# Patient Record
Sex: Female | Born: 1955
Health system: Southern US, Community
[De-identification: ages and names within clinical notes are randomized; demographics above are authoritative.]

## PROBLEM LIST (undated history)

## (undated) DIAGNOSIS — E11319 Type 2 diabetes mellitus with unspecified diabetic retinopathy without macular edema: Secondary | ICD-10-CM

## (undated) DIAGNOSIS — E7800 Pure hypercholesterolemia, unspecified: Secondary | ICD-10-CM

## (undated) DIAGNOSIS — I10 Essential (primary) hypertension: Secondary | ICD-10-CM

## (undated) HISTORY — PX: BREAST CYST EXCISION: SHX579

## (undated) HISTORY — DX: Type 2 diabetes mellitus with unspecified diabetic retinopathy without macular edema: E11.319

## (undated) HISTORY — DX: Pure hypercholesterolemia, unspecified: E78.00

## (undated) HISTORY — PX: TUBAL LIGATION: SHX77

## (undated) HISTORY — PX: BREAST SURGERY: SHX581

## (undated) HISTORY — PX: REDUCTION MAMMAPLASTY: SUR839

## (undated) HISTORY — PX: TONSILLECTOMY: SUR1361

---

## 1998-03-25 ENCOUNTER — Encounter: Payer: Self-pay | Admitting: Emergency Medicine

## 1998-03-25 ENCOUNTER — Emergency Department (HOSPITAL_COMMUNITY): Admission: EM | Admit: 1998-03-25 | Discharge: 1998-03-25 | Payer: Self-pay | Admitting: Emergency Medicine

## 1998-10-20 ENCOUNTER — Other Ambulatory Visit: Admission: RE | Admit: 1998-10-20 | Discharge: 1998-10-20 | Payer: Self-pay

## 1998-11-14 ENCOUNTER — Ambulatory Visit (HOSPITAL_COMMUNITY): Admission: RE | Admit: 1998-11-14 | Discharge: 1998-11-14 | Payer: Self-pay | Admitting: Obstetrics and Gynecology

## 1998-11-14 ENCOUNTER — Encounter: Payer: Self-pay | Admitting: Obstetrics and Gynecology

## 1999-02-19 ENCOUNTER — Ambulatory Visit (HOSPITAL_BASED_OUTPATIENT_CLINIC_OR_DEPARTMENT_OTHER): Admission: RE | Admit: 1999-02-19 | Discharge: 1999-02-19 | Payer: Self-pay | Admitting: Specialist

## 1999-09-24 ENCOUNTER — Encounter: Admission: RE | Admit: 1999-09-24 | Discharge: 1999-12-23 | Payer: Self-pay | Admitting: Internal Medicine

## 2000-04-14 ENCOUNTER — Encounter: Admission: RE | Admit: 2000-04-14 | Discharge: 2000-04-14 | Payer: Self-pay | Admitting: Internal Medicine

## 2000-04-14 ENCOUNTER — Encounter: Payer: Self-pay | Admitting: Internal Medicine

## 2000-08-12 ENCOUNTER — Encounter: Payer: Self-pay | Admitting: Internal Medicine

## 2000-08-12 ENCOUNTER — Ambulatory Visit (HOSPITAL_COMMUNITY): Admission: RE | Admit: 2000-08-12 | Discharge: 2000-08-12 | Payer: Self-pay | Admitting: Internal Medicine

## 2000-12-11 ENCOUNTER — Encounter: Admission: RE | Admit: 2000-12-11 | Discharge: 2001-02-04 | Payer: Self-pay | Admitting: Internal Medicine

## 2001-01-05 ENCOUNTER — Other Ambulatory Visit: Admission: RE | Admit: 2001-01-05 | Discharge: 2001-01-05 | Payer: Self-pay | Admitting: Obstetrics and Gynecology

## 2001-09-14 ENCOUNTER — Encounter: Payer: Self-pay | Admitting: Internal Medicine

## 2001-09-14 ENCOUNTER — Encounter: Admission: RE | Admit: 2001-09-14 | Discharge: 2001-09-14 | Payer: Self-pay | Admitting: Internal Medicine

## 2001-11-16 ENCOUNTER — Encounter: Admission: RE | Admit: 2001-11-16 | Discharge: 2002-02-14 | Payer: Self-pay | Admitting: Internal Medicine

## 2002-01-06 ENCOUNTER — Other Ambulatory Visit: Admission: RE | Admit: 2002-01-06 | Discharge: 2002-01-06 | Payer: Self-pay | Admitting: Obstetrics and Gynecology

## 2005-08-09 ENCOUNTER — Other Ambulatory Visit: Admission: RE | Admit: 2005-08-09 | Discharge: 2005-08-09 | Payer: Self-pay | Admitting: Obstetrics and Gynecology

## 2005-08-20 ENCOUNTER — Ambulatory Visit (HOSPITAL_COMMUNITY): Admission: RE | Admit: 2005-08-20 | Discharge: 2005-08-20 | Payer: Self-pay | Admitting: Internal Medicine

## 2006-04-18 ENCOUNTER — Observation Stay (HOSPITAL_COMMUNITY): Admission: EM | Admit: 2006-04-18 | Discharge: 2006-04-19 | Payer: Self-pay | Admitting: Family Medicine

## 2006-09-28 ENCOUNTER — Emergency Department (HOSPITAL_COMMUNITY): Admission: EM | Admit: 2006-09-28 | Discharge: 2006-09-28 | Payer: Self-pay | Admitting: Emergency Medicine

## 2007-02-28 ENCOUNTER — Emergency Department (HOSPITAL_COMMUNITY): Admission: EM | Admit: 2007-02-28 | Discharge: 2007-02-28 | Payer: Self-pay | Admitting: Family Medicine

## 2007-08-20 ENCOUNTER — Emergency Department (HOSPITAL_COMMUNITY): Admission: EM | Admit: 2007-08-20 | Discharge: 2007-08-20 | Payer: Self-pay | Admitting: Family Medicine

## 2007-08-25 ENCOUNTER — Emergency Department (HOSPITAL_COMMUNITY): Admission: EM | Admit: 2007-08-25 | Discharge: 2007-08-25 | Payer: Self-pay | Admitting: Family Medicine

## 2007-09-20 IMAGING — CT CT HEAD W/O CM
1 of 2 series · 13 of 30 positions shown, 17 images · IV contrast (agent unspecified)
Comparison: 09/14/01 CT from [REDACTED].

CLINICAL DATA: Hypertension.  Headache. 
HEAD CT WITHOUT CONTRAST:
TECHNIQUE: Contiguous axial images were obtained from the base of the skull through the vertex according to standard protocol without contrast.

[Series 2: brain · axial · 0.47mm/px · z∈[+143,+275]mm · 13 of 32 slices shown, 17 images]
[im 3/32  brain]
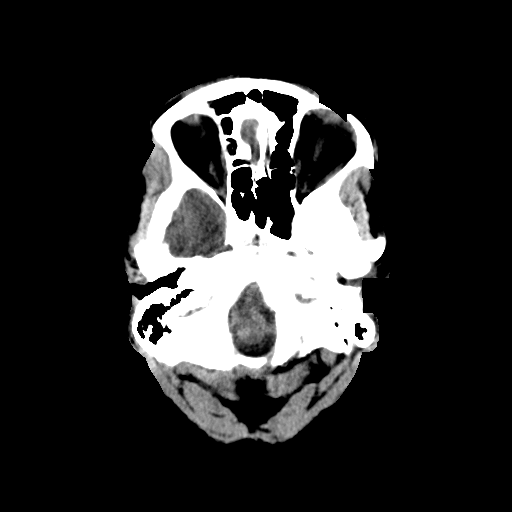
[im 3/32  bone]
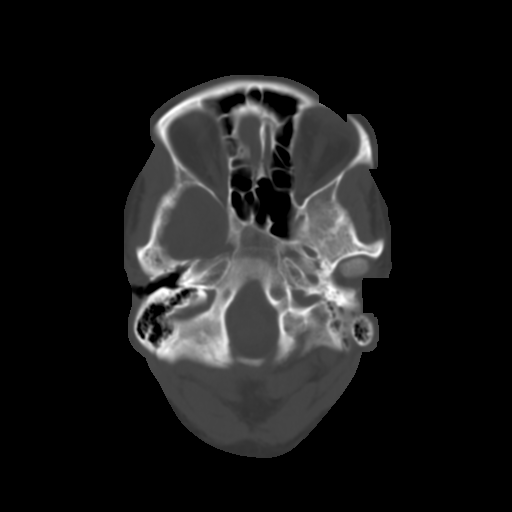
[im 5/32  brain]
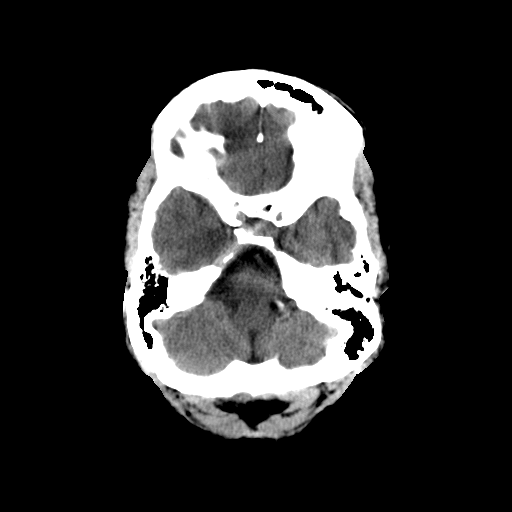
[im 7/32  brain]
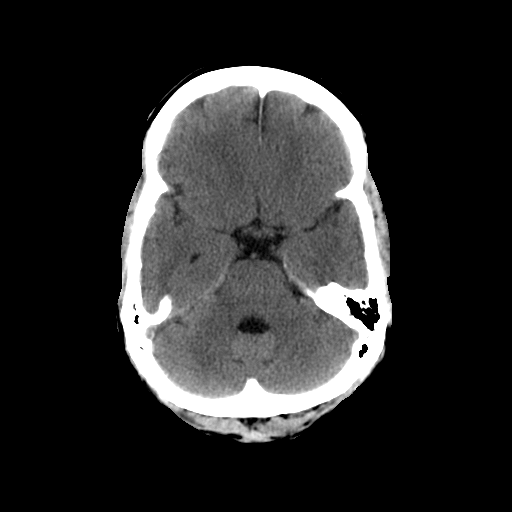
[im 9/32  brain]
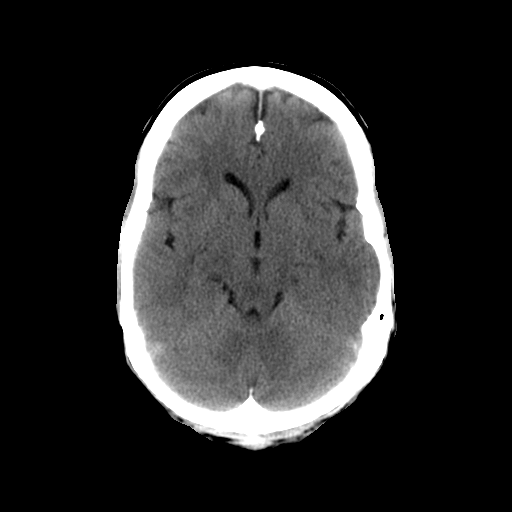
[im 12/32  brain]
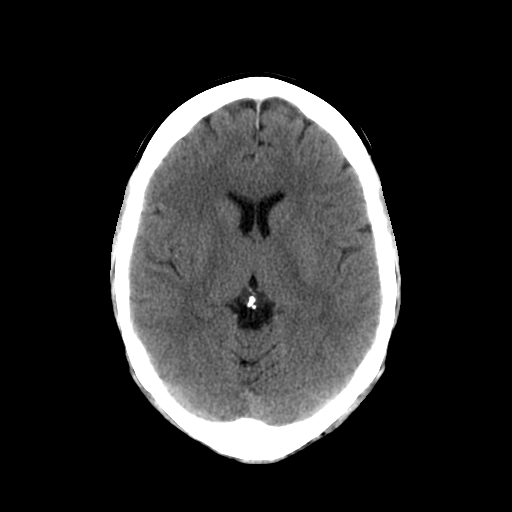
[im 12/32  bone]
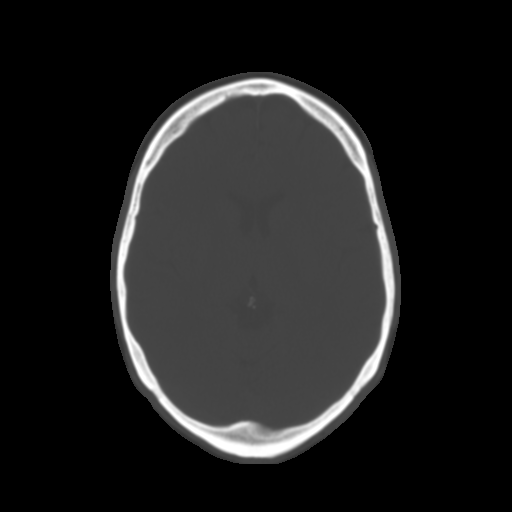
[im 14/32  brain]
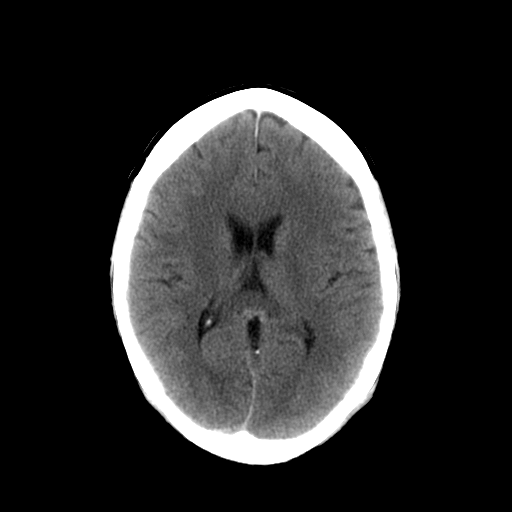
[im 16/32  brain]
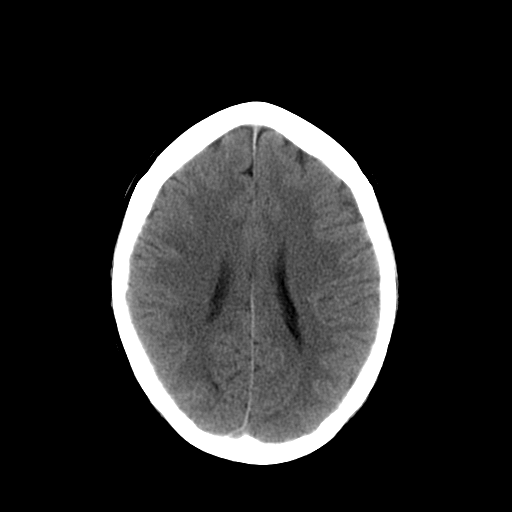
[im 18/32  brain]
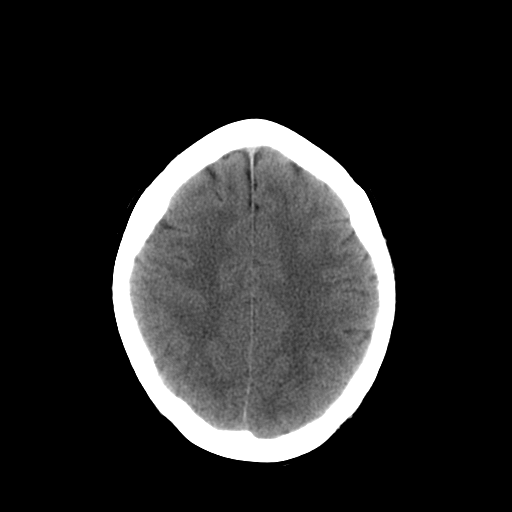
[im 20/32  brain]
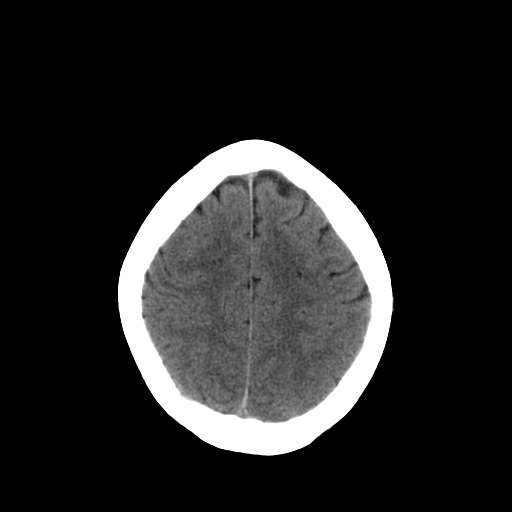
[im 20/32  bone]
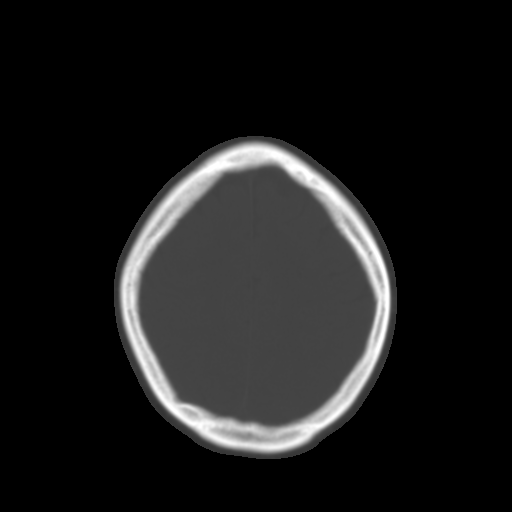
[im 23/32  brain]
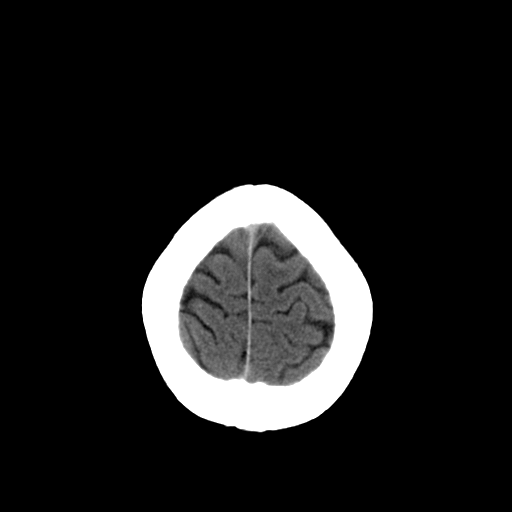
[im 25/32  brain]
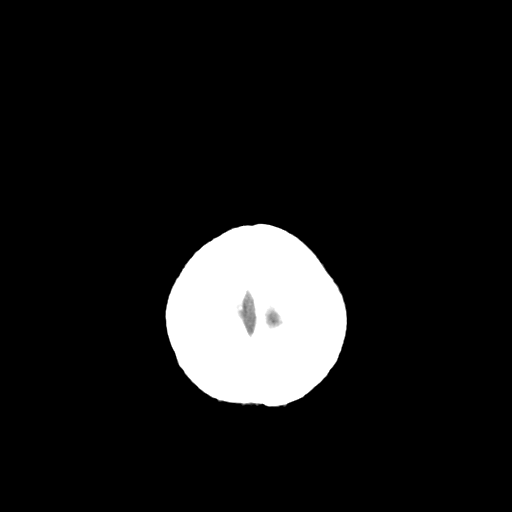
[im 27/32  brain]
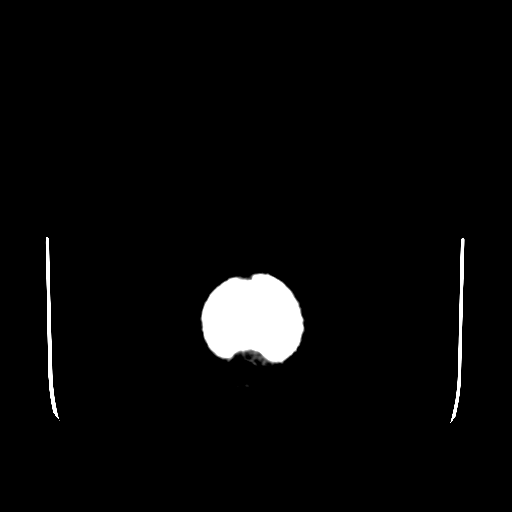
[im 29/32  brain]
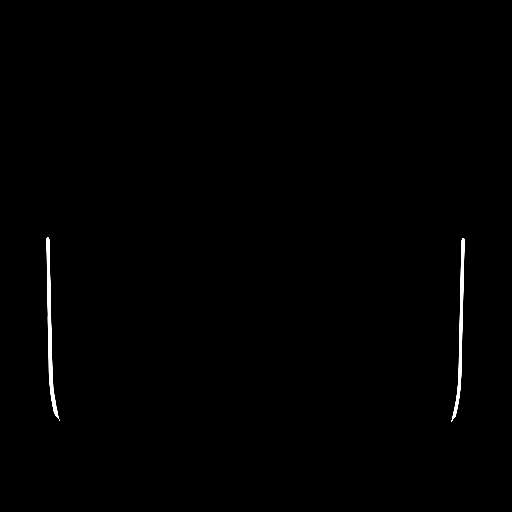
[im 29/32  bone]
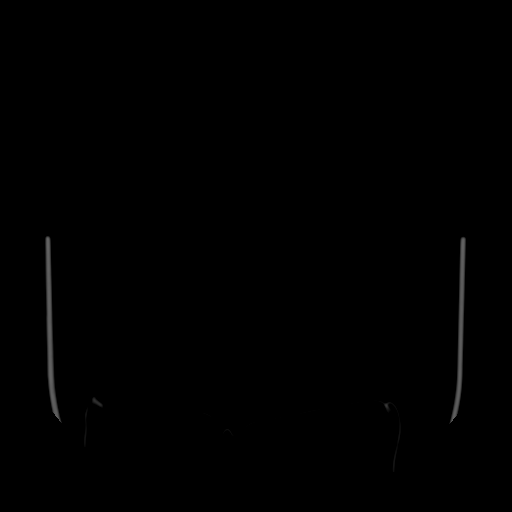

[13 of 30 positions shown; findings below may reference images not displayed]

FINDINGS: There is no evidence of intracranial hemorrhage, brain edema, acute infarct, mass lesion, or mass effect.  No other intra-axial abnormalities are seen, and the ventricles are within normal limits.  No abnormal extra-axial fluid collections or masses are identified.  No skull abnormalities are noted.
IMPRESSION: Negative non-contrast head CT.

## 2008-02-08 ENCOUNTER — Emergency Department (HOSPITAL_COMMUNITY): Admission: EM | Admit: 2008-02-08 | Discharge: 2008-02-08 | Payer: Self-pay | Admitting: Emergency Medicine

## 2008-02-08 ENCOUNTER — Ambulatory Visit (HOSPITAL_COMMUNITY): Admission: RE | Admit: 2008-02-08 | Discharge: 2008-02-08 | Payer: Self-pay | Admitting: Emergency Medicine

## 2008-11-17 ENCOUNTER — Ambulatory Visit (HOSPITAL_COMMUNITY): Admission: RE | Admit: 2008-11-17 | Discharge: 2008-11-17 | Payer: Self-pay | Admitting: Internal Medicine

## 2009-03-20 ENCOUNTER — Emergency Department (HOSPITAL_COMMUNITY): Admission: EM | Admit: 2009-03-20 | Discharge: 2009-03-20 | Payer: Self-pay | Admitting: Family Medicine

## 2009-03-21 ENCOUNTER — Emergency Department (HOSPITAL_COMMUNITY): Admission: EM | Admit: 2009-03-21 | Discharge: 2009-03-21 | Payer: Self-pay | Admitting: Emergency Medicine

## 2009-03-23 ENCOUNTER — Emergency Department (HOSPITAL_COMMUNITY): Admission: EM | Admit: 2009-03-23 | Discharge: 2009-03-23 | Payer: Self-pay | Admitting: Emergency Medicine

## 2010-07-29 ENCOUNTER — Encounter: Payer: Self-pay | Admitting: Obstetrics and Gynecology

## 2010-10-12 LAB — CULTURE, ROUTINE-ABSCESS: Gram Stain: NONE SEEN

## 2010-11-23 NOTE — H&P (Signed)
Sharon Mcdonald, Sharon Mcdonald NO.:  [id number]   MEDICAL RECORD NO.:  [id number]          PATIENT TYPE:  EMS   LOCATION:  [facility]                         FACILITY:  [facility]   PHYSICIAN:  Michaelyn Barter, M.D. DATE OF BIRTH:  05-05-1956   DATE OF ADMISSION:  04/18/2006  DATE OF DISCHARGE:                                HISTORY & PHYSICAL   PRIMARY CARE PHYSICIAN:  Robyn N. Allyne Gee, M.D.   CHIEF COMPLAINT:  Chest pain and elevated blood pressure.   HISTORY OF PRESENT ILLNESS:  Sharon Mcdonald is a 55 year old female with a past  medical history of diabetes mellitus who states that at approximately 9:00  a.m. while at work today and while sitting at her desk, she began to feel  ill.  She went on to state that she actually went to see her primary care  physician this past Monday for a checkup.  At that time, her blood pressure  was found to be elevated.  She was started on lisinopril.  She also  developed a headache at that particular time.  Since Monday, she has  experienced a headache each day.  She began to take Aleve on Wednesday as a  result.  This morning, while at work at approximately 9:00 in the morning,  she began to feel nauseated and had a headache.  In addition, she developed  left-sided pain underneath her left breast which also involved her left  shoulder and the left back region.  Her blood pressure was checked and was  found to be elevated at 177/108.  She called her primary care physician and  was referred to [facility].  After arriving at [facility] and being evaluated, she was sent to the ER for further workup.  The  patient describes her left-sided chest pain/pain underneath her breast as  being a dull sensation, approximately 3/10.  She has never had similar pain  before.  Movement aggravates the pain.  Sublingual nitroglycerin was given  to her in an the ER, and it helped to cause her pain to subside.  She also  complains of some  diaphoresis during the episode.  She currently states that  the pain has resolved since being given medication in the ER.  She denies  having any shortness of breath.  No symptoms suggestive of orthopnea or PND.  She has never had any type of cardiac evaluation, never had any stress test  completed.   PAST MEDICAL HISTORY:  1. Diabetes mellitus type 2, diagnosed in 2002.  2. The patient denies ever being told that she had hypertension.  However,      she was started on lisinopril this past Monday.  Therefore, it appears      that she may be a newly diagnosed hypertensive patient.   PAST SURGICAL HISTORY:  1. Hysterectomy secondary to fibroids approximately 15 years ago.  2. Left breast tumor excised from the left side, found to be benign.  3. Right small toe corn removed.   ALLERGIES:  PENICILLIN causes a rash.   HOME MEDICATIONS:  1. Actos plus metformin  15/850.  2. Lisinopril.  She started taking this Monday.   SOCIAL HISTORY:  Cigarettes:  The patient stopped smoking at the age of 55.  She started smoking at the age of 48.  She had smoked 3-4 sticks per day.  Alcohol:  She stopped using at the age of 82.  She drank only occasionally  on the weekends prior to that.  Powder cocaine:  The patient states that she  started using cocaine while in her 26s.  She has not used any cocaine for at  least 15 years.  Employment:  The patient works as an Runner, broadcasting/film/video  at [employer].   FAMILY HISTORY:  Mother deceased.  She had a history of diabetes mellitus  and hypertension.  Father:  Medical history is unknown.  There is no family  history of heart disease.   REVIEW OF SYSTEMS:  Positive headache.  Negative cough.  No abdominal pain,  no dysuria, no hematuria.  Negative bowel movement changes.   PHYSICAL EXAMINATION:  GENERAL:  The patient is awake, cooperative.  She is  in no obvious distress.  VITAL SIGNS:  Blood pressure at the time of admission was  187/123,  temperature 98.5, heart rate 96, respirations 12, O2 saturation 100%.  HEENT:  Normocephalic, atraumatic.  Anicteric.  Extraocular movements are  intact.  Pupils are equally reactive to light.  NECK:  Supple.  No JVD.  Strong carotids bilaterally.  No bruits.  No  lymphadenopathy.  Thyroid not palpable.  CARDIAC:  S1, S2 present.  Regular rate and rhythm.  No murmurs, no gallops,  no rubs.  RESPIRATORY:  No crackles or wheezes.  ABDOMEN:  Soft, nontender, nondistended.  Positive bowel sounds x4  quadrants.  No masses.  EXTREMITIES:  No leg edema.  NEUROLOGIC:  The patient is alert and oriented x3.  Cranial nerves II-XII  are intact.  MUSCULOSKELETAL:  5/5 upper and lower extremity strength.   LABORATORIES:  pH 7.354, PCO2 40.8, bicarbonate 22.7.  Hemoglobin 13.3,  hematocrit 39.  D-dimer is less than 0.22.  CK-MB, POC less than 1 x3.  Troponin I, POC less than 0.05 x3.  Myoglobin, POC 28, 32.2, 28.4.  EKG  reveals normal sinus rhythm, no Q waves, no ST segment changes.   ASSESSMENT AND PLAN:  1. Chest pain.  The etiology is cardiac versus noncardiac.  The patient's      initial workup thus far is negative with regard to the patient's EKG      and her point of care markers.  The patient's blood pressure was      significantly elevated.  This may have contributed to her symptoms of      chest pain.  She does, however, have cardiac risk factors which are      multiple, including obesity, a remote history of cigarette smoking,      diabetes mellitus, and what appears to be newly diagnosed hypertension      which was uncontrolled.  Will cycle the patient's cardiac enzymes.      Will start aspirin therapy, check a 2D echocardiogram.  Will also      consider consultation with cardiology.  The patient may benefit from a      stress test, and this may be done either as an inpatient or outpatient.     In addition, we will check a fasting lipid profile.  Will check a chest       x-ray.  2. Uncontrolled hypertension.  The patient denies having a prior formal      diagnosis of hypertension.  However, I suspect that this may have been      present for a while.  The patient was started on lisinopril only 4 days      ago.  Will add a beta blocker to this.  3. Left side shoulder pain.  This could have been associated with the      chest pain that the patient had.  Will check an x-ray of the left      shoulder.  4. Gastrointestinal prophylaxis.  Will provide Protonix.     Michaelyn Barter, M.D.  Electronically Signed    OR/MEDQ  D:  04/18/2006  T:  04/18/2006  Job:  [id number]

## 2011-05-29 ENCOUNTER — Encounter: Payer: Self-pay | Admitting: *Deleted

## 2011-05-29 ENCOUNTER — Emergency Department (HOSPITAL_COMMUNITY)
Admission: EM | Admit: 2011-05-29 | Discharge: 2011-05-29 | Disposition: A | Discharge status: Home / Self Care | Payer: 59 | Source: Home / Self Care | Attending: Family Medicine | Admitting: Family Medicine

## 2011-05-29 ENCOUNTER — Emergency Department (INDEPENDENT_AMBULATORY_CARE_PROVIDER_SITE_OTHER): Payer: 59

## 2011-05-29 DIAGNOSIS — M79674 Pain in right toe(s): Secondary | ICD-10-CM

## 2011-05-29 DIAGNOSIS — M12879 Other specific arthropathies, not elsewhere classified, unspecified ankle and foot: Secondary | ICD-10-CM

## 2011-05-29 DIAGNOSIS — M79609 Pain in unspecified limb: Secondary | ICD-10-CM

## 2011-05-29 HISTORY — DX: Essential (primary) hypertension: I10

## 2011-05-29 MED ORDER — NAPROXEN 500 MG PO TABS: 500.0000 mg | ORAL_TABLET | Freq: Two times a day (BID) | ORAL | Status: AC

## 2011-05-29 MED ORDER — HYDROCODONE-ACETAMINOPHEN 5-325 MG PO TABS: ORAL_TABLET | ORAL | Status: AC

## 2011-05-29 NOTE — ED Provider Notes (Signed)
History     CSN: [id number] Arrival date & time: 05/29/2011 11:58 AM   First MD Initiated Contact with Patient 05/29/11 1203      Chief Complaint  Patient presents with  . Foot Pain    (Consider location/radiation/quality/duration/timing/severity/associated sxs/prior treatment) HPI Comments: Sharon Mcdonald presents for evaluation of acute onset of pain in her RIGHT GREAT toe, over the last week. She denies any injury or hx of gout. She denies any new shoes or activity. It is tender around the 1st MTP joint; it is not red, swollen, or hot.   Patient is a 55 y.o. female presenting with lower extremity pain. The history is provided by the patient.  Foot Pain This is a new problem. The current episode started more than 1 week ago. The problem occurs constantly. The problem has been gradually worsening. The symptoms are aggravated by walking, exertion and standing. The symptoms are relieved by nothing. She has tried nothing for the symptoms.    Past Medical History  Diagnosis Date  . Diabetes mellitus   . Hypertension     Past Surgical History  Procedure Date  . Tubal ligation   . Breast surgery   . Tonsillectomy     Family History  Problem Relation Age of Onset  . Diabetes Mother   . Hypertension Mother     History  Substance Use Topics  . Smoking status: Never Smoker   . Smokeless tobacco: Not on file  . Alcohol Use: No    OB History    Grav Para Term Preterm Abortions TAB SAB Ect Mult Living                  Review of Systems  Constitutional: Negative.   HENT: Negative.   Eyes: Negative.   Respiratory: Negative.   Cardiovascular: Negative.   Gastrointestinal: Negative.   Musculoskeletal: Positive for arthralgias and gait problem.  Skin: Negative.     Allergies  Penicillins  Home Medications   Current Outpatient Rx  Name Route Sig Dispense Refill  . INSULIN DETEMIR 100 UNIT/ML [place] SOLN Subcutaneous Inject 43 Units into the skin at bedtime.      Marland Kitchen  LISINOPRIL 5 MG PO TABS Oral Take 5 mg by mouth daily.        BP 160/100  Pulse 105  Temp(Src) 97.8 F (36.6 C) (Oral)  Resp 18  SpO2 97%  Physical Exam  Constitutional: She is oriented to person, place, and time. She appears well-developed and well-nourished.  HENT:  Head: Normocephalic and atraumatic.  Eyes: EOM are normal.  Neck: Normal range of motion.  Musculoskeletal:       Feet:  Neurological: She is alert and oriented to person, place, and time.  Skin: Skin is warm and dry.    ED Course  Procedures (including critical care time)   Labs Reviewed  URIC ACID   Dg Foot Complete Right  05/29/2011  *RADIOLOGY REPORT*  Clinical Data: Pain first MTP joint.  RIGHT FOOT COMPLETE - 3+ VIEW  Comparison: None.  Findings: Three views of the right foot were obtained.  There is mild joint space narrowing and subchondral sclerosis involving the first MTP joint.  No evidence for acute fracture or dislocation.  IMPRESSION: No acute bony abnormality to the right foot.  Mild degenerative changes at the first MTP joint.  Original Report Authenticated By: Richarda Overlie, M.D.     1. Arthropathy, other, ankle/foot   2. Pain in toe of right foot  MDM  RIGHT foot xray: no acute findings        Richardo Priest, MD 05/29/11 1335

## 2011-05-29 NOTE — ED Notes (Signed)
Pt  Reports  Pain  Swelling  r  Big  Toe      Started  Last  Pm       No  Other  Symptoms      denys  Any  Injury

## 2012-07-16 ENCOUNTER — Other Ambulatory Visit (HOSPITAL_COMMUNITY): Payer: Self-pay | Admitting: Internal Medicine

## 2012-07-16 DIAGNOSIS — Z1231 Encounter for screening mammogram for malignant neoplasm of breast: Secondary | ICD-10-CM

## 2012-07-29 ENCOUNTER — Ambulatory Visit (HOSPITAL_COMMUNITY): Payer: 59

## 2012-08-05 ENCOUNTER — Ambulatory Visit (HOSPITAL_COMMUNITY): Payer: 59

## 2012-08-13 ENCOUNTER — Ambulatory Visit (HOSPITAL_COMMUNITY)
Admission: RE | Admit: 2012-08-13 | Discharge: 2012-08-13 | Disposition: A | Discharge status: Home / Self Care | Payer: 59 | Source: Ambulatory Visit | Attending: Internal Medicine | Admitting: Internal Medicine

## 2012-08-13 DIAGNOSIS — Z1231 Encounter for screening mammogram for malignant neoplasm of breast: Secondary | ICD-10-CM | POA: Insufficient documentation

## 2014-05-11 ENCOUNTER — Encounter (INDEPENDENT_AMBULATORY_CARE_PROVIDER_SITE_OTHER): Payer: 59 | Admitting: Ophthalmology

## 2014-05-11 DIAGNOSIS — I10 Essential (primary) hypertension: Secondary | ICD-10-CM

## 2014-05-11 DIAGNOSIS — H2513 Age-related nuclear cataract, bilateral: Secondary | ICD-10-CM

## 2014-05-11 DIAGNOSIS — H43813 Vitreous degeneration, bilateral: Secondary | ICD-10-CM

## 2014-05-11 DIAGNOSIS — E11341 Type 2 diabetes mellitus with severe nonproliferative diabetic retinopathy with macular edema: Secondary | ICD-10-CM

## 2014-05-11 DIAGNOSIS — H35033 Hypertensive retinopathy, bilateral: Secondary | ICD-10-CM

## 2014-05-11 DIAGNOSIS — E11311 Type 2 diabetes mellitus with unspecified diabetic retinopathy with macular edema: Secondary | ICD-10-CM

## 2014-05-30 ENCOUNTER — Encounter (INDEPENDENT_AMBULATORY_CARE_PROVIDER_SITE_OTHER): Payer: 59 | Admitting: Ophthalmology

## 2014-06-01 ENCOUNTER — Encounter (INDEPENDENT_AMBULATORY_CARE_PROVIDER_SITE_OTHER): Payer: 59 | Admitting: Ophthalmology

## 2014-10-03 ENCOUNTER — Encounter: Payer: Self-pay | Admitting: Podiatry

## 2014-10-03 ENCOUNTER — Ambulatory Visit (INDEPENDENT_AMBULATORY_CARE_PROVIDER_SITE_OTHER): Payer: 59 | Admitting: Podiatry

## 2014-10-03 ENCOUNTER — Ambulatory Visit (INDEPENDENT_AMBULATORY_CARE_PROVIDER_SITE_OTHER): Payer: 59

## 2014-10-03 VITALS — BP 168/92 | HR 96 | Resp 12

## 2014-10-03 DIAGNOSIS — S90121A Contusion of right lesser toe(s) without damage to nail, initial encounter: Secondary | ICD-10-CM | POA: Diagnosis not present

## 2014-10-03 DIAGNOSIS — R52 Pain, unspecified: Secondary | ICD-10-CM

## 2014-10-03 DIAGNOSIS — E1021 Type 1 diabetes mellitus with diabetic nephropathy: Secondary | ICD-10-CM | POA: Diagnosis not present

## 2014-10-03 DIAGNOSIS — Q828 Other specified congenital malformations of skin: Secondary | ICD-10-CM | POA: Diagnosis not present

## 2014-10-03 DIAGNOSIS — L84 Corns and callosities: Secondary | ICD-10-CM

## 2014-10-03 NOTE — Patient Instructions (Signed)
Diabetes and Foot Care Diabetes may cause you to have problems because of poor blood supply (circulation) to your feet and legs. This may cause the skin on your feet to become thinner, break easier, and heal more slowly. Your skin may become dry, and the skin may peel and crack. You may also have nerve damage in your legs and feet causing decreased feeling in them. You may not notice minor injuries to your feet that could lead to infections or more serious problems. Taking care of your feet is one of the most important things you can do for yourself.  HOME CARE INSTRUCTIONS  Wear shoes at all times, even in the house. Do not go barefoot. Bare feet are easily injured.  Check your feet daily for blisters, cuts, and redness. If you cannot see the bottom of your feet, use a mirror or ask someone for help.  Wash your feet with warm water (do not use hot water) and mild soap. Then pat your feet and the areas between your toes until they are completely dry. Do not soak your feet as this can dry your skin.  Apply a moisturizing lotion or petroleum jelly (that does not contain alcohol and is unscented) to the skin on your feet and to dry, brittle toenails. Do not apply lotion between your toes.  Trim your toenails straight across. Do not dig under them or around the cuticle. File the edges of your nails with an emery board or nail file.  Do not cut corns or calluses or try to remove them with medicine.  Wear clean socks or stockings every day. Make sure they are not too tight. Do not wear knee-high stockings since they may decrease blood flow to your legs.  Wear shoes that fit properly and have enough cushioning. To break in new shoes, wear them for just a few hours a day. This prevents you from injuring your feet. Always look in your shoes before you put them on to be sure there are no objects inside.  Do not cross your legs. This may decrease the blood flow to your feet.  If you find a minor scrape,  cut, or break in the skin on your feet, keep it and the skin around it clean and dry. These areas may be cleansed with mild soap and water. Do not cleanse the area with peroxide, alcohol, or iodine.  When you remove an adhesive bandage, be sure not to damage the skin around it.  If you have a wound, look at it several times a day to make sure it is healing.  Do not use heating pads or hot water bottles. They may burn your skin. If you have lost feeling in your feet or legs, you may not know it is happening until it is too late.  Make sure your health care provider performs a complete foot exam at least annually or more often if you have foot problems. Report any cuts, sores, or bruises to your health care provider immediately. SEEK MEDICAL CARE IF:   You have an injury that is not healing.  You have cuts or breaks in the skin.  You have an ingrown nail.  You notice redness on your legs or feet.  You feel burning or tingling in your legs or feet.  You have pain or cramps in your legs and feet.  Your legs or feet are numb.  Your feet always feel cold. SEEK IMMEDIATE MEDICAL CARE IF:   There is increasing redness,   swelling, or pain in or around a wound.  There is a red line that goes up your leg.  Pus is coming from a wound.  You develop a fever or as directed by your health care provider.  You notice a bad smell coming from an ulcer or wound. Document Released: 06/21/2000 Document Revised: 02/24/2013 Document Reviewed: 12/01/2012 Exit[employer] Patient Information 2015 [employer]. This information is not intended to replace advice given to you by your health care provider. Make sure you discuss any questions you have with your health care provider.  

## 2014-10-03 NOTE — Progress Notes (Signed)
   Subjective:    Patient ID: Sharon Mcdonald, female    DOB: 31-May-1956, 59 y.o.   MRN: [id number]  HPI  N-PAINFUL L-RT FOOT 3RD TOE D-3 WEEKS O-SUDDENLY C-SAME A-SHOES T-N/A  She describes striking right foot against stool approximately 3 weeks ago with persistence of pain in the third right toe.  She denies any history of ulceration or claudication  Patient is insulin controlled diabetic   Review of Systems  Eyes: Positive for visual disturbance.  All other systems reviewed and are negative.      Objective:   Physical Exam  Orientated 3  Vascular: DP pulses 2/4 bilaterally PT pulses 2/4 bilaterally Capillary reflex immediate bilaterally  Neurological: Ankle reflexes equal and reactive bilaterally Vibratory sensation intact laterally Sensation to 10 g monofilament wire intact 5/5 bilaterally  Dermatological: Faint surgical scar fifth right toe Keratoses lateral third right toe is tender to direct palpation   Musculoskeletal: Pes planus bilaterally Palpation third right toe elicits discomfort in the lateral keratoses on the toe No restriction motion in the interphalangeal joints are MPJ of the third right toe    X-ray examination right foot  Intact bony structure without a fracture and/or dislocation Surgical resection head a proximal phalanx fifth toe  Radiographic impression: No acute bony abnormality noted in the right foot         Assessment & Plan:   Assessment: Satisfactory neurovascular status Contusion third right toe Keratoses third right toe  Plan: Advised patient there was no fracture or dislocation noted in the third right toe I debrided the keratoses on the third right toe and advised patient that most likely the lesion would reoccur and to return as needed or yearly for foot examinations

## 2015-07-18 ENCOUNTER — Telehealth: Payer: Self-pay | Admitting: *Deleted

## 2015-07-18 ENCOUNTER — Ambulatory Visit (INDEPENDENT_AMBULATORY_CARE_PROVIDER_SITE_OTHER): Payer: Commercial Managed Care - HMO

## 2015-07-18 ENCOUNTER — Encounter: Payer: Self-pay | Admitting: Podiatry

## 2015-07-18 ENCOUNTER — Ambulatory Visit (INDEPENDENT_AMBULATORY_CARE_PROVIDER_SITE_OTHER): Payer: Commercial Managed Care - HMO | Admitting: Podiatry

## 2015-07-18 VITALS — BP 121/65 | HR 103 | Resp 18

## 2015-07-18 DIAGNOSIS — R52 Pain, unspecified: Secondary | ICD-10-CM | POA: Diagnosis not present

## 2015-07-18 DIAGNOSIS — S92911A Unspecified fracture of right toe(s), initial encounter for closed fracture: Secondary | ICD-10-CM

## 2015-07-18 MED ORDER — TRAMADOL HCL 50 MG PO TABS: 50.0000 mg | ORAL_TABLET | Freq: Three times a day (TID) | ORAL | Status: DC | PRN

## 2015-07-18 NOTE — Telephone Encounter (Signed)
Ms Sharon Mcdonald states she has had an injury to her right foot, and she wanted to know if she needed an appt.  I left a message encouraging pt to make an appt, because if she was concerned enough to call for first aid instructions then she needed to be seen in our office, urgent care or PCP office.  I told her until she was seen she could follow basic first aid protocol - rest, ice, and elevation.

## 2015-07-18 NOTE — Patient Instructions (Signed)
Toe Fracture °A toe fracture is a break in one of the toe bones (phalanges). °CAUSES °This condition may be caused by: °· Dropping a heavy object on your toe. °· Stubbing your toe. °· Overusing your toe or doing repetitive exercise. °· Twisting or stretching your toe out of place. °RISK FACTORS °This condition is more likely to develop in people who: °· Play contact sports. °· Have a bone disease. °· Have a low calcium level. °SYMPTOMS °The main symptoms of this condition are swelling and pain in the toe. The pain may get worse with standing or walking. Other symptoms include: °· Bruising. °· Stiffness. °· Numbness. °· A change in the way the toe looks. °· Broken bones that poke through the skin. °· Blood beneath the toenail. °DIAGNOSIS °This condition is diagnosed with a physical exam. You may also have X-rays. °TREATMENT  °Treatment for this condition depends on the type of fracture and its severity. Treatment may involve: °· Taping the broken toe to a toe that is next to it (buddy taping). This is the most common treatment for fractures in which the bone has not moved out of place (nondisplaced fracture). °· Wearing a shoe that has a wide, rigid sole to protect the toe and to limit its movement. °· Wearing a walking cast. °· Having a procedure to move the toe back into place. °· Surgery. This may be needed: °¨ If there are many pieces of broken bone that are out of place (displaced). °¨ If the toe joint breaks. °¨ If the bone breaks through the skin. °· Physical therapy. This is done to help regain movement and strength in the toe. °You may need follow-up X-rays to make sure that the bone is healing well and staying in position. °HOME CARE INSTRUCTIONS °If You Have a Cast: °· Do not stick anything inside the cast to scratch your skin. Doing that increases your risk of infection. °· Check the skin around the cast every day. Report any concerns to your health care provider. You may put lotion on dry skin around the  edges of the cast. Do not apply lotion to the skin underneath the cast. °· Do not put pressure on any part of the cast until it is fully hardened. This may take several hours. °· Keep the cast clean and dry. °Bathing °· Do not take baths, swim, or use a hot tub until your health care provider approves. Ask your health care provider if you can take showers. You may only be allowed to take sponge baths for bathing. °· If your health care provider approves bathing and showering, cover the cast or bandage (dressing) with a watertight plastic bag to protect it from water. Do not let the cast or dressing get wet. °Managing Pain, Stiffness, and Swelling °· If you do not have a cast, apply ice to the injured area, if directed. °¨ Put ice in a plastic bag. °¨ Place a towel between your skin and the bag. °¨ Leave the ice on for 20 minutes, 2-3 times per day. °· Move your toes often to avoid stiffness and to lessen swelling. °· Raise (elevate) the injured area above the level of your heart while you are sitting or lying down. °Driving °· Do not drive or operate heavy machinery while taking pain medicine. °· Do not drive while wearing a cast on a foot that you use for driving. °Activity °· Return to your normal activities as directed by your health care provider. Ask your health care   provider what activities are safe for you. °· Perform exercises daily as directed by your health care provider or physical therapist. °Safety °· Do not use the injured limb to support your body weight until your health care provider says that you can. Use crutches or other assistive devices as directed by your health care provider. °General Instructions °· If your toe was treated with buddy taping, follow your health care provider's instructions for changing the gauze and tape. Change it more often: °¨ The gauze and tape get wet. If this happens, dry the space between the toes. °¨ The gauze and tape are too tight and cause your toe to become pale  or numb. °· Wear a protective shoe as directed by your health care provider. If you were not given a protective shoe, wear sturdy, supportive shoes. Your shoes should not pinch your toes and should not fit tightly against your toes. °· Do not use any tobacco products, including cigarettes, chewing tobacco, or e-cigarettes. Tobacco can delay bone healing. If you need help quitting, ask your health care provider. °· Take medicines only as directed by your health care provider. °· Keep all follow-up visits as directed by your health care provider. This is important. °SEEK MEDICAL CARE IF: °· You have a fever. °· Your pain medicine is not helping. °· Your toe is cold. °· Your toe is numb. °· You still have pain after one week of rest and treatment. °· You still have pain after your health care provider has said that you can start walking again. °· You have pain, tingling, or numbness in your foot that is not going away. °SEEK IMMEDIATE MEDICAL CARE IF: °· You have severe pain. °· You have redness or inflammation in your toe that is getting worse. °· You have pain or numbness in your toe that is getting worse. °· Your toe turns blue. °  °This information is not intended to replace advice given to you by your health care provider. Make sure you discuss any questions you have with your health care provider. °  °Document Released: 06/21/2000 Document Revised: 03/15/2015 Document Reviewed: 04/20/2014 °[employer]vier Interactive Patient Education ©2016 [employer] ° °
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
## 2015-07-25 ENCOUNTER — Ambulatory Visit: Payer: Commercial Managed Care - HMO | Admitting: *Deleted

## 2015-07-25 ENCOUNTER — Telehealth: Payer: Self-pay | Admitting: *Deleted

## 2015-07-25 NOTE — Telephone Encounter (Signed)
Pt states she would like someone to wrap her broken toe, she wraps it too tight.  I called pt and transferred to schedulers.

## 2015-08-08 ENCOUNTER — Ambulatory Visit (INDEPENDENT_AMBULATORY_CARE_PROVIDER_SITE_OTHER): Payer: Commercial Managed Care - HMO

## 2015-08-08 ENCOUNTER — Encounter: Payer: Self-pay | Admitting: Podiatry

## 2015-08-08 ENCOUNTER — Ambulatory Visit (INDEPENDENT_AMBULATORY_CARE_PROVIDER_SITE_OTHER): Payer: Commercial Managed Care - HMO | Admitting: Podiatry

## 2015-08-08 VITALS — BP 123/78 | HR 112 | Resp 16

## 2015-08-08 DIAGNOSIS — S92911D Unspecified fracture of right toe(s), subsequent encounter for fracture with routine healing: Secondary | ICD-10-CM

## 2015-08-08 DIAGNOSIS — M79674 Pain in right toe(s): Secondary | ICD-10-CM

## 2015-08-08 NOTE — Progress Notes (Signed)
Patient ID: Sharon Mcdonald, female   DOB: 17-Dec-1955, 60 y.o.   MRN: [id number]  Subjective: She presents today for follow-up evaluation of fracture to the right second toe. She's had that she continues gets some intermittent discomfort although it feels substantially better than what it was previously. She does continue to take the toes. No other complaints and no acute changes and possible him. No recent injury or trauma since last appointment.  Objective: AAO x3, NAD DP/PT pulses palpable bilaterally, CRT less than 3 seconds Protective sensation intact with Simms Weinstein monofilament There is decreased tenderness to palpation of the base of the right second toe although it appears to be improved. There is no pain in the metatarsals. The edema has decreased. There is no MPJ range of motion. No other areas of pinpoint bony tenderness or pain to vibratory sensation bilateral lower extremities. MMT 5/5, ROM WNL. No edema, erythema, increase in warmth to bilateral lower extremities.  No open lesions or pre-ulcerative lesions.  No pain with calf compression, swelling, warmth, erythema  Assessment: 60 year old female right second toe fracture; healing  Plan: -All treatment options discussed with the patient including all alternatives, risks, complications.  -X-rays were obtained and reviewed with the patient. Evidence of healing on the proximal phalanx fracture the second toe on the right foot. -Continued hip the toe. Also dispensed toe splint to help immobilize the toe as well. Continue with supportive shoe gear. -If symptoms don't resolve in the next 4-6 weeks to call the office for follow-up appointment or sooner if any problems or to arise. Meantime call the office within the arch, concerns, change in symptoms.   Ovid Curd, DPM

## 2015-09-25 ENCOUNTER — Other Ambulatory Visit: Payer: Self-pay

## 2015-09-25 DIAGNOSIS — Z1231 Encounter for screening mammogram for malignant neoplasm of breast: Secondary | ICD-10-CM

## 2015-10-02 ENCOUNTER — Ambulatory Visit: Payer: 59 | Admitting: Podiatry

## 2015-10-09 ENCOUNTER — Ambulatory Visit
Admission: RE | Admit: 2015-10-09 | Discharge: 2015-10-09 | Disposition: A | Discharge status: Home / Self Care | Payer: Commercial Managed Care - HMO | Source: Ambulatory Visit

## 2015-10-09 DIAGNOSIS — Z1231 Encounter for screening mammogram for malignant neoplasm of breast: Secondary | ICD-10-CM

## 2015-10-23 ENCOUNTER — Ambulatory Visit: Payer: Commercial Managed Care - HMO | Admitting: Podiatry

## 2015-11-20 ENCOUNTER — Ambulatory Visit (INDEPENDENT_AMBULATORY_CARE_PROVIDER_SITE_OTHER): Payer: Commercial Managed Care - HMO | Admitting: Podiatry

## 2015-11-20 ENCOUNTER — Encounter: Payer: Self-pay | Admitting: Podiatry

## 2015-11-20 VITALS — BP 156/91 | HR 96 | Resp 18

## 2015-11-20 DIAGNOSIS — E119 Type 2 diabetes mellitus without complications: Secondary | ICD-10-CM | POA: Insufficient documentation

## 2015-11-20 DIAGNOSIS — M2040 Other hammer toe(s) (acquired), unspecified foot: Secondary | ICD-10-CM | POA: Diagnosis not present

## 2015-11-20 DIAGNOSIS — L84 Corns and callosities: Secondary | ICD-10-CM

## 2015-11-20 NOTE — Patient Instructions (Signed)
Diabetes and Foot Care Diabetes may cause you to have problems because of poor blood supply (circulation) to your feet and legs. This may cause the skin on your feet to become thinner, break easier, and heal more slowly. Your skin may become dry, and the skin may peel and crack. You may also have nerve damage in your legs and feet causing decreased feeling in them. You may not notice minor injuries to your feet that could lead to infections or more serious problems. Taking care of your feet is one of the most important things you can do for yourself.  HOME CARE INSTRUCTIONS  Wear shoes at all times, even in the house. Do not go barefoot. Bare feet are easily injured.  Check your feet daily for blisters, cuts, and redness. If you cannot see the bottom of your feet, use a mirror or ask someone for help.  Wash your feet with warm water (do not use hot water) and mild soap. Then pat your feet and the areas between your toes until they are completely dry. Do not soak your feet as this can dry your skin.  Apply a moisturizing lotion or petroleum jelly (that does not contain alcohol and is unscented) to the skin on your feet and to dry, brittle toenails. Do not apply lotion between your toes.  Trim your toenails straight across. Do not dig under them or around the cuticle. File the edges of your nails with an emery board or nail file.  Do not cut corns or calluses or try to remove them with medicine.  Wear clean socks or stockings every day. Make sure they are not too tight. Do not wear knee-high stockings since they may decrease blood flow to your legs.  Wear shoes that fit properly and have enough cushioning. To break in new shoes, wear them for just a few hours a day. This prevents you from injuring your feet. Always look in your shoes before you put them on to be sure there are no objects inside.  Do not cross your legs. This may decrease the blood flow to your feet.  If you find a minor scrape,  cut, or break in the skin on your feet, keep it and the skin around it clean and dry. These areas may be cleansed with mild soap and water. Do not cleanse the area with peroxide, alcohol, or iodine.  When you remove an adhesive bandage, be sure not to damage the skin around it.  If you have a wound, look at it several times a day to make sure it is healing.  Do not use heating pads or hot water bottles. They may burn your skin. If you have lost feeling in your feet or legs, you may not know it is happening until it is too late.  Make sure your health care provider performs a complete foot exam at least annually or more often if you have foot problems. Report any cuts, sores, or bruises to your health care provider immediately. SEEK MEDICAL CARE IF:   You have an injury that is not healing.  You have cuts or breaks in the skin.  You have an ingrown nail.  You notice redness on your legs or feet.  You feel burning or tingling in your legs or feet.  You have pain or cramps in your legs and feet.  Your legs or feet are numb.  Your feet always feel cold. SEEK IMMEDIATE MEDICAL CARE IF:   There is increasing redness,   swelling, or pain in or around a wound.  There is a red line that goes up your leg.  Pus is coming from a wound.  You develop a fever or as directed by your health care provider.  You notice a bad smell coming from an ulcer or wound.   This information is not intended to replace advice given to you by your health care provider. Make sure you discuss any questions you have with your health care provider.   Document Released: 06/21/2000 Document Revised: 02/24/2013 Document Reviewed: 12/01/2012 Elsevier Interactive Patient Education 2016 [employer]  

## 2015-11-20 NOTE — Progress Notes (Signed)
Patient ID: Sharon PiesYolanda B Mcdonald, female   DOB: 02-24-1956, 60 y.o.   MRN: [id number]  Subjective: 60 year old female presents the office today for yearly diabetic foot exam. She states she's having no concerns or changes to her feet. No numbness or tingling. No swelling or redness. No recent injury.Denies any systemic complaints such as fevers, chills, nausea, vomiting. No acute changes since last appointment, and no other complaints at this time.   Objective: AAO x3, NAD DP/PT pulses palpable bilaterally, CRT less than 3 seconds Protective sensation intact with Simms Weinstein monofilament, vibratory sensation intact, Achilles tendon reflex intact Mild hammertoes are present and adductovarus the fifth toes. Small hyperkeratotic lesions dorsal lateral fifth toes bilaterally into the medial second toe of the left foot. No underlying ulceration, drainage or other signs of infection. No areas of pinpoint bony tenderness or pain with vibratory sensation. MMT 5/5, ROM WNL. No edema, erythema, increase in warmth to bilateral lower extremities.  No open lesions or pre-ulcerative lesions.  No pain with calf compression, swelling, warmth, erythema  Assessment: Presents for diabetic yearly exam  Plan: -All treatment options discussed with the patient including all alternatives, risks, complications.  -Hyperkeratotic lesions debrided 3 without complications or bleeding. -Discussed daily foot inspection. Also discussed supportive shoes. -Follow-up in one years sooner if any issues are to arise. -Patient encouraged to call the office with any questions, concerns, change in symptoms.   Ovid CurdMatthew Wagoner, DPM

## 2016-07-11 DIAGNOSIS — E11311 Type 2 diabetes mellitus with unspecified diabetic retinopathy with macular edema: Secondary | ICD-10-CM | POA: Diagnosis not present

## 2016-07-11 DIAGNOSIS — I10 Essential (primary) hypertension: Secondary | ICD-10-CM | POA: Diagnosis not present

## 2016-07-11 DIAGNOSIS — J069 Acute upper respiratory infection, unspecified: Secondary | ICD-10-CM | POA: Diagnosis not present

## 2016-07-19 DIAGNOSIS — H3582 Retinal ischemia: Secondary | ICD-10-CM | POA: Diagnosis not present

## 2016-07-19 DIAGNOSIS — E113513 Type 2 diabetes mellitus with proliferative diabetic retinopathy with macular edema, bilateral: Secondary | ICD-10-CM | POA: Diagnosis not present

## 2016-07-19 DIAGNOSIS — H43811 Vitreous degeneration, right eye: Secondary | ICD-10-CM | POA: Diagnosis not present

## 2016-08-09 DIAGNOSIS — H2513 Age-related nuclear cataract, bilateral: Secondary | ICD-10-CM | POA: Diagnosis not present

## 2016-08-09 DIAGNOSIS — E113513 Type 2 diabetes mellitus with proliferative diabetic retinopathy with macular edema, bilateral: Secondary | ICD-10-CM | POA: Diagnosis not present

## 2016-08-09 DIAGNOSIS — H401131 Primary open-angle glaucoma, bilateral, mild stage: Secondary | ICD-10-CM | POA: Diagnosis not present

## 2016-08-16 DIAGNOSIS — E113513 Type 2 diabetes mellitus with proliferative diabetic retinopathy with macular edema, bilateral: Secondary | ICD-10-CM | POA: Diagnosis not present

## 2016-08-16 DIAGNOSIS — H43811 Vitreous degeneration, right eye: Secondary | ICD-10-CM | POA: Diagnosis not present

## 2016-08-16 DIAGNOSIS — H3582 Retinal ischemia: Secondary | ICD-10-CM | POA: Diagnosis not present

## 2016-09-13 DIAGNOSIS — H3582 Retinal ischemia: Secondary | ICD-10-CM | POA: Diagnosis not present

## 2016-09-13 DIAGNOSIS — H43811 Vitreous degeneration, right eye: Secondary | ICD-10-CM | POA: Diagnosis not present

## 2016-09-13 DIAGNOSIS — E113513 Type 2 diabetes mellitus with proliferative diabetic retinopathy with macular edema, bilateral: Secondary | ICD-10-CM | POA: Diagnosis not present

## 2016-09-17 DIAGNOSIS — H04122 Dry eye syndrome of left lacrimal gland: Secondary | ICD-10-CM | POA: Diagnosis not present

## 2016-09-24 DIAGNOSIS — H2513 Age-related nuclear cataract, bilateral: Secondary | ICD-10-CM | POA: Diagnosis not present

## 2016-09-24 DIAGNOSIS — H401131 Primary open-angle glaucoma, bilateral, mild stage: Secondary | ICD-10-CM | POA: Diagnosis not present

## 2016-09-24 DIAGNOSIS — E113513 Type 2 diabetes mellitus with proliferative diabetic retinopathy with macular edema, bilateral: Secondary | ICD-10-CM | POA: Diagnosis not present

## 2016-09-24 DIAGNOSIS — H2512 Age-related nuclear cataract, left eye: Secondary | ICD-10-CM | POA: Diagnosis not present

## 2016-10-10 DIAGNOSIS — H2512 Age-related nuclear cataract, left eye: Secondary | ICD-10-CM | POA: Diagnosis not present

## 2016-10-25 DIAGNOSIS — H43813 Vitreous degeneration, bilateral: Secondary | ICD-10-CM | POA: Diagnosis not present

## 2016-10-25 DIAGNOSIS — E113513 Type 2 diabetes mellitus with proliferative diabetic retinopathy with macular edema, bilateral: Secondary | ICD-10-CM | POA: Diagnosis not present

## 2016-10-25 DIAGNOSIS — H3582 Retinal ischemia: Secondary | ICD-10-CM | POA: Diagnosis not present

## 2016-11-04 DIAGNOSIS — I129 Hypertensive chronic kidney disease with stage 1 through stage 4 chronic kidney disease, or unspecified chronic kidney disease: Secondary | ICD-10-CM | POA: Diagnosis not present

## 2016-11-04 DIAGNOSIS — N181 Chronic kidney disease, stage 1: Secondary | ICD-10-CM | POA: Diagnosis not present

## 2016-11-04 DIAGNOSIS — E11311 Type 2 diabetes mellitus with unspecified diabetic retinopathy with macular edema: Secondary | ICD-10-CM | POA: Diagnosis not present

## 2016-11-18 ENCOUNTER — Ambulatory Visit (INDEPENDENT_AMBULATORY_CARE_PROVIDER_SITE_OTHER): Payer: Commercial Managed Care - HMO | Admitting: Podiatry

## 2016-11-18 DIAGNOSIS — L84 Corns and callosities: Secondary | ICD-10-CM

## 2016-11-18 DIAGNOSIS — Q828 Other specified congenital malformations of skin: Secondary | ICD-10-CM | POA: Diagnosis not present

## 2016-11-18 DIAGNOSIS — E119 Type 2 diabetes mellitus without complications: Secondary | ICD-10-CM | POA: Diagnosis not present

## 2016-11-18 NOTE — Progress Notes (Signed)
Patient ID: Jearld PiesYolanda B Cassis, female   DOB: 08-20-1955, 61 y.o.   MRN: [id number]  Subjective: 61 year old female presents the office today for yearly diabetic foot exam. She states she's having no concerns or changes to her feet other than painful calluses.  No numbness or tingling. No swelling or redness. No recent injury. Denies any systemic complaints such as fevers, chills, nausea, vomiting. No acute changes since last appointment, and no other complaints at this time.   Objective: AAO x3, NAD DP/PT pulses palpable bilaterally, CRT less than 3 seconds Protective sensation intact with Simms Weinstein monofilament Mild hammertoes are present and adductovarus the fifth toes. Small hyperkeratotic lesions dorsal lateral fifth toes bilaterally into the medial second toe of the left foot. No underlying ulceration, drainage or other signs of infection. No areas of pinpoint bony tenderness or pain with vibratory sensation. MMT 5/5, ROM WNL. No edema, erythema, increase in warmth to bilateral lower extremities.  No open lesions or pre-ulcerative lesions.  No pain with calf compression, swelling, warmth, erythema  Assessment: Presents for diabetic yearly exam  Plan: -All treatment options discussed with the patient including all alternatives, risks, complications.  -Hyperkeratotic lesions debrided 3 without complications or bleeding. -Discussed daily foot inspection. Also discussed supportive shoes. -Follow-up in one years sooner if any issues are to arise. -Patient encouraged to call the office with any questions, concerns, change in symptoms.   Ovid CurdMatthew Wagoner, DPM

## 2016-11-21 DIAGNOSIS — R0683 Snoring: Secondary | ICD-10-CM | POA: Diagnosis not present

## 2016-11-22 DIAGNOSIS — H43813 Vitreous degeneration, bilateral: Secondary | ICD-10-CM | POA: Diagnosis not present

## 2016-11-22 DIAGNOSIS — R0683 Snoring: Secondary | ICD-10-CM | POA: Diagnosis not present

## 2016-11-22 DIAGNOSIS — H3582 Retinal ischemia: Secondary | ICD-10-CM | POA: Diagnosis not present

## 2016-11-22 DIAGNOSIS — E113513 Type 2 diabetes mellitus with proliferative diabetic retinopathy with macular edema, bilateral: Secondary | ICD-10-CM | POA: Diagnosis not present

## 2016-12-20 DIAGNOSIS — H43813 Vitreous degeneration, bilateral: Secondary | ICD-10-CM | POA: Diagnosis not present

## 2016-12-20 DIAGNOSIS — E113513 Type 2 diabetes mellitus with proliferative diabetic retinopathy with macular edema, bilateral: Secondary | ICD-10-CM | POA: Diagnosis not present

## 2016-12-20 DIAGNOSIS — H3582 Retinal ischemia: Secondary | ICD-10-CM | POA: Diagnosis not present

## 2016-12-20 DIAGNOSIS — H2511 Age-related nuclear cataract, right eye: Secondary | ICD-10-CM | POA: Diagnosis not present

## 2016-12-26 DIAGNOSIS — J029 Acute pharyngitis, unspecified: Secondary | ICD-10-CM | POA: Diagnosis not present

## 2016-12-26 DIAGNOSIS — E11311 Type 2 diabetes mellitus with unspecified diabetic retinopathy with macular edema: Secondary | ICD-10-CM | POA: Diagnosis not present

## 2016-12-26 DIAGNOSIS — I129 Hypertensive chronic kidney disease with stage 1 through stage 4 chronic kidney disease, or unspecified chronic kidney disease: Secondary | ICD-10-CM | POA: Diagnosis not present

## 2017-01-02 DIAGNOSIS — M7502 Adhesive capsulitis of left shoulder: Secondary | ICD-10-CM | POA: Diagnosis not present

## 2017-01-02 DIAGNOSIS — M25512 Pain in left shoulder: Secondary | ICD-10-CM | POA: Diagnosis not present

## 2017-01-03 DIAGNOSIS — M25512 Pain in left shoulder: Secondary | ICD-10-CM | POA: Diagnosis not present

## 2017-01-03 DIAGNOSIS — M7502 Adhesive capsulitis of left shoulder: Secondary | ICD-10-CM | POA: Diagnosis not present

## 2017-01-06 DIAGNOSIS — M7502 Adhesive capsulitis of left shoulder: Secondary | ICD-10-CM | POA: Diagnosis not present

## 2017-01-06 DIAGNOSIS — M25512 Pain in left shoulder: Secondary | ICD-10-CM | POA: Diagnosis not present

## 2017-01-14 DIAGNOSIS — M25512 Pain in left shoulder: Secondary | ICD-10-CM | POA: Diagnosis not present

## 2017-01-14 DIAGNOSIS — M7502 Adhesive capsulitis of left shoulder: Secondary | ICD-10-CM | POA: Diagnosis not present

## 2017-01-31 DIAGNOSIS — H2511 Age-related nuclear cataract, right eye: Secondary | ICD-10-CM | POA: Diagnosis not present

## 2017-01-31 DIAGNOSIS — E113513 Type 2 diabetes mellitus with proliferative diabetic retinopathy with macular edema, bilateral: Secondary | ICD-10-CM | POA: Diagnosis not present

## 2017-01-31 DIAGNOSIS — H43813 Vitreous degeneration, bilateral: Secondary | ICD-10-CM | POA: Diagnosis not present

## 2017-01-31 DIAGNOSIS — H3582 Retinal ischemia: Secondary | ICD-10-CM | POA: Diagnosis not present

## 2017-02-04 DIAGNOSIS — M25512 Pain in left shoulder: Secondary | ICD-10-CM | POA: Diagnosis not present

## 2017-02-04 DIAGNOSIS — M7502 Adhesive capsulitis of left shoulder: Secondary | ICD-10-CM | POA: Diagnosis not present

## 2017-02-10 DIAGNOSIS — M7502 Adhesive capsulitis of left shoulder: Secondary | ICD-10-CM | POA: Diagnosis not present

## 2017-02-10 DIAGNOSIS — M25512 Pain in left shoulder: Secondary | ICD-10-CM | POA: Diagnosis not present

## 2017-03-14 DIAGNOSIS — H43813 Vitreous degeneration, bilateral: Secondary | ICD-10-CM | POA: Diagnosis not present

## 2017-03-14 DIAGNOSIS — E113513 Type 2 diabetes mellitus with proliferative diabetic retinopathy with macular edema, bilateral: Secondary | ICD-10-CM | POA: Diagnosis not present

## 2017-03-14 DIAGNOSIS — H3582 Retinal ischemia: Secondary | ICD-10-CM | POA: Diagnosis not present

## 2017-03-17 DIAGNOSIS — M25512 Pain in left shoulder: Secondary | ICD-10-CM | POA: Diagnosis not present

## 2017-03-17 DIAGNOSIS — M25562 Pain in left knee: Secondary | ICD-10-CM | POA: Diagnosis not present

## 2017-03-17 DIAGNOSIS — M1712 Unilateral primary osteoarthritis, left knee: Secondary | ICD-10-CM | POA: Diagnosis not present

## 2017-03-31 DIAGNOSIS — Z961 Presence of intraocular lens: Secondary | ICD-10-CM | POA: Diagnosis not present

## 2017-03-31 DIAGNOSIS — H2511 Age-related nuclear cataract, right eye: Secondary | ICD-10-CM | POA: Diagnosis not present

## 2017-04-07 DIAGNOSIS — H2511 Age-related nuclear cataract, right eye: Secondary | ICD-10-CM | POA: Diagnosis not present

## 2017-04-10 DIAGNOSIS — H2511 Age-related nuclear cataract, right eye: Secondary | ICD-10-CM | POA: Diagnosis not present

## 2017-04-25 DIAGNOSIS — E113511 Type 2 diabetes mellitus with proliferative diabetic retinopathy with macular edema, right eye: Secondary | ICD-10-CM | POA: Diagnosis not present

## 2017-06-03 DIAGNOSIS — N181 Chronic kidney disease, stage 1: Secondary | ICD-10-CM | POA: Diagnosis not present

## 2017-06-03 DIAGNOSIS — I129 Hypertensive chronic kidney disease with stage 1 through stage 4 chronic kidney disease, or unspecified chronic kidney disease: Secondary | ICD-10-CM | POA: Diagnosis not present

## 2017-06-03 DIAGNOSIS — Z0000 Encounter for general adult medical examination without abnormal findings: Secondary | ICD-10-CM | POA: Diagnosis not present

## 2017-06-03 DIAGNOSIS — E11311 Type 2 diabetes mellitus with unspecified diabetic retinopathy with macular edema: Secondary | ICD-10-CM | POA: Diagnosis not present

## 2017-06-03 DIAGNOSIS — Z23 Encounter for immunization: Secondary | ICD-10-CM | POA: Diagnosis not present

## 2017-06-13 DIAGNOSIS — E113513 Type 2 diabetes mellitus with proliferative diabetic retinopathy with macular edema, bilateral: Secondary | ICD-10-CM | POA: Diagnosis not present

## 2017-06-13 DIAGNOSIS — H43813 Vitreous degeneration, bilateral: Secondary | ICD-10-CM | POA: Diagnosis not present

## 2017-06-13 DIAGNOSIS — H3582 Retinal ischemia: Secondary | ICD-10-CM | POA: Diagnosis not present

## 2017-07-02 ENCOUNTER — Ambulatory Visit (HOSPITAL_COMMUNITY)
Admission: RE | Admit: 2017-07-02 | Discharge: 2017-07-02 | Disposition: A | Discharge status: Home / Self Care | Payer: 59 | Source: Ambulatory Visit | Attending: Surgery | Admitting: Surgery

## 2017-07-02 ENCOUNTER — Other Ambulatory Visit (HOSPITAL_COMMUNITY): Payer: Self-pay | Admitting: Internal Medicine

## 2017-07-02 DIAGNOSIS — M79662 Pain in left lower leg: Secondary | ICD-10-CM

## 2017-07-02 DIAGNOSIS — R202 Paresthesia of skin: Secondary | ICD-10-CM | POA: Diagnosis not present

## 2017-07-02 DIAGNOSIS — M79669 Pain in unspecified lower leg: Secondary | ICD-10-CM | POA: Insufficient documentation

## 2017-07-02 DIAGNOSIS — M79661 Pain in right lower leg: Secondary | ICD-10-CM

## 2017-07-02 DIAGNOSIS — Z87891 Personal history of nicotine dependence: Secondary | ICD-10-CM | POA: Insufficient documentation

## 2017-07-18 DIAGNOSIS — E113513 Type 2 diabetes mellitus with proliferative diabetic retinopathy with macular edema, bilateral: Secondary | ICD-10-CM | POA: Diagnosis not present

## 2017-08-29 DIAGNOSIS — H43813 Vitreous degeneration, bilateral: Secondary | ICD-10-CM | POA: Diagnosis not present

## 2017-08-29 DIAGNOSIS — H3582 Retinal ischemia: Secondary | ICD-10-CM | POA: Diagnosis not present

## 2017-08-29 DIAGNOSIS — E113513 Type 2 diabetes mellitus with proliferative diabetic retinopathy with macular edema, bilateral: Secondary | ICD-10-CM | POA: Diagnosis not present

## 2017-09-03 DIAGNOSIS — E11311 Type 2 diabetes mellitus with unspecified diabetic retinopathy with macular edema: Secondary | ICD-10-CM | POA: Diagnosis not present

## 2017-09-03 DIAGNOSIS — N181 Chronic kidney disease, stage 1: Secondary | ICD-10-CM | POA: Diagnosis not present

## 2017-09-03 DIAGNOSIS — I129 Hypertensive chronic kidney disease with stage 1 through stage 4 chronic kidney disease, or unspecified chronic kidney disease: Secondary | ICD-10-CM | POA: Diagnosis not present

## 2017-09-16 ENCOUNTER — Telehealth: Payer: Self-pay | Admitting: *Deleted

## 2017-09-16 NOTE — Telephone Encounter (Signed)
Pt states she would like a call back to discuss her condition.

## 2017-09-16 NOTE — Telephone Encounter (Signed)
I told pt that I was calling to give her some instructions to perform prior to her coming in for an appt tomorrow. I told pt that if she every needed to call again to leave me more information so I would be able to triage and call sooner with her instructions. I told pt to keep the area clean, cover with an antibiotic ointment until seen in office tomorrow. Pt states understanding and I transferred to schedulers.

## 2017-09-16 NOTE — Telephone Encounter (Signed)
Pt states she was waiting on a call from our office for an appt.

## 2017-09-17 ENCOUNTER — Ambulatory Visit (INDEPENDENT_AMBULATORY_CARE_PROVIDER_SITE_OTHER): Payer: 59 | Admitting: Podiatry

## 2017-09-17 ENCOUNTER — Encounter: Payer: Self-pay | Admitting: Podiatry

## 2017-09-17 DIAGNOSIS — M2041 Other hammer toe(s) (acquired), right foot: Secondary | ICD-10-CM | POA: Diagnosis not present

## 2017-09-17 DIAGNOSIS — M2010 Hallux valgus (acquired), unspecified foot: Secondary | ICD-10-CM

## 2017-09-17 DIAGNOSIS — E119 Type 2 diabetes mellitus without complications: Secondary | ICD-10-CM | POA: Diagnosis not present

## 2017-09-17 DIAGNOSIS — M2042 Other hammer toe(s) (acquired), left foot: Secondary | ICD-10-CM | POA: Diagnosis not present

## 2017-09-17 NOTE — Progress Notes (Signed)
Complaint:  Visit Type: Patient returns to my office for her annual diabetic foot exam.  She has previously been diagnosed with hammer toes both feet.  She denies any foot pain or pathology besides the already diagnosed hammer toes.  Podiatric Exam: Vascular: dorsalis pedis and posterior tibial pulses are palpable bilateral. Capillary return is immediate. Temperature gradient is WNL. Skin turgor WNL  Sensorium: Normal Semmes Weinstein monofilament test. Normal tactile sensation bilaterally. Nail Exam: Normal nails noted with no evidence of bacterial or fungal infection. Ulcer Exam: There is no evidence of ulcer or pre-ulcerative changes or infection. Orthopedic Exam: Muscle tone and strength are WNL. No limitations in general ROM. No crepitus or effusions noted. Foot type and digits show no abnormalities. HAV  B/L with hammer toes 2-5.  Mallet toes 2-4  B/L Skin: No Porokeratosis. No infection or ulcers  Diagnosis: Diabetes with no complications.  Treatment & Plan Procedures and Treatment: Consent by patient was obtained for treatment procedures.   Diabetic foot exam reveals normal vascular, neurological and orthopedic finding except for Hammer toes  B/L. No ulceration, no infection noted. Padding dispensed for hammer toe third right foot. Return Visit-Office Procedure: Patient instructed to return to the office for a follow up visit 3 months for continued evaluation and treatment.    Helane GuntherGregory Deiondra Denley DPM

## 2017-10-10 DIAGNOSIS — E113511 Type 2 diabetes mellitus with proliferative diabetic retinopathy with macular edema, right eye: Secondary | ICD-10-CM | POA: Diagnosis not present

## 2017-10-10 DIAGNOSIS — H43813 Vitreous degeneration, bilateral: Secondary | ICD-10-CM | POA: Diagnosis not present

## 2017-10-10 DIAGNOSIS — E113592 Type 2 diabetes mellitus with proliferative diabetic retinopathy without macular edema, left eye: Secondary | ICD-10-CM | POA: Diagnosis not present

## 2017-10-10 DIAGNOSIS — H3582 Retinal ischemia: Secondary | ICD-10-CM | POA: Diagnosis not present

## 2017-11-17 ENCOUNTER — Ambulatory Visit: Payer: Commercial Managed Care - HMO | Admitting: Podiatry

## 2017-11-28 DIAGNOSIS — E113511 Type 2 diabetes mellitus with proliferative diabetic retinopathy with macular edema, right eye: Secondary | ICD-10-CM | POA: Diagnosis not present

## 2017-11-28 DIAGNOSIS — H43813 Vitreous degeneration, bilateral: Secondary | ICD-10-CM | POA: Diagnosis not present

## 2017-11-28 DIAGNOSIS — H3582 Retinal ischemia: Secondary | ICD-10-CM | POA: Diagnosis not present

## 2017-11-28 DIAGNOSIS — E113513 Type 2 diabetes mellitus with proliferative diabetic retinopathy with macular edema, bilateral: Secondary | ICD-10-CM | POA: Diagnosis not present

## 2017-12-04 DIAGNOSIS — I129 Hypertensive chronic kidney disease with stage 1 through stage 4 chronic kidney disease, or unspecified chronic kidney disease: Secondary | ICD-10-CM | POA: Diagnosis not present

## 2017-12-04 DIAGNOSIS — E11311 Type 2 diabetes mellitus with unspecified diabetic retinopathy with macular edema: Secondary | ICD-10-CM | POA: Diagnosis not present

## 2017-12-04 DIAGNOSIS — N181 Chronic kidney disease, stage 1: Secondary | ICD-10-CM | POA: Diagnosis not present

## 2017-12-04 DIAGNOSIS — Z1389 Encounter for screening for other disorder: Secondary | ICD-10-CM | POA: Diagnosis not present

## 2018-01-16 DIAGNOSIS — H43813 Vitreous degeneration, bilateral: Secondary | ICD-10-CM | POA: Diagnosis not present

## 2018-01-16 DIAGNOSIS — E113513 Type 2 diabetes mellitus with proliferative diabetic retinopathy with macular edema, bilateral: Secondary | ICD-10-CM | POA: Diagnosis not present

## 2018-01-16 DIAGNOSIS — H3582 Retinal ischemia: Secondary | ICD-10-CM | POA: Diagnosis not present

## 2018-01-28 DIAGNOSIS — E113513 Type 2 diabetes mellitus with proliferative diabetic retinopathy with macular edema, bilateral: Secondary | ICD-10-CM | POA: Diagnosis not present

## 2018-01-28 DIAGNOSIS — H401131 Primary open-angle glaucoma, bilateral, mild stage: Secondary | ICD-10-CM | POA: Diagnosis not present

## 2018-01-28 DIAGNOSIS — Z961 Presence of intraocular lens: Secondary | ICD-10-CM | POA: Diagnosis not present

## 2018-01-28 LAB — HM DIABETES EYE EXAM

## 2018-02-27 DIAGNOSIS — H3582 Retinal ischemia: Secondary | ICD-10-CM | POA: Diagnosis not present

## 2018-02-27 DIAGNOSIS — H43813 Vitreous degeneration, bilateral: Secondary | ICD-10-CM | POA: Diagnosis not present

## 2018-02-27 DIAGNOSIS — E113513 Type 2 diabetes mellitus with proliferative diabetic retinopathy with macular edema, bilateral: Secondary | ICD-10-CM | POA: Diagnosis not present

## 2018-03-04 DIAGNOSIS — I10 Essential (primary) hypertension: Secondary | ICD-10-CM | POA: Diagnosis not present

## 2018-03-04 DIAGNOSIS — E1165 Type 2 diabetes mellitus with hyperglycemia: Secondary | ICD-10-CM | POA: Diagnosis not present

## 2018-03-04 DIAGNOSIS — Z1211 Encounter for screening for malignant neoplasm of colon: Secondary | ICD-10-CM | POA: Diagnosis not present

## 2018-03-05 DIAGNOSIS — N08 Glomerular disorders in diseases classified elsewhere: Secondary | ICD-10-CM | POA: Diagnosis not present

## 2018-03-05 DIAGNOSIS — E1122 Type 2 diabetes mellitus with diabetic chronic kidney disease: Secondary | ICD-10-CM | POA: Diagnosis not present

## 2018-03-05 DIAGNOSIS — N181 Chronic kidney disease, stage 1: Secondary | ICD-10-CM | POA: Diagnosis not present

## 2018-04-10 DIAGNOSIS — H3582 Retinal ischemia: Secondary | ICD-10-CM | POA: Diagnosis not present

## 2018-04-10 DIAGNOSIS — H43813 Vitreous degeneration, bilateral: Secondary | ICD-10-CM | POA: Diagnosis not present

## 2018-04-10 DIAGNOSIS — E113513 Type 2 diabetes mellitus with proliferative diabetic retinopathy with macular edema, bilateral: Secondary | ICD-10-CM | POA: Diagnosis not present

## 2018-04-28 ENCOUNTER — Other Ambulatory Visit: Payer: Self-pay | Admitting: Internal Medicine

## 2018-05-22 DIAGNOSIS — E113513 Type 2 diabetes mellitus with proliferative diabetic retinopathy with macular edema, bilateral: Secondary | ICD-10-CM | POA: Diagnosis not present

## 2018-05-22 DIAGNOSIS — H3582 Retinal ischemia: Secondary | ICD-10-CM | POA: Diagnosis not present

## 2018-05-22 DIAGNOSIS — H43813 Vitreous degeneration, bilateral: Secondary | ICD-10-CM | POA: Diagnosis not present

## 2018-06-24 ENCOUNTER — Encounter: Payer: Self-pay | Admitting: Internal Medicine

## 2018-06-24 ENCOUNTER — Ambulatory Visit: Payer: 59 | Admitting: Internal Medicine

## 2018-06-24 VITALS — BP 132/76 | HR 104 | Temp 97.9°F | Ht 59.75 in | Wt 158.2 lb

## 2018-06-24 DIAGNOSIS — Z1239 Encounter for other screening for malignant neoplasm of breast: Secondary | ICD-10-CM | POA: Diagnosis not present

## 2018-06-24 DIAGNOSIS — N390 Urinary tract infection, site not specified: Secondary | ICD-10-CM | POA: Diagnosis not present

## 2018-06-24 DIAGNOSIS — Z0000 Encounter for general adult medical examination without abnormal findings: Secondary | ICD-10-CM | POA: Diagnosis not present

## 2018-06-24 DIAGNOSIS — E1122 Type 2 diabetes mellitus with diabetic chronic kidney disease: Secondary | ICD-10-CM | POA: Diagnosis not present

## 2018-06-24 DIAGNOSIS — N181 Chronic kidney disease, stage 1: Secondary | ICD-10-CM

## 2018-06-24 DIAGNOSIS — I129 Hypertensive chronic kidney disease with stage 1 through stage 4 chronic kidney disease, or unspecified chronic kidney disease: Secondary | ICD-10-CM | POA: Diagnosis not present

## 2018-06-24 LAB — POCT UA - MICROALBUMIN
Creatinine, POC: 300 mg/dL
Microalbumin Ur, POC: 80 mg/L

## 2018-06-24 LAB — POCT URINALYSIS DIPSTICK
Bilirubin, UA: NEGATIVE
Blood, UA: NEGATIVE
Glucose, UA: POSITIVE — AB
Ketones, UA: NEGATIVE
Leukocytes, UA: NEGATIVE
Nitrite, UA: POSITIVE
Protein, UA: NEGATIVE
Spec Grav, UA: 1.015 (ref 1.010–1.025)
Urobilinogen, UA: 0.2 E.U./dL
pH, UA: 5 (ref 5.0–8.0)

## 2018-06-24 MED ORDER — SEMAGLUTIDE (1 MG/DOSE) 2 MG/1.5ML ~~LOC~~ SOPN: 1.0000 mg | PEN_INJECTOR | SUBCUTANEOUS | 5 refills | Status: DC

## 2018-06-24 NOTE — Patient Instructions (Signed)

## 2018-06-25 LAB — CBC
Hematocrit: 39.4 % (ref 34.0–46.6)
Hemoglobin: 12.6 g/dL (ref 11.1–15.9)
MCH: 27.2 pg (ref 26.6–33.0)
MCHC: 32 g/dL (ref 31.5–35.7)
MCV: 85 fL (ref 79–97)
Platelets: 204 10*3/uL (ref 150–450)
RBC: 4.63 x10E6/uL (ref 3.77–5.28)
RDW: 13.5 % (ref 12.3–15.4)
WBC: 3.4 10*3/uL (ref 3.4–10.8)

## 2018-06-25 LAB — CMP14+EGFR
A/G RATIO: 1.4 (ref 1.2–2.2)
ALT: 14 IU/L (ref 0–32)
AST: 22 IU/L (ref 0–40)
Albumin: 4 g/dL (ref 3.6–4.8)
Alkaline Phosphatase: 59 IU/L (ref 39–117)
BUN/Creatinine Ratio: 14 (ref 12–28)
BUN: 10 mg/dL (ref 8–27)
Bilirubin Total: 0.4 mg/dL (ref 0.0–1.2)
CO2: 24 mmol/L (ref 20–29)
Calcium: 9.3 mg/dL (ref 8.7–10.3)
Chloride: 101 mmol/L (ref 96–106)
Creatinine, Ser: 0.73 mg/dL (ref 0.57–1.00)
GFR calc non Af Amer: 89 mL/min/{1.73_m2} (ref >59)
GFR, EST AFRICAN AMERICAN: 102 mL/min/{1.73_m2} (ref >59)
GLUCOSE: 165 mg/dL — AB (ref 65–99)
Globulin, Total: 2.8 g/dL (ref 1.5–4.5)
Potassium: 4 mmol/L (ref 3.5–5.2)
Sodium: 141 mmol/L (ref 134–144)
TOTAL PROTEIN: 6.8 g/dL (ref 6.0–8.5)

## 2018-06-25 LAB — LIPID PANEL
Chol/HDL Ratio: 2.9 ratio (ref 0.0–4.4)
Cholesterol, Total: 149 mg/dL (ref 100–199)
HDL: 52 mg/dL (ref >39)
LDL Calculated: 81 mg/dL (ref 0–99)
Triglycerides: 81 mg/dL (ref 0–149)
VLDL Cholesterol Cal: 16 mg/dL (ref 5–40)

## 2018-06-25 LAB — HEMOGLOBIN A1C
Est. average glucose Bld gHb Est-mCnc: 143 mg/dL
Hgb A1c MFr Bld: 6.6 % — ABNORMAL HIGH (ref 4.8–5.6)

## 2018-06-25 LAB — HEPATITIS C ANTIBODY

## 2018-06-25 NOTE — Progress Notes (Signed)
You are neg for hepatitis c virus. Your liver and kidney fxn are nl. Your blood count is nl. Your chol is pretty good. Goal ldl is less than 70, you are at 81. Continue with current meds. Your hba1c is 6.6, this is awesome! Once you resume exercise, we will be able to further titrate your insulin.

## 2018-06-26 ENCOUNTER — Telehealth: Payer: Self-pay

## 2018-06-26 NOTE — Telephone Encounter (Signed)
-----   Message from Dorothyann Pengobyn Sanders, MD sent at 06/25/2018  6:00 PM EST ----- You are neg for hepatitis c virus. Your liver and kidney fxn are nl. Your blood count is nl. Your chol is pretty good. Goal ldl is less than 70, you are at 81. Continue with current meds. Your hba1c is 6.6, this is awesome! Once you resume exercise, we will be able to further titrate your insulin.

## 2018-06-26 NOTE — Telephone Encounter (Signed)
The pt was notified of her recent lab results.

## 2018-07-10 DIAGNOSIS — E113513 Type 2 diabetes mellitus with proliferative diabetic retinopathy with macular edema, bilateral: Secondary | ICD-10-CM | POA: Diagnosis not present

## 2018-07-10 DIAGNOSIS — H43813 Vitreous degeneration, bilateral: Secondary | ICD-10-CM | POA: Diagnosis not present

## 2018-07-10 DIAGNOSIS — H3582 Retinal ischemia: Secondary | ICD-10-CM | POA: Diagnosis not present

## 2018-07-18 ENCOUNTER — Other Ambulatory Visit: Payer: Self-pay | Admitting: Internal Medicine

## 2018-07-20 NOTE — Telephone Encounter (Signed)
Gabapentin refill

## 2018-07-21 ENCOUNTER — Other Ambulatory Visit: Payer: Self-pay | Admitting: Internal Medicine

## 2018-07-21 DIAGNOSIS — R9431 Abnormal electrocardiogram [ECG] [EKG]: Secondary | ICD-10-CM

## 2018-07-21 DIAGNOSIS — S300XXA Contusion of lower back and pelvis, initial encounter: Secondary | ICD-10-CM | POA: Diagnosis not present

## 2018-07-21 NOTE — Progress Notes (Signed)
ref

## 2018-07-22 ENCOUNTER — Encounter: Payer: Self-pay | Admitting: Internal Medicine

## 2018-07-22 ENCOUNTER — Ambulatory Visit: Payer: 59 | Admitting: Internal Medicine

## 2018-07-22 NOTE — Progress Notes (Signed)
Subjective:     Patient ID: Deberah Castle , female    DOB: Jan 12, 1956 , 63 y.o.   MRN: [id number]   Chief Complaint  Patient presents with  . Annual Exam  . Diabetes  . Hypertension    HPI  She is here today for a full physical examination. She is followed by Gyn for her pelvic exams. She has no specific concerns or complaints at this time. x  Diabetes  She presents for her follow-up diabetic visit. She has type 2 diabetes mellitus. Her disease course has been improving. There are no hypoglycemic associated symptoms. Pertinent negatives for diabetes include no blurred vision, no chest pain and no foot paresthesias. There are no hypoglycemic complications. Risk factors for coronary artery disease include diabetes mellitus, hypertension, dyslipidemia, post-menopausal, sedentary lifestyle and obesity. She is compliant with treatment most of the time.  Hypertension  This is a chronic problem. The current episode started more than 1 year ago. The problem has been gradually improving since onset. The problem is controlled. Pertinent negatives include no blurred vision, chest pain, palpitations or shortness of breath.  She reports compliance with meds.    Past Medical History:  Diagnosis Date  . Diabetes mellitus   . Hypertension      Family History  Problem Relation Age of Onset  . Diabetes Mother   . Hypertension Mother      Current Outpatient Medications:  .  insulin detemir (LEVEMIR) 100 UNIT/ML injection, Inject 35 Units into the skin at bedtime. , Disp: , Rfl:  .  lisinopril (PRINIVIL,ZESTRIL) 10 MG tablet, Take 10 mg by mouth daily., Disp: , Rfl: 2 .  SYNJARDY XR 04-999 MG TB24, TAKE 1 TABLET BY ORAL ROUTE EVERY DAY IN THE MORNING WITH A MEAL, Disp: 30 tablet, Rfl: 5 .  timolol (BETIMOL) 0.5 % ophthalmic solution, 1 drop 2 (two) times daily., Disp: , Rfl:  .  gabapentin (NEURONTIN) 300 MG capsule, TAKE 1 CAPSULE BY MOUTH EVERY DAY, Disp: 90 capsule, Rfl: 2 .   Semaglutide, 1 MG/DOSE, (OZEMPIC, 1 MG/DOSE,) 2 MG/1.5ML SOPN, Inject 1 mg into the skin once a week., Disp: 1 pen, Rfl: 5   Allergies  Allergen Reactions  . Penicillins       No LMP recorded. Patient is postmenopausal... Negative for: breast discharge, breast lump(s), breast pain and breast self exam. Associated symptoms include abnormal vaginal bleeding. Pertinent negatives include abnormal bleeding (hematology), anxiety, decreased libido, depression, difficulty falling sleep, dyspareunia, history of infertility, nocturia, sexual dysfunction, sleep disturbances, urinary incontinence, urinary urgency, vaginal discharge and vaginal itching. Diet regular.The patient states her exercise level is  minimal.  . The patient's tobacco use is:  Social History   Tobacco Use  Smoking Status Never Smoker  Smokeless Tobacco Never Used  . She has been exposed to passive smoke. The patient's alcohol use is:  Social History   Substance and Sexual Activity  Alcohol Use No    Review of Systems  Constitutional: Negative.   HENT: Negative.   Eyes: Negative.  Negative for blurred vision.  Respiratory: Negative.  Negative for shortness of breath.   Cardiovascular: Negative.  Negative for chest pain and palpitations.  Gastrointestinal: Negative.   Endocrine: Negative.   Genitourinary: Negative.   Musculoskeletal: Negative.   Skin: Negative.   Neurological: Negative.   Hematological: Negative.   Psychiatric/Behavioral: Negative.      Today's Vitals   06/24/18 1421  BP: 132/76  Pulse: (!) 104  Temp: 97.9 F (36.6 C)  TempSrc: Oral  Weight: 158 lb 3.2 oz (71.8 kg)  Height: 4' 11.75" (1.518 m)   Body mass index is 31.16 kg/m.   Objective:  Physical Exam Vitals signs and nursing note reviewed.  Constitutional:      Appearance: Normal appearance. She is obese.  HENT:     Head: Normocephalic and atraumatic.     Right Ear: Tympanic membrane, ear canal and external ear normal.     Left  Ear: Tympanic membrane, ear canal and external ear normal.     Nose: Nose normal.     Mouth/Throat:     Mouth: Mucous membranes are moist.     Pharynx: Oropharynx is clear. No oropharyngeal exudate.  Eyes:     Extraocular Movements: Extraocular movements intact.     Conjunctiva/sclera: Conjunctivae normal.     Pupils: Pupils are equal, round, and reactive to light.  Neck:     Musculoskeletal: Normal range of motion and neck supple.  Cardiovascular:     Rate and Rhythm: Normal rate and regular rhythm.     Pulses: Normal pulses.          Dorsalis pedis pulses are 2+ on the right side and 2+ on the left side.     Heart sounds: Normal heart sounds.  Pulmonary:     Effort: Pulmonary effort is normal.     Breath sounds: Normal breath sounds.  Chest:     Breasts:        Right: Normal. No swelling, bleeding, inverted nipple, mass or nipple discharge.        Left: Normal. No swelling, bleeding, inverted nipple, mass or nipple discharge.  Abdominal:     General: Abdomen is flat. Bowel sounds are normal.     Palpations: Abdomen is soft.  Genitourinary:    Comments: deferred Musculoskeletal: Normal range of motion.     Right foot: Normal range of motion. No deformity.     Left foot: Normal range of motion. No deformity.  Feet:     Right foot:     Protective Sensation: 5 sites tested. 5 sites sensed.     Skin integrity: Skin integrity normal.     Left foot:     Protective Sensation: 5 sites tested. 5 sites sensed.     Skin integrity: Skin integrity normal.  Skin:    General: Skin is warm and dry.  Neurological:     General: No focal deficit present.     Mental Status: She is alert and oriented to person, place, and time.  Psychiatric:        Mood and Affect: Mood normal.        Behavior: Behavior normal.         Assessment And Plan:     1. Routine general medical examination at health care facility  A full exam was performed.  Importance of monthly self breast exams was  discussed with the patient.  PATIENT HAS BEEN ADVISED TO GET 30-45 MINUTES REGULAR EXERCISE NO LESS THAN FOUR TO FIVE DAYS PER WEEK - BOTH WEIGHTBEARING EXERCISES AND AEROBIC ARE RECOMMENDED.  SHE IS ADVISED TO FOLLOW A HEALTHY DIET WITH AT LEAST SIX FRUITS/VEGGIES PER DAY, DECREASE INTAKE OF RED MEAT, AND TO INCREASE FISH INTAKE TO TWO DAYS PER WEEK.  MEATS/FISH SHOULD NOT BE FRIED, BAKED OR BROILED IS PREFERABLE.  I SUGGEST WEARING SPF 50 SUNSCREEN ON EXPOSED PARTS AND ESPECIALLY WHEN IN THE DIRECT SUNLIGHT FOR AN EXTENDED PERIOD OF TIME.  PLEASE AVOID FAST FOOD RESTAURANTS  AND INCREASE YOUR WATER INTAKE.  - Hepatitis C antibody - CMP14+EGFR - CBC - Lipid panel - Hemoglobin A1c  2. Diabetes mellitus with stage 1 chronic kidney disease ([place])  Diabetic foot exam was performed.  I DISCUSSED WITH THE PATIENT AT LENGTH REGARDING THE GOALS OF GLYCEMIC CONTROL AND POSSIBLE LONG-TERM COMPLICATIONS.  I  ALSO STRESSED THE IMPORTANCE OF COMPLIANCE WITH HOME GLUCOSE MONITORING, DIETARY RESTRICTIONS INCLUDING AVOIDANCE OF SUGARY DRINKS/PROCESSED FOODS,  ALONG WITH REGULAR EXERCISE.  I  ALSO STRESSED THE IMPORTANCE OF ANNUAL EYE EXAMS, SELF FOOT CARE AND COMPLIANCE WITH OFFICE VISITS.  - POCT Urinalysis Dipstick (81002) - POCT UA - Microalbumin  3. Hypertensive nephropathy  Fair control. She will continue with current meds. She is encouraged to avoid adding salt to her foods. Importance of regular exercise was discussed with the patient.   - EKG 12-Lead  4. Breast cancer screening  [facility]   - MM Digital Screening; Future  5. Urinary tract infection without hematuria, site unspecified  I will check urine culture. She is encouraged to stay well hydrated.   - POCT Urinalysis Dipstick (49494) - Culture, Urine       [name], MD

## 2018-07-24 ENCOUNTER — Encounter: Payer: Self-pay | Admitting: Internal Medicine

## 2018-07-24 ENCOUNTER — Ambulatory Visit: Payer: 59 | Admitting: Internal Medicine

## 2018-07-24 VITALS — BP 126/74 | HR 95 | Temp 98.1°F | Ht 61.5 in | Wt 158.0 lb

## 2018-07-24 DIAGNOSIS — M533 Sacrococcygeal disorders, not elsewhere classified: Secondary | ICD-10-CM

## 2018-07-24 DIAGNOSIS — Z09 Encounter for follow-up examination after completed treatment for conditions other than malignant neoplasm: Secondary | ICD-10-CM

## 2018-07-24 DIAGNOSIS — W108XXS Fall (on) (from) other stairs and steps, sequela: Secondary | ICD-10-CM | POA: Diagnosis not present

## 2018-07-24 DIAGNOSIS — Y92009 Unspecified place in unspecified non-institutional (private) residence as the place of occurrence of the external cause: Secondary | ICD-10-CM | POA: Diagnosis not present

## 2018-07-26 ENCOUNTER — Encounter: Payer: Self-pay | Admitting: Internal Medicine

## 2018-07-27 ENCOUNTER — Encounter: Payer: Self-pay | Admitting: Internal Medicine

## 2018-07-27 ENCOUNTER — Other Ambulatory Visit: Payer: Self-pay | Admitting: Internal Medicine

## 2018-07-27 MED ORDER — HYDROCODONE-ACETAMINOPHEN 5-325 MG PO TABS: 1.0000 | ORAL_TABLET | Freq: Four times a day (QID) | ORAL | 0 refills | Status: DC | PRN

## 2018-07-27 NOTE — Progress Notes (Signed)
Subjective:     Patient ID: Sharon Mcdonald , female    DOB: February 18, 1956 , 63 y.o.   MRN: [id number]   Chief Complaint  Patient presents with  . Urgent care f/u    HPI   She is here today for f/u Urgent Care visit. She reports she fell down her steps on 1/12. She states her foot got caught on something as she walked down the steps. She states she fell on her tailbone and had difficulty walking. She went to Ortho Urgent care on 1/14 - she was advised no bones were broken and given rx vicodin prn. Unfortunately, she is still having a lot of pain.     Past Medical History:  Diagnosis Date  . Diabetes mellitus   . Hypertension      Family History  Problem Relation Age of Onset  . Diabetes Mother   . Hypertension Mother      Current Outpatient Medications:  .  gabapentin (NEURONTIN) 300 MG capsule, TAKE 1 CAPSULE BY MOUTH EVERY DAY, Disp: 90 capsule, Rfl: 2 .  HYDROcodone-acetaminophen (NORCO/VICODIN) 5-325 MG tablet, Take 1 tablet by mouth every 6 (six) hours as needed for moderate pain., Disp: 30 tablet, Rfl: 0 .  insulin detemir (LEVEMIR) 100 UNIT/ML injection, Inject 35 Units into the skin at bedtime. , Disp: , Rfl:  .  lisinopril (PRINIVIL,ZESTRIL) 10 MG tablet, Take 10 mg by mouth daily., Disp: , Rfl: 2 .  Semaglutide, 1 MG/DOSE, (OZEMPIC, 1 MG/DOSE,) 2 MG/1.5ML SOPN, Inject 1 mg into the skin once a week., Disp: 1 pen, Rfl: 5 .  SYNJARDY XR 04-999 MG TB24, TAKE 1 TABLET BY ORAL ROUTE EVERY DAY IN THE MORNING WITH A MEAL, Disp: 30 tablet, Rfl: 5 .  timolol (BETIMOL) 0.5 % ophthalmic solution, 1 drop 2 (two) times daily., Disp: , Rfl:    Allergies  Allergen Reactions  . Penicillins      Review of Systems  Constitutional: Negative.   Respiratory: Negative.   Cardiovascular: Negative.   Gastrointestinal: Negative.   Neurological: Negative.   Psychiatric/Behavioral: Negative.      Today's Vitals   07/24/18 1453  BP: 126/74  Pulse: 95  Temp: 98.1 F (36.7 C)   Weight: 158 lb (71.7 kg)  Height: 5' 1.5" (1.562 m)  PainSc: 7   PainLoc: Back   Body mass index is 29.37 kg/m.   Objective:  Physical Exam Constitutional:      Appearance: Normal appearance.  HENT:     Head: Normocephalic and atraumatic.  Cardiovascular:     Rate and Rhythm: Normal rate and regular rhythm.     Heart sounds: Normal heart sounds.  Pulmonary:     Effort: Pulmonary effort is normal.     Breath sounds: Normal breath sounds.  Musculoskeletal:        General: Tenderness present.     Comments: She has sacral tenderness, no overlying erythema  Skin:    General: Skin is warm.  Neurological:     Mental Status: She is alert.  Psychiatric:        Mood and Affect: Mood normal.         Assessment And Plan:     1. Sacral pain  Acute, s/p fall.Marland Kitchen PDMP report run, she should run out of pain meds on Sunday. Pt advised I will send in a new rx vicodin on Sunday or Monday. She is encouraged to also apply topical pain patch to this area. She is also encouraged to move  around as much as possible. She agrees to f/u with Ortho should she not have any improvement in her sx within the next week.   2. Fall (on) (from) other stairs and steps, sequela  This occurred 07/19/2018. I will request her records from [facility].   Gwynneth Alimentobyn N Laylia Mui, MD

## 2018-08-01 ENCOUNTER — Other Ambulatory Visit: Payer: Self-pay | Admitting: Internal Medicine

## 2018-08-20 ENCOUNTER — Other Ambulatory Visit: Payer: Self-pay | Admitting: Internal Medicine

## 2018-09-02 ENCOUNTER — Encounter: Payer: Self-pay | Admitting: Internal Medicine

## 2018-09-03 ENCOUNTER — Encounter: Payer: Self-pay | Admitting: Internal Medicine

## 2018-09-07 ENCOUNTER — Encounter: Payer: Self-pay | Admitting: Cardiovascular Disease

## 2018-09-07 ENCOUNTER — Ambulatory Visit: Payer: 59 | Admitting: Cardiovascular Disease

## 2018-09-07 ENCOUNTER — Encounter

## 2018-09-07 VITALS — BP 144/88 | HR 84 | Ht 61.5 in | Wt 155.6 lb

## 2018-09-07 DIAGNOSIS — I10 Essential (primary) hypertension: Secondary | ICD-10-CM

## 2018-09-07 DIAGNOSIS — R9431 Abnormal electrocardiogram [ECG] [EKG]: Secondary | ICD-10-CM | POA: Diagnosis not present

## 2018-09-07 DIAGNOSIS — E7800 Pure hypercholesterolemia, unspecified: Secondary | ICD-10-CM

## 2018-09-07 NOTE — Patient Instructions (Signed)
Medication Instructions:  Your physician recommends that you continue on your current medications as directed. Please refer to the Current Medication list given to you today.  If you need a refill on your cardiac medications before your next appointment, please call your pharmacy.   Lab work: NONE   Testing/Procedures: NONE  Follow-Up: At [facility], you and your health needs are our priority.  As part of our continuing mission to provide you with exceptional heart care, we have created designated Provider Care Teams.  These Care Teams include your primary Cardiologist (physician) and Advanced Practice Providers (APPs -  Physician Assistants and Nurse Practitioners) who all work together to provide you with the care you need, when you need it. You will need a follow up appointment in 12 months.  Please call our office 2 months in advance to schedule this appointment.  You may see DR [facility]  or one of the following Advanced Practice Providers on your designated Care Team:   Corine Shelter, PA-C Judy Pimple, [place] . Marjie Skiff, PA-C  Any Other Special Instructions Will Be Listed Below (If Applicable). MONITOR AND LOG YOUR BLOOD PRESSURE AT HOME. TAKE READINGS TO YOUR FOLLOW UP WITH DR Allyne Gee NEXT MONTH

## 2018-09-07 NOTE — Progress Notes (Signed)
Cardiology Office Note   Date:  09/07/2018   ID:  Sharon Mcdonald, DOB [birth year], MRN [id number]  PCP:  Sharon Peng, MD  Cardiologist:   Sharon Si, MD   No chief complaint on file.     History of Present Illness: Sharon Mcdonald is a 63 y.o. female with diabetes and hypertension who is being seen today for the evaluation of abnormal EKG at the request of Sharon Peng, MD.  [name] saw Dr. Allyne Mcdonald 06/2018 for her annual exam. She had an EKG that was concerning prior anterior MI.  She was feeling well at the time.  However, given her comorbidities, she was was referred to cardiology for evaluation.  [name] goes to the gym and does weight training 3 days per week.  She tries to walk on the other days.  She has no exertional chest pain or shortness of breath.  [name] denies lower extremity edema, orthopnea, or PND.  She reports being diagnosed with diabetes in 2003.  At first she was in denial about the diagnosis and didn't receive treatment for years.  She didn't accept it until she started seeing signs of end organ damage.  She reports that her BP has been labile but mostly >130/80.      Past Medical History:  Diagnosis Date  . Diabetes mellitus   . Hypertension     Past Surgical History:  Procedure Laterality Date  . BREAST SURGERY    . TONSILLECTOMY    . TUBAL LIGATION       Current Outpatient Medications  Medication Sig Dispense Refill  . gabapentin (NEURONTIN) 300 MG capsule TAKE 1 CAPSULE BY MOUTH EVERY DAY 90 capsule 2  . HYDROcodone-acetaminophen (NORCO/VICODIN) 5-325 MG tablet Take 1 tablet by mouth every 6 (six) hours as needed for moderate pain. 30 tablet 0  . insulin detemir (LEVEMIR) 100 UNIT/ML injection Inject 35 Units into the skin at bedtime.     Marland Kitchen lisinopril (PRINIVIL,ZESTRIL) 10 MG tablet TAKE ONE TABLET BY MOUTH DAILY 90 tablet 2  . pravastatin (PRAVACHOL) 20 MG tablet TAKE 1 TABLET BY MOUTH EVERY DAY 90 tablet 1  .  Semaglutide, 1 MG/DOSE, (OZEMPIC, 1 MG/DOSE,) 2 MG/1.5ML SOPN Inject 1 mg into the skin once a week. 1 pen 5  . SYNJARDY XR 04-999 MG TB24 TAKE 1 TABLET BY ORAL ROUTE EVERY DAY IN THE MORNING WITH A MEAL 30 tablet 5  . timolol (BETIMOL) 0.5 % ophthalmic solution 1 drop 2 (two) times daily.     No current facility-administered medications for this visit.     Allergies:   Penicillins    Social History:  The patient  reports that she quit smoking about 40 years ago. Her smoking use included cigarettes. She has never used smokeless tobacco. She reports that she does not drink alcohol or use drugs.   Family History:  The patient's family history includes Diabetes in her brother and mother; Hypertension in her mother; Obesity in her mother.    ROS:  Please see the history of present illness.   Otherwise, review of systems are positive for none.   All other systems are reviewed and negative.    PHYSICAL EXAM: VS:  BP (!) 144/88 Comment: right arm  Pulse 84   Ht 5' 1.5" (1.562 m)   Wt 155 lb 9.6 oz (70.6 kg)   BMI 28.92 kg/m  , BMI Body mass index is 28.92 kg/m. GENERAL:  Well appearing HEENT:  Pupils equal round  and reactive, fundi not visualized, oral mucosa unremarkable NECK:  No jugular venous distention, waveform within normal limits, carotid upstroke brisk and symmetric, no bruits, no thyromegaly LYMPHATICS:  No cervical adenopathy LUNGS:  Clear to auscultation bilaterally HEART:  RRR.  PMI not displaced or sustained,S1 and S2 within normal limits, no S3, no S4, no clicks, no rubs, no murmurs ABD:  Flat, positive bowel sounds normal in frequency in pitch, no bruits, no rebound, no guarding, no midline pulsatile mass, no hepatomegaly, no splenomegaly EXT:  2 plus pulses throughout, no edema, no cyanosis no clubbing SKIN:  No rashes no nodules NEURO:  Cranial nerves II through XII grossly intact, motor grossly intact throughout PSYCH:  Cognitively intact, oriented to person place and  time    EKG:  EKG is ordered today. The ekg ordered 06/25/18 demonstrates sinus rhythm.  Rate 97 bpm.  R atrial enlargement.    Recent Labs: 06/24/2018: ALT 14; BUN 10; Creatinine, Ser 0.73; Hemoglobin 12.6; Platelets 204; Potassium 4.0; Sodium 141    Lipid Panel    Component Value Date/Time   CHOL 149 06/24/2018 1515   TRIG 81 06/24/2018 1515   HDL 52 06/24/2018 1515   CHOLHDL 2.9 06/24/2018 1515   LDLCALC 81 06/24/2018 1515      Wt Readings from Last 3 Encounters:  09/07/18 155 lb 9.6 oz (70.6 kg)  07/24/18 158 lb (71.7 kg)  06/24/18 158 lb 3.2 oz (71.8 kg)      ASSESSMENT AND PLAN:  # Abnormal EKG:  EKG is not consistent with prior infarct.  She has no symptoms and exercises regularly.  Therefore no plans for an ischemia evaluation at this time.    # Hypertension:  BP is elevated both initially and on repeat.  Continue lisinopril.  She thinks that it has been better at home.  She will track her BP and bring both her log and monitor to follow up with Dr. Allyne Mcdonald next month.  Goal is <130/80.  Recommend increasing lisinopril if BP remains above goal.  # Hyperlipidemia:  Continue pravastatin.  LDL 81 on 06/2018.   Current medicines are reviewed at length with the patient today.  The patient does not have concerns regarding medicines.  The following changes have been made:  no change  Labs/ tests ordered today include:  No orders of the defined types were placed in this encounter.    Disposition:   FU with Sharon Mcdonald C. Sharon Salvia, MD, [facility] in 1 year.     Signed, Sharon Mcdonald C. Sharon Salvia, MD, [facility]  09/07/2018 6:59 PM    [facility]

## 2018-09-23 ENCOUNTER — Other Ambulatory Visit: Payer: Self-pay

## 2018-09-23 ENCOUNTER — Encounter: Payer: Self-pay | Admitting: Internal Medicine

## 2018-09-23 MED ORDER — LEVEMIR FLEXTOUCH 100 UNIT/ML ~~LOC~~ SOPN: PEN_INJECTOR | SUBCUTANEOUS | 1 refills | Status: DC

## 2018-10-13 ENCOUNTER — Encounter: Payer: Self-pay | Admitting: Internal Medicine

## 2018-10-19 ENCOUNTER — Other Ambulatory Visit: Payer: Self-pay | Admitting: Internal Medicine

## 2018-10-21 ENCOUNTER — Telehealth: Payer: Self-pay | Admitting: Internal Medicine

## 2018-10-21 NOTE — Telephone Encounter (Signed)
Patient has agreed to do a virtual visit with dr. Allyne Gee on 10/27/18

## 2018-10-27 ENCOUNTER — Encounter: Payer: Self-pay | Admitting: Internal Medicine

## 2018-10-27 ENCOUNTER — Other Ambulatory Visit: Payer: Self-pay

## 2018-10-27 ENCOUNTER — Ambulatory Visit (INDEPENDENT_AMBULATORY_CARE_PROVIDER_SITE_OTHER): Payer: 59 | Admitting: Internal Medicine

## 2018-10-27 VITALS — Ht 61.5 in | Wt 158.0 lb

## 2018-10-27 DIAGNOSIS — N181 Chronic kidney disease, stage 1: Secondary | ICD-10-CM

## 2018-10-27 DIAGNOSIS — I129 Hypertensive chronic kidney disease with stage 1 through stage 4 chronic kidney disease, or unspecified chronic kidney disease: Secondary | ICD-10-CM | POA: Diagnosis not present

## 2018-10-27 DIAGNOSIS — Z6829 Body mass index (BMI) 29.0-29.9, adult: Secondary | ICD-10-CM | POA: Diagnosis not present

## 2018-10-27 DIAGNOSIS — E1122 Type 2 diabetes mellitus with diabetic chronic kidney disease: Secondary | ICD-10-CM

## 2018-10-27 NOTE — Patient Instructions (Signed)

## 2018-10-27 NOTE — Progress Notes (Signed)
Virtual Visit via Video Note   This visit type was conducted due to national recommendations for restrictions regarding the COVID-19 Pandemic (e.g. social distancing) in an effort to limit this patient's exposure and mitigate transmission in our community.  Due to her co-morbid illnesses, this patient is at least at moderate risk for complications without adequate follow up.  This format is felt to be most appropriate for this patient at this time.  All issues noted in this document were discussed and addressed.  A limited physical exam was performed with this format.  Please refer to the patient's chart for her consent to telehealth for [facility].     This visit type was conducted due to national recommendations for restrictions regarding the COVID-19 Pandemic (e.g. social distancing) in an effort to limit this patient's exposure and mitigate transmission in our community.  This format is felt to be most appropriate for this patient at this time.  All issues noted in this document were discussed and addressed.  No physical exam was performed (except for noted visual exam findings with Video Visits).  Please refer to the patient's chart (MyChart message for video visits and phone note for telephone visits) for the patient's consent to telehealth for [facility]  Date:  10/27/2018   ID:  Sharon Mcdonald, DOB 1955/12/27, MRN [id number]  Patient Location:  Home  Provider location:   Office    Chief Complaint:  Diabetes f/u  History of Present Illness:    Sharon Mcdonald is a 63 y.o. female who presents via video conferencing for a telehealth visit today.    The patient does not have symptoms concerning for COVID-19 infection (fever, chills, cough, or new shortness of breath).   Diabetes  She presents for her follow-up diabetic visit. She has type 2 diabetes mellitus. Her disease course has been stable. There are no hypoglycemic associated symptoms. Pertinent negatives for diabetes  include no blurred vision and no chest pain. There are no hypoglycemic complications. Diabetic complications include nephropathy. Risk factors for coronary artery disease include diabetes mellitus, dyslipidemia, hypertension and post-menopausal. She is following a diabetic diet. She participates in exercise three times a week. Her breakfast blood glucose is taken between 7-8 am. Her breakfast blood glucose range is generally 90-110 mg/dl. An ACE inhibitor/angiotensin II receptor blocker is being taken. Eye exam is current.  Hypertension  This is a chronic problem. The current episode started more than 1 year ago. The problem is unchanged. The problem is controlled. Pertinent negatives include no blurred vision, chest pain, palpitations or shortness of breath.     Past Medical History:  Diagnosis Date  . Diabetes mellitus   . High cholesterol   . Hypertension    Past Surgical History:  Procedure Laterality Date  . BREAST SURGERY    . TONSILLECTOMY    . TUBAL LIGATION       Current Meds  Medication Sig  . gabapentin (NEURONTIN) 300 MG capsule TAKE 1 CAPSULE BY MOUTH EVERY DAY  . HYDROcodone-acetaminophen (NORCO/VICODIN) 5-325 MG tablet Take 1 tablet by mouth every 6 (six) hours as needed for moderate pain.  Marland Kitchen LEVEMIR FLEXTOUCH 100 UNIT/ML Pen Inject 40 units by subcutaneous route once in the evening (Patient taking differently: 30 Units. Inject 40 units by subcutaneous route once in the evening)  . lisinopril (PRINIVIL,ZESTRIL) 10 MG tablet TAKE ONE TABLET BY MOUTH DAILY  . OZEMPIC, 1 MG/DOSE, 2 MG/1.5ML SOPN INJECT (1MG) BY SUBCUTANEOUS ROUTE EVERY WEEK ON THE SAME DAY  OF EACH WEEK, IN THE ABDOMEN, THIGHS, OR UPPER ARM ROTATING INJECTION SITES  . pravastatin (PRAVACHOL) 20 MG tablet TAKE 1 TABLET BY MOUTH EVERY DAY  . SYNJARDY XR 04-999 MG TB24 TAKE 1 TABLET BY ORAL ROUTE EVERY DAY IN THE MORNING WITH A MEAL  . timolol (BETIMOL) 0.5 % ophthalmic solution 1 drop 2 (two) times daily.      Allergies:   Penicillins   Social History   Tobacco Use  . Smoking status: Former Smoker    Types: Cigarettes    Last attempt to quit: 09/07/1978    Years since quitting: 40.1  . Smokeless tobacco: Never Used  . Tobacco comment: 1/2 pack per week  Substance Use Topics  . Alcohol use: No  . Drug use: No     Family Hx: The patient's family history includes Diabetes in her brother and mother; Hypertension in her mother; Obesity in her mother.  ROS:   Please see the history of present illness.    Review of Systems  Constitutional: Negative.   Eyes: Negative for blurred vision.  Respiratory: Negative.  Negative for shortness of breath.   Cardiovascular: Negative.  Negative for chest pain and palpitations.  Gastrointestinal: Negative.   Neurological: Negative.   Psychiatric/Behavioral: Negative.     All other systems reviewed and are negative.   Labs/Other Tests and Data Reviewed:    Recent Labs: 06/24/2018: ALT 14; BUN 10; Creatinine, Ser 0.73; Hemoglobin 12.6; Platelets 204; Potassium 4.0; Sodium 141   Recent Lipid Panel Lab Results  Component Value Date/Time   CHOL 149 06/24/2018 03:15 PM   TRIG 81 06/24/2018 03:15 PM   HDL 52 06/24/2018 03:15 PM   CHOLHDL 2.9 06/24/2018 03:15 PM   LDLCALC 81 06/24/2018 03:15 PM    Wt Readings from Last 3 Encounters:  10/27/18 158 lb (71.7 kg)  09/07/18 155 lb 9.6 oz (70.6 kg)  07/24/18 158 lb (71.7 kg)     Exam:    Vital Signs:  Ht 5' 1.5" (1.562 m)   Wt 158 lb (71.7 kg)   BMI 29.37 kg/m     Physical Exam  Constitutional: She is oriented to person, place, and time and well-developed, well-nourished, and in no distress.  HENT:  Head: Normocephalic and atraumatic.  Neck: Normal range of motion.  Pulmonary/Chest: Effort normal.  Neurological: She is alert and oriented to person, place, and time.  Psychiatric: Affect normal.  Nursing note and vitals reviewed.   ASSESSMENT & PLAN:     1. Diabetes mellitus with stage  1 chronic kidney disease ([place])  She agrees to come in next week for bloodwork. She agrees to rto in four months for her next evaluation. Importance of regular exercise was discussed with the patient.   - BMP8+EGFR; Future - Hemoglobin A1c; Future  2. Hypertensive nephropathy  She will continue with current meds.   3. Adult BMI 29.0-29.9 kg/sq m  She is encouraged to strive for BMI less than 27 to decrease her cardiac risk. She was congratulated on her lifestyle changes and the strides she has made over the years. She is encouraged to keep up the great work!   COVID-19 Education: The signs and symptoms of COVID-19 were discussed with the patient and how to seek care for testing (follow up with PCP or arrange E-visit).  The importance of social distancing was discussed today.  Patient Risk:   After full review of this patients clinical status, I feel that they are at least moderate risk at  this time.  Time:   Today, I have spent 17 minutes/ 21 seconds with the patient with telehealth technology discussing above diagnoses.     Medication Adjustments/Labs and Tests Ordered: Current medicines are reviewed at length with the patient today.  Concerns regarding medicines are outlined above.   Tests Ordered: Orders Placed This Encounter  Procedures  . BMP8+EGFR  . Hemoglobin A1c    Medication Changes: No orders of the defined types were placed in this encounter.   Disposition:  Follow up in 4 month(s)  Signed, [name], MD

## 2018-11-02 ENCOUNTER — Other Ambulatory Visit: Payer: Self-pay | Admitting: Internal Medicine

## 2018-11-02 ENCOUNTER — Other Ambulatory Visit: Payer: Self-pay

## 2018-11-02 ENCOUNTER — Ambulatory Visit: Payer: 59

## 2018-11-02 ENCOUNTER — Other Ambulatory Visit: Payer: 59

## 2018-11-02 VITALS — BP 136/88 | HR 66 | Temp 97.6°F | Ht 61.5 in | Wt 156.8 lb

## 2018-11-02 DIAGNOSIS — I129 Hypertensive chronic kidney disease with stage 1 through stage 4 chronic kidney disease, or unspecified chronic kidney disease: Secondary | ICD-10-CM

## 2018-11-03 ENCOUNTER — Encounter: Payer: Self-pay | Admitting: Internal Medicine

## 2018-11-03 LAB — BMP8+EGFR
BUN/Creatinine Ratio: 22 (ref 12–28)
BUN: 15 mg/dL (ref 8–27)
CO2: 22 mmol/L (ref 20–29)
Calcium: 9.6 mg/dL (ref 8.7–10.3)
Chloride: 103 mmol/L (ref 96–106)
Creatinine, Ser: 0.67 mg/dL (ref 0.57–1.00)
GFR calc Af Amer: 108 mL/min/{1.73_m2} (ref >59)
GFR calc non Af Amer: 94 mL/min/{1.73_m2} (ref >59)
Glucose: 83 mg/dL (ref 65–99)
Potassium: 4.7 mmol/L (ref 3.5–5.2)
Sodium: 143 mmol/L (ref 134–144)

## 2018-11-03 LAB — HEMOGLOBIN A1C
Est. average glucose Bld gHb Est-mCnc: 140 mg/dL
Hgb A1c MFr Bld: 6.5 % — ABNORMAL HIGH (ref 4.8–5.6)

## 2018-11-18 ENCOUNTER — Other Ambulatory Visit: Payer: Self-pay | Admitting: Internal Medicine

## 2018-11-18 ENCOUNTER — Encounter: Payer: Self-pay | Admitting: Internal Medicine

## 2018-12-20 ENCOUNTER — Encounter: Payer: Self-pay | Admitting: Internal Medicine

## 2019-01-19 ENCOUNTER — Other Ambulatory Visit: Payer: Self-pay | Admitting: Internal Medicine

## 2019-02-25 ENCOUNTER — Other Ambulatory Visit: Payer: Self-pay | Admitting: Internal Medicine

## 2019-03-02 ENCOUNTER — Ambulatory Visit: Payer: 59 | Admitting: Internal Medicine

## 2019-03-02 ENCOUNTER — Encounter: Payer: Self-pay | Admitting: Internal Medicine

## 2019-03-02 ENCOUNTER — Other Ambulatory Visit: Payer: Self-pay

## 2019-03-02 VITALS — BP 140/92 | HR 83 | Temp 98.2°F | Ht 61.5 in | Wt 164.0 lb

## 2019-03-02 DIAGNOSIS — Z6830 Body mass index (BMI) 30.0-30.9, adult: Secondary | ICD-10-CM

## 2019-03-02 DIAGNOSIS — I129 Hypertensive chronic kidney disease with stage 1 through stage 4 chronic kidney disease, or unspecified chronic kidney disease: Secondary | ICD-10-CM

## 2019-03-02 DIAGNOSIS — N181 Chronic kidney disease, stage 1: Secondary | ICD-10-CM

## 2019-03-02 DIAGNOSIS — E1122 Type 2 diabetes mellitus with diabetic chronic kidney disease: Secondary | ICD-10-CM

## 2019-03-02 DIAGNOSIS — E6609 Other obesity due to excess calories: Secondary | ICD-10-CM

## 2019-03-02 NOTE — Progress Notes (Signed)
Subjective:     Patient ID: Sharon Mcdonald , female    DOB: January 06, 1956 , 63 y.o.   MRN: [id number]   Chief Complaint  Patient presents with  . Diabetes  . Hypertension    HPI  Diabetes She presents for her follow-up diabetic visit. She has type 2 diabetes mellitus. Her disease course has been stable. There are no hypoglycemic associated symptoms. Pertinent negatives for diabetes include no blurred vision and no chest pain. There are no hypoglycemic complications. Diabetic complications include nephropathy. Risk factors for coronary artery disease include diabetes mellitus, dyslipidemia, hypertension and post-menopausal. She is following a diabetic diet. She participates in exercise three times a week. Her breakfast blood glucose is taken between 7-8 am. Her breakfast blood glucose range is generally 90-110 mg/dl. An ACE inhibitor/angiotensin II receptor blocker is being taken. Eye exam is current.  Hypertension This is a chronic problem. The current episode started more than 1 year ago. The problem is unchanged. The problem is controlled. Pertinent negatives include no blurred vision, chest pain, palpitations or shortness of breath. The current treatment provides moderate improvement. Compliance problems include exercise.  Hypertensive end-organ damage includes kidney disease.     Past Medical History:  Diagnosis Date  . Diabetes mellitus   . High cholesterol   . Hypertension      Family History  Problem Relation Age of Onset  . Diabetes Mother   . Hypertension Mother   . Obesity Mother   . Diabetes Brother      Current Outpatient Medications:  .  gabapentin (NEURONTIN) 300 MG capsule, TAKE 1 CAPSULE BY MOUTH EVERY DAY, Disp: 90 capsule, Rfl: 2 .  LEVEMIR FLEXTOUCH 100 UNIT/ML Pen, Inject 40 units by subcutaneous route once in the evening (Patient taking differently: 30 Units. Inject 40 units by subcutaneous route once in the evening), Disp: 45 pen, Rfl: 1 .  lisinopril  (PRINIVIL,ZESTRIL) 10 MG tablet, TAKE ONE TABLET BY MOUTH DAILY, Disp: 90 tablet, Rfl: 2 .  OZEMPIC, 1 MG/DOSE, 2 MG/1.5ML SOPN, INJECT (1MG) BY SUBCUTANEOUS ROUTE EVERY WEEK ON THE SAME DAY OF EACH WEEK, IN THE ABDOMEN, THIGHS, OR UPPER ARM ROTATING INJECTION SITES, Disp: 9 pen, Rfl: 1 .  pravastatin (PRAVACHOL) 20 MG tablet, TAKE 1 TABLET BY MOUTH EVERY DAY, Disp: 90 tablet, Rfl: 1 .  SYNJARDY XR 04-999 MG TB24, TAKE 1 TABLET BY ORAL ROUTE EVERY DAY IN THE MORNING WITH A MEAL, Disp: 30 tablet, Rfl: 5 .  timolol (BETIMOL) 0.5 % ophthalmic solution, 1 drop 2 (two) times daily., Disp: , Rfl:    Allergies  Allergen Reactions  . Penicillins      Review of Systems  Constitutional: Negative.   Eyes: Negative for blurred vision.  Respiratory: Negative.  Negative for shortness of breath.   Cardiovascular: Negative.  Negative for chest pain and palpitations.  Gastrointestinal: Negative.   Neurological: Negative.   Psychiatric/Behavioral: Negative.      Today's Vitals   03/02/19 1032  BP: (!) 140/92  Pulse: 83  Temp: 98.2 F (36.8 C)  TempSrc: Oral  Weight: 164 lb (74.4 kg)  Height: 5' 1.5" (1.562 m)   Body mass index is 30.49 kg/m.   Objective:  Physical Exam Vitals signs and nursing note reviewed.  Constitutional:      Appearance: Normal appearance.  HENT:     Head: Normocephalic and atraumatic.  Cardiovascular:     Rate and Rhythm: Normal rate and regular rhythm.     Heart sounds: Normal heart  sounds.  Pulmonary:     Effort: Pulmonary effort is normal.     Breath sounds: Normal breath sounds.  Skin:    General: Skin is warm.  Neurological:     General: No focal deficit present.     Mental Status: She is alert.  Psychiatric:        Mood and Affect: Mood normal.        Behavior: Behavior normal.         Assessment And Plan:     1. Diabetes mellitus with stage 1 chronic kidney disease ([place])  I will check labs as listed below.  She is encouraged to incorporate  more exercise into her daily routine.  She hopes to get off of meds in the near future. She admits that she has not been exercising as frequently as she has in the past. She needs letter to return to the gym. She will rto in 4 months for re-evaluation.   - Lipid panel - CMP14+EGFR - Hemoglobin A1c  2. Hypertensive nephropathy  Chronic, not controlled. She will continue with current meds. She is encouraged to avoid adding salt to her foods. Also encouraged to resume her regular exercise regimen.   3. Class 1 obesity due to excess calories with serious comorbidity and body mass index (BMI) of 30.0 to 30.9 in adult  She is encouraged to strive for BMI less than 27 to decrease cardiac risk. She was congratulated on her weight loss thus far.   [name], MD    THE PATIENT IS ENCOURAGED TO PRACTICE SOCIAL DISTANCING DUE TO THE COVID-19 PANDEMIC.

## 2019-03-03 LAB — LIPID PANEL
Chol/HDL Ratio: 2.7 ratio (ref 0.0–4.4)
Cholesterol, Total: 169 mg/dL (ref 100–199)
HDL: 62 mg/dL (ref >39)
LDL Calculated: 92 mg/dL (ref 0–99)
Triglycerides: 73 mg/dL (ref 0–149)
VLDL Cholesterol Cal: 15 mg/dL (ref 5–40)

## 2019-03-03 LAB — HEMOGLOBIN A1C
Est. average glucose Bld gHb Est-mCnc: 140 mg/dL
Hgb A1c MFr Bld: 6.5 % — ABNORMAL HIGH (ref 4.8–5.6)

## 2019-03-03 LAB — CMP14+EGFR
ALT: 12 IU/L (ref 0–32)
AST: 16 IU/L (ref 0–40)
Albumin/Globulin Ratio: 1.6 (ref 1.2–2.2)
Albumin: 4.2 g/dL (ref 3.8–4.8)
Alkaline Phosphatase: 61 IU/L (ref 39–117)
BUN/Creatinine Ratio: 10 — ABNORMAL LOW (ref 12–28)
BUN: 7 mg/dL — ABNORMAL LOW (ref 8–27)
Bilirubin Total: 0.4 mg/dL (ref 0.0–1.2)
CO2: 24 mmol/L (ref 20–29)
Calcium: 9 mg/dL (ref 8.7–10.3)
Chloride: 107 mmol/L — ABNORMAL HIGH (ref 96–106)
Creatinine, Ser: 0.71 mg/dL (ref 0.57–1.00)
GFR calc Af Amer: 105 mL/min/{1.73_m2} (ref >59)
GFR calc non Af Amer: 91 mL/min/{1.73_m2} (ref >59)
Globulin, Total: 2.7 g/dL (ref 1.5–4.5)
Glucose: 67 mg/dL (ref 65–99)
Potassium: 3.8 mmol/L (ref 3.5–5.2)
Sodium: 145 mmol/L — ABNORMAL HIGH (ref 134–144)
Total Protein: 6.9 g/dL (ref 6.0–8.5)

## 2019-03-16 ENCOUNTER — Other Ambulatory Visit: Payer: Self-pay | Admitting: Internal Medicine

## 2019-03-25 ENCOUNTER — Telehealth: Payer: Self-pay

## 2019-03-25 ENCOUNTER — Encounter: Payer: Self-pay | Admitting: Internal Medicine

## 2019-03-25 NOTE — Telephone Encounter (Signed)
Pt sent a message on mychart stating that her bp was elevated. Called pt to schedule appt pt refused appts for today and earlier times due to providers available. Pt only wants to see one specific provider.

## 2019-03-26 ENCOUNTER — Encounter: Payer: Self-pay | Admitting: Internal Medicine

## 2019-04-01 ENCOUNTER — Encounter: Payer: Self-pay | Admitting: Internal Medicine

## 2019-04-01 ENCOUNTER — Ambulatory Visit: Payer: 59 | Admitting: Internal Medicine

## 2019-04-01 ENCOUNTER — Other Ambulatory Visit: Payer: Self-pay

## 2019-04-01 VITALS — BP 128/80 | HR 89 | Temp 98.4°F | Ht 59.2 in | Wt 159.8 lb

## 2019-04-01 DIAGNOSIS — N181 Chronic kidney disease, stage 1: Secondary | ICD-10-CM

## 2019-04-01 DIAGNOSIS — Z6832 Body mass index (BMI) 32.0-32.9, adult: Secondary | ICD-10-CM | POA: Diagnosis not present

## 2019-04-01 DIAGNOSIS — I129 Hypertensive chronic kidney disease with stage 1 through stage 4 chronic kidney disease, or unspecified chronic kidney disease: Secondary | ICD-10-CM

## 2019-04-01 DIAGNOSIS — E6609 Other obesity due to excess calories: Secondary | ICD-10-CM | POA: Diagnosis not present

## 2019-04-01 MED ORDER — LISINOPRIL 20 MG PO TABS: 20.0000 mg | ORAL_TABLET | Freq: Every day | ORAL | 2 refills | Status: DC

## 2019-04-01 NOTE — Patient Instructions (Signed)
Coriander, cumin, garlic, olive oil  Brussel sprouts Broccoli Cauliflower Sweet potato

## 2019-04-01 NOTE — Progress Notes (Signed)
Subjective:     Patient ID: Sharon Mcdonald , female    DOB: 22-Dec-1955 , 63 y.o.   MRN: [id number]   Chief Complaint  Patient presents with  . Hypertension    HPI  She is here today for bp check. She called last week c/o headaches and markedly elevated bp readings. She was advised to increase her lisinopril to TWO-25m tablets daily. She has tolerated this dose without any adverse effects. She feels well on current regimen.    Past Medical History:  Diagnosis Date  . Diabetes mellitus   . High cholesterol   . Hypertension      Family History  Problem Relation Age of Onset  . Diabetes Mother   . Hypertension Mother   . Obesity Mother   . Diabetes Brother      Current Outpatient Medications:  .  BD PEN NEEDLE NANO U/F 32G X 4 MM MISC, USE WITH INSULIN DAILY AND OZEMPIC ONCE WEEKLY, Disp: 100 each, Rfl: 5 .  gabapentin (NEURONTIN) 300 MG capsule, TAKE 1 CAPSULE BY MOUTH EVERY DAY, Disp: 90 capsule, Rfl: 2 .  LEVEMIR FLEXTOUCH 100 UNIT/ML Pen, Inject 40 units by subcutaneous route once in the evening (Patient taking differently: 30 Units. Inject 40 units by subcutaneous route once in the evening), Disp: 45 pen, Rfl: 1 .  lisinopril (PRINIVIL,ZESTRIL) 10 MG tablet, TAKE ONE TABLET BY MOUTH DAILY, Disp: 90 tablet, Rfl: 2 .  OZEMPIC, 1 MG/DOSE, 2 MG/1.5ML SOPN, INJECT (1MG) BY SUBCUTANEOUS ROUTE EVERY WEEK ON THE SAME DAY OF EACH WEEK, IN THE ABDOMEN, THIGHS, OR UPPER ARM ROTATING INJECTION SITES, Disp: 9 pen, Rfl: 1 .  pravastatin (PRAVACHOL) 20 MG tablet, TAKE 1 TABLET BY MOUTH EVERY DAY, Disp: 90 tablet, Rfl: 1 .  SYNJARDY XR 04-999 MG TB24, TAKE 1 TABLET BY ORAL ROUTE EVERY DAY IN THE MORNING WITH A MEAL, Disp: 30 tablet, Rfl: 5 .  timolol (BETIMOL) 0.5 % ophthalmic solution, 1 drop 2 (two) times daily., Disp: , Rfl:    Allergies  Allergen Reactions  . Penicillins      Review of Systems  Constitutional: Negative.   Respiratory: Negative.   Cardiovascular: Negative.    Gastrointestinal: Negative.   Neurological: Negative.   Psychiatric/Behavioral: Negative.      Today's Vitals   04/01/19 1132  BP: 128/80  Pulse: 89  Temp: 98.4 F (36.9 C)  TempSrc: Oral  SpO2: 96%  Weight: 159 lb 12.8 oz (72.5 kg)  Height: 4' 11.2" (1.504 m)   Body mass index is 32.06 kg/m.   Objective:  Physical Exam Vitals signs and nursing note reviewed.  Constitutional:      Appearance: Normal appearance.  HENT:     Head: Normocephalic and atraumatic.  Cardiovascular:     Rate and Rhythm: Normal rate and regular rhythm.     Heart sounds: Normal heart sounds.  Pulmonary:     Effort: Pulmonary effort is normal.     Breath sounds: Normal breath sounds.  Skin:    General: Skin is warm.  Neurological:     General: No focal deficit present.     Mental Status: She is alert.  Psychiatric:        Mood and Affect: Mood normal.        Behavior: Behavior normal.         Assessment And Plan:     1. Hypertensive nephropathy  Chronic, improved control. I will check a BMP today. A new rx for lisinopril, 260m  will be sent to the pharmacy.   - BMP8+EGFR  2. Chronic renal disease, stage I  Chronic. I will check a renal panel.   3. Class 1 obesity due to excess calories with serious comorbidity and body mass index (BMI) of 32.0 to 32.9 in adult  She is congratulated on her 5 pound weight loss and encouraged to keep up the great work. She is encouraged to strive for BMI less than 29 to decrease cardiac risk.   [name], MD    THE PATIENT IS ENCOURAGED TO PRACTICE SOCIAL DISTANCING DUE TO THE COVID-19 PANDEMIC.

## 2019-04-02 LAB — BMP8+EGFR
BUN/Creatinine Ratio: 13 (ref 12–28)
BUN: 9 mg/dL (ref 8–27)
CO2: 26 mmol/L (ref 20–29)
Calcium: 9.7 mg/dL (ref 8.7–10.3)
Chloride: 106 mmol/L (ref 96–106)
Creatinine, Ser: 0.68 mg/dL (ref 0.57–1.00)
GFR calc Af Amer: 108 mL/min/{1.73_m2} (ref >59)
GFR calc non Af Amer: 93 mL/min/{1.73_m2} (ref >59)
Glucose: 64 mg/dL — ABNORMAL LOW (ref 65–99)
Potassium: 4.7 mmol/L (ref 3.5–5.2)
Sodium: 143 mmol/L (ref 134–144)

## 2019-04-07 ENCOUNTER — Encounter: Payer: Self-pay | Admitting: Internal Medicine

## 2019-04-08 ENCOUNTER — Encounter: Payer: Self-pay | Admitting: Internal Medicine

## 2019-04-13 ENCOUNTER — Encounter: Payer: Self-pay | Admitting: Internal Medicine

## 2019-05-05 ENCOUNTER — Other Ambulatory Visit: Payer: Self-pay | Admitting: Internal Medicine

## 2019-05-20 ENCOUNTER — Encounter: Payer: Self-pay | Admitting: Internal Medicine

## 2019-05-21 ENCOUNTER — Encounter: Payer: Self-pay | Admitting: Internal Medicine

## 2019-05-21 ENCOUNTER — Other Ambulatory Visit: Payer: Self-pay | Admitting: Internal Medicine

## 2019-06-19 ENCOUNTER — Other Ambulatory Visit: Payer: Self-pay

## 2019-06-19 ENCOUNTER — Ambulatory Visit
Admission: RE | Admit: 2019-06-19 | Discharge: 2019-06-19 | Disposition: A | Discharge status: Home / Self Care | Payer: 59 | Source: Ambulatory Visit | Attending: Internal Medicine | Admitting: Internal Medicine

## 2019-06-19 DIAGNOSIS — Z1239 Encounter for other screening for malignant neoplasm of breast: Secondary | ICD-10-CM

## 2019-06-21 ENCOUNTER — Other Ambulatory Visit: Payer: Self-pay | Admitting: Internal Medicine

## 2019-06-26 ENCOUNTER — Encounter (HOSPITAL_COMMUNITY): Payer: Self-pay | Admitting: Emergency Medicine

## 2019-06-26 ENCOUNTER — Emergency Department (HOSPITAL_COMMUNITY)
Admission: EM | Admit: 2019-06-26 | Discharge: 2019-06-26 | Disposition: A | Discharge status: Home / Self Care | Payer: 59 | Attending: Emergency Medicine | Admitting: Emergency Medicine

## 2019-06-26 ENCOUNTER — Emergency Department (HOSPITAL_COMMUNITY): Payer: 59

## 2019-06-26 ENCOUNTER — Other Ambulatory Visit: Payer: Self-pay

## 2019-06-26 DIAGNOSIS — E1122 Type 2 diabetes mellitus with diabetic chronic kidney disease: Secondary | ICD-10-CM | POA: Diagnosis not present

## 2019-06-26 DIAGNOSIS — M542 Cervicalgia: Secondary | ICD-10-CM | POA: Insufficient documentation

## 2019-06-26 DIAGNOSIS — Y939 Activity, unspecified: Secondary | ICD-10-CM | POA: Insufficient documentation

## 2019-06-26 DIAGNOSIS — S63502A Unspecified sprain of left wrist, initial encounter: Secondary | ICD-10-CM | POA: Insufficient documentation

## 2019-06-26 DIAGNOSIS — Y999 Unspecified external cause status: Secondary | ICD-10-CM | POA: Insufficient documentation

## 2019-06-26 DIAGNOSIS — N181 Chronic kidney disease, stage 1: Secondary | ICD-10-CM | POA: Insufficient documentation

## 2019-06-26 DIAGNOSIS — Z87891 Personal history of nicotine dependence: Secondary | ICD-10-CM | POA: Diagnosis not present

## 2019-06-26 DIAGNOSIS — Y929 Unspecified place or not applicable: Secondary | ICD-10-CM | POA: Diagnosis not present

## 2019-06-26 DIAGNOSIS — Z79899 Other long term (current) drug therapy: Secondary | ICD-10-CM | POA: Insufficient documentation

## 2019-06-26 DIAGNOSIS — S6992XA Unspecified injury of left wrist, hand and finger(s), initial encounter: Secondary | ICD-10-CM | POA: Diagnosis present

## 2019-06-26 DIAGNOSIS — M25512 Pain in left shoulder: Secondary | ICD-10-CM | POA: Insufficient documentation

## 2019-06-26 DIAGNOSIS — W010XXA Fall on same level from slipping, tripping and stumbling without subsequent striking against object, initial encounter: Secondary | ICD-10-CM | POA: Diagnosis not present

## 2019-06-26 DIAGNOSIS — I129 Hypertensive chronic kidney disease with stage 1 through stage 4 chronic kidney disease, or unspecified chronic kidney disease: Secondary | ICD-10-CM | POA: Insufficient documentation

## 2019-06-26 DIAGNOSIS — W19XXXA Unspecified fall, initial encounter: Secondary | ICD-10-CM

## 2019-06-26 DIAGNOSIS — Z794 Long term (current) use of insulin: Secondary | ICD-10-CM | POA: Diagnosis not present

## 2019-06-26 MED ORDER — HYDROCODONE-ACETAMINOPHEN 5-325 MG PO TABS: 1.0000 | ORAL_TABLET | ORAL | 0 refills | Status: DC | PRN

## 2019-06-26 MED ORDER — HYDROCODONE-ACETAMINOPHEN 5-325 MG PO TABS
1.0000 | ORAL_TABLET | Freq: Once | ORAL | Status: AC
  Administered 2019-06-26: 1 via ORAL
  Filled 2019-06-26: qty 1

## 2019-06-26 MED ORDER — CYCLOBENZAPRINE HCL 10 MG PO TABS: 10.0000 mg | ORAL_TABLET | Freq: Two times a day (BID) | ORAL | 0 refills | Status: DC | PRN

## 2019-06-26 MED ORDER — IBUPROFEN 400 MG PO TABS
600.0000 mg | ORAL_TABLET | Freq: Once | ORAL | Status: AC
  Administered 2019-06-26: 600 mg via ORAL
  Filled 2019-06-26: qty 1

## 2019-06-26 MED ORDER — IBUPROFEN 600 MG PO TABS: 600.0000 mg | ORAL_TABLET | Freq: Four times a day (QID) | ORAL | 0 refills | Status: DC | PRN

## 2019-06-26 NOTE — Progress Notes (Signed)
Orthopedic Tech Progress Note Patient Details:  Sharon Mcdonald 1955/11/27 [id number]  Ortho Devices Type of Ortho Device: Velcro wrist splint Ortho Device/Splint Location: lue Ortho Device/Splint Interventions: Ordered, Application, Adjustment       Karolee Stamps 06/26/2019, 11:23 PM

## 2019-06-26 NOTE — ED Provider Notes (Signed)
[facility] EMERGENCY DEPARTMENT Provider Note   CSN: [id number] Arrival date & time: 06/26/19  2047     History Chief Complaint  Patient presents with  . Fall    Sharon Mcdonald is a 63 y.o. female.  Patient to ED after fall that occurred earlier in the day. She slipped on a wet floor, falling to left side and impacting her shoulder and outstretched left wrist. She did not his her head. No LOC, nausea, vomiting. She has been ambulatory since the fall and denies hip or LE pain. No chest pain, SOB, pain with breathing or abdominal pain. She reports her shoulder and wrist pain are worse over time and worse with movement. No posterior neck pain, extremity weakness, numbness or tingling.  The history is provided by the patient. No language interpreter was used.  Fall Pertinent negatives include no chest pain, no abdominal pain and no shortness of breath.       Past Medical History:  Diagnosis Date  . Diabetes mellitus   . High cholesterol   . Hypertension     Patient Active Problem List   Diagnosis Date Noted  . Diabetes mellitus with stage 1 chronic kidney disease (HCC) 06/24/2018  . Hypertensive nephropathy 06/24/2018  . Comprehensive diabetic foot examination, type 2 DM, encounter for [facility]) 11/20/2015    Past Surgical History:  Procedure Laterality Date  . BREAST CYST EXCISION Left   . BREAST SURGERY    . REDUCTION MAMMAPLASTY Bilateral   . TONSILLECTOMY    . TUBAL LIGATION       OB History   No obstetric history on file.     Family History  Problem Relation Age of Onset  . Diabetes Mother   . Hypertension Mother   . Obesity Mother   . Diabetes Brother     Social History   Tobacco Use  . Smoking status: Former Smoker    Types: Cigarettes    Quit date: 09/07/1978    Years since quitting: 40.8  . Smokeless tobacco: Never Used  . Tobacco comment: 1/2 pack per week  Substance Use Topics  . Alcohol use: No  . Drug use: No    Home  Medications Prior to Admission medications   Medication Sig Start Date End Date Taking? Authorizing Provider  BD PEN NEEDLE NANO U/F 32G X 4 MM MISC USE WITH INSULIN DAILY AND OZEMPIC ONCE WEEKLY 03/16/19   Dorothyann Peng, MD  gabapentin (NEURONTIN) 300 MG capsule TAKE 1 CAPSULE BY MOUTH EVERY DAY 01/19/19   Dorothyann Peng, MD  LEVEMIR FLEXTOUCH 100 UNIT/ML Pen INJECT 40 UNITS BY SUBCUTANEOUS ROUTE ONCE IN THE EVENING 05/05/19   Dorothyann Peng, MD  lisinopril (ZESTRIL) 20 MG tablet Take 1 tablet (20 mg total) by mouth daily. 04/01/19 03/31/20  Dorothyann Peng, MD  OZEMPIC, 1 MG/DOSE, 2 MG/1.5ML SOPN INJECT 1MG  SUBCUTANEOUSLY EVERY WEEK ON THE SAME DAY OF EACH WEEK, IN THE ABDOMEN, THIGHS, OR UPPER ARM ROTATING INJECTION SITES 05/21/19   05/23/19, MD  pravastatin (PRAVACHOL) 20 MG tablet TAKE 1 TABLET BY MOUTH EVERY DAY 02/25/19   02/27/19, MD  SYNJARDY XR 04-999 MG TB24 TAKE 1 TABLET BY ORAL ROUTE EVERY DAY IN THE MORNING WITH A MEAL 06/21/19   06/23/19, MD  timolol (BETIMOL) 0.5 % ophthalmic solution 1 drop 2 (two) times daily.    [provider]    Allergies    Penicillins  Review of Systems   Review of Systems  Constitutional:  Negative for diaphoresis.  Respiratory: Negative.  Negative for shortness of breath.   Cardiovascular: Negative.  Negative for chest pain.  Gastrointestinal: Negative.  Negative for abdominal pain, nausea and vomiting.  Musculoskeletal:       See HPI.  Skin: Negative.  Negative for color change and wound.  Neurological: Negative.  Negative for syncope, weakness and numbness.    Physical Exam Updated Vital Signs BP (!) 185/90 (BP Location: Right Arm)   Pulse 100   Temp 97.9 F (36.6 C) (Oral)   Resp 18   Ht 5' 1.5" (1.562 m)   Wt 71.2 kg   SpO2 100%   BMI 29.18 kg/m   Physical Exam Constitutional:      Appearance: She is well-developed.  HENT:     Head: Atraumatic.  Cardiovascular:     Pulses: Normal pulses.  Pulmonary:      Effort: Pulmonary effort is normal.  Musculoskeletal:     Cervical back: Normal range of motion.     Comments: No midline cervical tenderness. There is mild left lateral neck tenderness that extends to deltoid shoulder. No bony deformity or clavicular tenderness. ROM of shoulder is limited on abduction due to pain. No humeral, elbow or forearm tenderness or deformity. Wrist is tender with minimal swelling. FROM all digits of the left hand.   Skin:    General: Skin is warm and dry.     Findings: No bruising, erythema or lesion.  Neurological:     Mental Status: She is alert and oriented to person, place, and time.     Sensory: No sensory deficit.     ED Results / Procedures / Treatments   Labs (all labs ordered are listed, but only abnormal results are displayed) Labs Reviewed - No data to display  EKG None  Radiology DG Wrist Complete Left  Result Date: 06/26/2019 CLINICAL DATA:  Left wrist pain after fall. EXAM: LEFT WRIST - COMPLETE 3+ VIEW COMPARISON:  None. FINDINGS: There is no evidence of fracture or dislocation. Mild degenerative change at the thumb carpal metacarpal joint and triscaphe joint. Soft tissues are unremarkable. IMPRESSION: No fracture or subluxation of the left wrist. Electronically Signed   By: Keith Rake M.D.   On: 06/26/2019 21:59   DG Shoulder Left  Result Date: 06/26/2019 CLINICAL DATA:  Left shoulder pain after fall. EXAM: LEFT SHOULDER - 2+ VIEW COMPARISON:  None. FINDINGS: There is no evidence of fracture or dislocation. Trace glenohumeral spurring. Soft tissues are unremarkable. IMPRESSION: No fracture or subluxation of the left shoulder. Trace glenohumeral spurring. Electronically Signed   By: Keith Rake M.D.   On: 06/26/2019 21:58    Procedures Procedures (including critical care time)  Medications Ordered in ED Medications - No data to display  ED Course  I have reviewed the triage vital signs and the nursing notes.  Pertinent  labs & imaging results that were available during my care of the patient were reviewed by me and considered in my medical decision making (see chart for details).    MDM Rules/Calculators/A&P                      Patient to ED after fall onto left shoulder and wrist.   Imaging is negative for fracture. Will provide supportive care with wrist splint, anti-inflammatory and muscle relaxer medications. She has an orthopedist - recommend f/u if pain persists.   Final Clinical Impression(s) / ED Diagnoses Final diagnoses:  None   1. Fall,  mechanical 2. Left shoulder pain 3. Left wrist sprain  Rx / DC Orders ED Discharge Orders    None       Elpidio AnisUpstill, Walter Min, Cordelia Poche-C 06/26/19 2302    Raeford RazorKohut, Stephen, MD 07/04/19 [phone]

## 2019-06-26 NOTE — ED Triage Notes (Signed)
Pt states she fell on a [place] and hit her left side, now she is having 7/10 pain on left arm and wrist. No swollen or deformity noticed on triage.

## 2019-06-26 NOTE — Discharge Instructions (Addendum)
Take medications as prescribed. If pain is no better in 3-4 days follow up with your orthopedist for re-examination.   Wear the wrist splint for comfort as needed. Return to the ED with any new or concerning symptoms.

## 2019-06-30 ENCOUNTER — Ambulatory Visit (INDEPENDENT_AMBULATORY_CARE_PROVIDER_SITE_OTHER): Payer: 59 | Admitting: Internal Medicine

## 2019-06-30 ENCOUNTER — Other Ambulatory Visit: Payer: Self-pay

## 2019-06-30 ENCOUNTER — Encounter: Payer: Self-pay | Admitting: Internal Medicine

## 2019-06-30 VITALS — BP 142/86 | HR 81 | Temp 98.7°F | Ht 59.6 in | Wt 158.4 lb

## 2019-06-30 DIAGNOSIS — R319 Hematuria, unspecified: Secondary | ICD-10-CM

## 2019-06-30 DIAGNOSIS — Y92513 Shop (commercial) as the place of occurrence of the external cause: Secondary | ICD-10-CM

## 2019-06-30 DIAGNOSIS — Z712 Person consulting for explanation of examination or test findings: Secondary | ICD-10-CM

## 2019-06-30 DIAGNOSIS — N181 Chronic kidney disease, stage 1: Secondary | ICD-10-CM

## 2019-06-30 DIAGNOSIS — E1122 Type 2 diabetes mellitus with diabetic chronic kidney disease: Secondary | ICD-10-CM

## 2019-06-30 DIAGNOSIS — M25532 Pain in left wrist: Secondary | ICD-10-CM | POA: Diagnosis not present

## 2019-06-30 DIAGNOSIS — M25512 Pain in left shoulder: Secondary | ICD-10-CM

## 2019-06-30 DIAGNOSIS — I129 Hypertensive chronic kidney disease with stage 1 through stage 4 chronic kidney disease, or unspecified chronic kidney disease: Secondary | ICD-10-CM

## 2019-06-30 DIAGNOSIS — H6123 Impacted cerumen, bilateral: Secondary | ICD-10-CM

## 2019-06-30 DIAGNOSIS — N390 Urinary tract infection, site not specified: Secondary | ICD-10-CM | POA: Diagnosis not present

## 2019-06-30 DIAGNOSIS — Z0000 Encounter for general adult medical examination without abnormal findings: Secondary | ICD-10-CM | POA: Diagnosis not present

## 2019-06-30 LAB — POCT URINALYSIS DIPSTICK
Bilirubin, UA: NEGATIVE
Glucose, UA: POSITIVE — AB
Ketones, UA: NEGATIVE
Nitrite, UA: POSITIVE
Protein, UA: NEGATIVE
Spec Grav, UA: 1.025 (ref 1.010–1.025)
Urobilinogen, UA: 0.2 E.U./dL
pH, UA: 5.5 (ref 5.0–8.0)

## 2019-06-30 LAB — POCT UA - MICROALBUMIN
Albumin/Creatinine Ratio, Urine, POC: 30
Creatinine, POC: 100 mg/dL
Microalbumin Ur, POC: 30 mg/L

## 2019-06-30 MED ORDER — FLUCONAZOLE 150 MG PO TABS: 150.0000 mg | ORAL_TABLET | Freq: Every day | ORAL | 1 refills | Status: DC

## 2019-06-30 NOTE — Patient Instructions (Signed)
Health Maintenance, Female Adopting a healthy lifestyle and getting preventive care are important in promoting health and wellness. Ask your health care provider about:  The right schedule for you to have regular tests and exams.  Things you can do on your own to prevent diseases and keep yourself healthy. What should I know about diet, weight, and exercise? Eat a healthy diet   Eat a diet that includes plenty of vegetables, fruits, low-fat dairy products, and lean protein.  Do not eat a lot of foods that are high in solid fats, added sugars, or sodium. Maintain a healthy weight Body mass index (BMI) is used to identify weight problems. It estimates body fat based on height and weight. Your health care provider can help determine your BMI and help you achieve or maintain a healthy weight. Get regular exercise Get regular exercise. This is one of the most important things you can do for your health. Most adults should:  Exercise for at least 150 minutes each week. The exercise should increase your heart rate and make you sweat (moderate-intensity exercise).  Do strengthening exercises at least twice a week. This is in addition to the moderate-intensity exercise.  Spend less time sitting. Even light physical activity can be beneficial. Watch cholesterol and blood lipids Have your blood tested for lipids and cholesterol at 63 years of age, then have this test every 5 years. Have your cholesterol levels checked more often if:  Your lipid or cholesterol levels are high.  You are older than 63 years of age.  You are at high risk for heart disease. What should I know about cancer screening? Depending on your health history and family history, you may need to have cancer screening at various ages. This may include screening for:  Breast cancer.  Cervical cancer.  Colorectal cancer.  Skin cancer.  Lung cancer. What should I know about heart disease, diabetes, and high blood  pressure? Blood pressure and heart disease  High blood pressure causes heart disease and increases the risk of stroke. This is more likely to develop in people who have high blood pressure readings, are of African descent, or are overweight.  Have your blood pressure checked: ? Every 3-5 years if you are 18-39 years of age. ? Every year if you are 40 years old or older. Diabetes Have regular diabetes screenings. This checks your fasting blood sugar level. Have the screening done:  Once every three years after age 40 if you are at a normal weight and have a low risk for diabetes.  More often and at a younger age if you are overweight or have a high risk for diabetes. What should I know about preventing infection? Hepatitis B If you have a higher risk for hepatitis B, you should be screened for this virus. Talk with your health care provider to find out if you are at risk for hepatitis B infection. Hepatitis C Testing is recommended for:  Everyone born from 1945 through 1965.  Anyone with known risk factors for hepatitis C. Sexually transmitted infections (STIs)  Get screened for STIs, including gonorrhea and chlamydia, if: ? You are sexually active and are younger than 63 years of age. ? You are older than 63 years of age and your health care provider tells you that you are at risk for this type of infection. ? Your sexual activity has changed since you were last screened, and you are at increased risk for chlamydia or gonorrhea. Ask your health care provider if   you are at risk.  Ask your health care provider about whether you are at high risk for HIV. Your health care provider may recommend a prescription medicine to help prevent HIV infection. If you choose to take medicine to prevent HIV, you should first get tested for HIV. You should then be tested every 3 months for as long as you are taking the medicine. Pregnancy  If you are about to stop having your period (premenopausal) and  you may become pregnant, seek counseling before you get pregnant.  Take 400 to 800 micrograms (mcg) of folic acid every day if you become pregnant.  Ask for birth control (contraception) if you want to prevent pregnancy. Osteoporosis and menopause Osteoporosis is a disease in which the bones lose minerals and strength with aging. This can result in bone fractures. If you are 65 years old or older, or if you are at risk for osteoporosis and fractures, ask your health care provider if you should:  Be screened for bone loss.  Take a calcium or vitamin D supplement to lower your risk of fractures.  Be given hormone replacement therapy (HRT) to treat symptoms of menopause. Follow these instructions at home: Lifestyle  Do not use any products that contain nicotine or tobacco, such as cigarettes, e-cigarettes, and chewing tobacco. If you need help quitting, ask your health care provider.  Do not use street drugs.  Do not share needles.  Ask your health care provider for help if you need support or information about quitting drugs. Alcohol use  Do not drink alcohol if: ? Your health care provider tells you not to drink. ? You are pregnant, may be pregnant, or are planning to become pregnant.  If you drink alcohol: ? Limit how much you use to 0-1 drink a day. ? Limit intake if you are breastfeeding.  Be aware of how much alcohol is in your drink. In the [place] one drink equals one 12 oz bottle of beer (355 mL), one 5 oz glass of wine (148 mL), or one 1 oz glass of hard liquor (44 mL). General instructions  Schedule regular health, dental, and eye exams.  Stay current with your vaccines.  Tell your health care provider if: ? You often feel depressed. ? You have ever been abused or do not feel safe at home. Summary  Adopting a healthy lifestyle and getting preventive care are important in promoting health and wellness.  Follow your health care provider's instructions about healthy  diet, exercising, and getting tested or screened for diseases.  Follow your health care provider's instructions on monitoring your cholesterol and blood pressure. This information is not intended to replace advice given to you by your health care provider. Make sure you discuss any questions you have with your health care provider. Document Released: 01/07/2011 Document Revised: 06/17/2018 Document Reviewed: 06/17/2018 [employer]vier Patient Education  2020 [employer]  

## 2019-07-01 LAB — CMP14+EGFR
ALT: 14 IU/L (ref 0–32)
AST: 17 IU/L (ref 0–40)
Albumin/Globulin Ratio: 1.5 (ref 1.2–2.2)
Albumin: 4.2 g/dL (ref 3.8–4.8)
Alkaline Phosphatase: 59 IU/L (ref 39–117)
BUN/Creatinine Ratio: 19 (ref 12–28)
BUN: 12 mg/dL (ref 8–27)
Bilirubin Total: 0.4 mg/dL (ref 0.0–1.2)
CO2: 25 mmol/L (ref 20–29)
Calcium: 9.6 mg/dL (ref 8.7–10.3)
Chloride: 106 mmol/L (ref 96–106)
Creatinine, Ser: 0.64 mg/dL (ref 0.57–1.00)
GFR calc Af Amer: 110 mL/min/{1.73_m2} (ref >59)
GFR calc non Af Amer: 95 mL/min/{1.73_m2} (ref >59)
Globulin, Total: 2.8 g/dL (ref 1.5–4.5)
Glucose: 71 mg/dL (ref 65–99)
Potassium: 4.1 mmol/L (ref 3.5–5.2)
Sodium: 141 mmol/L (ref 134–144)
Total Protein: 7 g/dL (ref 6.0–8.5)

## 2019-07-01 LAB — CBC
Hematocrit: 39.2 % (ref 34.0–46.6)
Hemoglobin: 12.6 g/dL (ref 11.1–15.9)
MCH: 27.1 pg (ref 26.6–33.0)
MCHC: 32.1 g/dL (ref 31.5–35.7)
MCV: 84 fL (ref 79–97)
Platelets: 213 10*3/uL (ref 150–450)
RBC: 4.65 x10E6/uL (ref 3.77–5.28)
RDW: 13.2 % (ref 11.7–15.4)
WBC: 6.4 10*3/uL (ref 3.4–10.8)

## 2019-07-01 LAB — HEMOGLOBIN A1C
Est. average glucose Bld gHb Est-mCnc: 137 mg/dL
Hgb A1c MFr Bld: 6.4 % — ABNORMAL HIGH (ref 4.8–5.6)

## 2019-07-02 LAB — URINE CULTURE

## 2019-07-03 ENCOUNTER — Other Ambulatory Visit: Payer: Self-pay | Admitting: Internal Medicine

## 2019-07-03 MED ORDER — NITROFURANTOIN MONOHYD MACRO 100 MG PO CAPS: 100.0000 mg | ORAL_CAPSULE | Freq: Two times a day (BID) | ORAL | 0 refills | Status: AC

## 2019-07-03 NOTE — Progress Notes (Signed)
This visit occurred during the SARS-CoV-2 public health emergency.  Safety protocols were in place, including screening questions prior to the visit, additional usage of staff PPE, and extensive cleaning of exam room while observing appropriate contact time as indicated for disinfecting solutions.  Subjective:     Patient ID: Sharon Mcdonald , female    DOB: Aug 24, 1955 , 63 y.o.   MRN: [id number]   Chief Complaint  Patient presents with  . Annual Exam  . Diabetes  . Hypertension    HPI  She is here today for a full physical examination. She is followed by GYN for her pelvic exams. She reports she fell this past Saturday on a wet floor while she was in nail salon. She went to ER for further evaluation b/c of left wrist and shoulder pain. She was advised that she did not have any fractures. She has yet to see Ortho for f/u. She reports her left wrist pain is improving, but her shoulder pain has persisted.   Diabetes She presents for her follow-up diabetic visit. She has type 2 diabetes mellitus. Her disease course has been stable. There are no hypoglycemic associated symptoms. Pertinent negatives for diabetes include no blurred vision and no chest pain. There are no hypoglycemic complications. Diabetic complications include nephropathy. Risk factors for coronary artery disease include diabetes mellitus, dyslipidemia, hypertension and post-menopausal. She is following a diabetic diet. She participates in exercise three times a week. Her breakfast blood glucose is taken between 7-8 am. Her breakfast blood glucose range is generally 90-110 mg/dl. An ACE inhibitor/angiotensin II receptor blocker is being taken. Eye exam is current.  Hypertension This is a chronic problem. The current episode started more than 1 year ago. The problem is unchanged. The problem is controlled. Pertinent negatives include no blurred vision, chest pain, palpitations or shortness of breath. The current treatment provides  moderate improvement. Compliance problems include exercise.  Hypertensive end-organ damage includes kidney disease.     Past Medical History:  Diagnosis Date  . Diabetes mellitus   . High cholesterol   . Hypertension      Family History  Problem Relation Age of Onset  . Diabetes Mother   . Hypertension Mother   . Obesity Mother   . Diabetes Brother      Current Outpatient Medications:  .  BD PEN NEEDLE NANO U/F 32G X 4 MM MISC, USE WITH INSULIN DAILY AND OZEMPIC ONCE WEEKLY, Disp: 100 each, Rfl: 5 .  cyclobenzaprine (FLEXERIL) 10 MG tablet, Take 1 tablet (10 mg total) by mouth 2 (two) times daily as needed for muscle spasms., Disp: 20 tablet, Rfl: 0 .  gabapentin (NEURONTIN) 300 MG capsule, TAKE 1 CAPSULE BY MOUTH EVERY DAY, Disp: 90 capsule, Rfl: 2 .  HYDROcodone-acetaminophen (NORCO/VICODIN) 5-325 MG tablet, Take 1 tablet by mouth every 4 (four) hours as needed for severe pain., Disp: 10 tablet, Rfl: 0 .  ibuprofen (ADVIL) 600 MG tablet, Take 1 tablet (600 mg total) by mouth every 6 (six) hours as needed., Disp: 30 tablet, Rfl: 0 .  LEVEMIR FLEXTOUCH 100 UNIT/ML Pen, INJECT 40 UNITS BY SUBCUTANEOUS ROUTE ONCE IN THE EVENING (Patient taking differently: 30 Units. Inject 30 units by subcutaneous route once in the evening), Disp: 15 mL, Rfl: 2 .  lisinopril (ZESTRIL) 20 MG tablet, Take 1 tablet (20 mg total) by mouth daily., Disp: 90 tablet, Rfl: 2 .  OZEMPIC, 1 MG/DOSE, 2 MG/1.5ML SOPN, INJECT 1MG SUBCUTANEOUSLY EVERY WEEK ON THE SAME DAY OF EACH  WEEK, IN THE ABDOMEN, THIGHS, OR UPPER ARM ROTATING INJECTION SITES, Disp: 6 pen, Rfl: 1 .  pravastatin (PRAVACHOL) 20 MG tablet, TAKE 1 TABLET BY MOUTH EVERY DAY, Disp: 90 tablet, Rfl: 1 .  SYNJARDY XR 04-999 MG TB24, TAKE 1 TABLET BY ORAL ROUTE EVERY DAY IN THE MORNING WITH A MEAL, Disp: 30 tablet, Rfl: 2 .  timolol (BETIMOL) 0.5 % ophthalmic solution, 1 drop 2 (two) times daily., Disp: , Rfl:  .  fluconazole (DIFLUCAN) 150 MG tablet, Take  1 tablet (150 mg total) by mouth daily., Disp: 1 tablet, Rfl: 1 .  nitrofurantoin, macrocrystal-monohydrate, (MACROBID) 100 MG capsule, Take 1 capsule (100 mg total) by mouth 2 (two) times daily for 7 days., Disp: 14 capsule, Rfl: 0   Allergies  Allergen Reactions  . Penicillins      The patient states she uses post menopausal status for birth control. Last LMP was No LMP recorded. Patient is postmenopausal.. Negative for Dysmenorrhea. Negative for: breast discharge, breast lump(s), breast pain and breast self exam. Associated symptoms include abnormal vaginal bleeding. Pertinent negatives include abnormal bleeding (hematology), anxiety, decreased libido, depression, difficulty falling sleep, dyspareunia, history of infertility, nocturia, sexual dysfunction, sleep disturbances, urinary incontinence, urinary urgency, vaginal discharge and vaginal itching. Diet regular.The patient states her exercise level is   intermittent during the pandemic. She previously went to gym on a regular basis.   . The patient's tobacco use is:  Social History   Tobacco Use  Smoking Status Former Smoker  . Types: Cigarettes  . Quit date: 09/07/1978  . Years since quitting: 40.8  Smokeless Tobacco Never Used  Tobacco Comment   1/2 pack per week  . She has been exposed to passive smoke. The patient's alcohol use is:  Social History   Substance and Sexual Activity  Alcohol Use No     Review of Systems  Eyes: Negative for blurred vision.  Respiratory: Negative for shortness of breath.   Cardiovascular: Negative for chest pain and palpitations.     Today's Vitals   06/30/19 1404  BP: (!) 142/86  Pulse: 81  Temp: 98.7 F (37.1 C)  TempSrc: Oral  Weight: 158 lb 6.4 oz (71.8 kg)  Height: 4' 11.6" (1.514 m)   Body mass index is 31.35 kg/m.   Objective:  Physical Exam Vitals and nursing note reviewed.  Constitutional:      Appearance: Normal appearance.  HENT:     Head: Normocephalic and atraumatic.      Right Ear: Ear canal and external ear normal. There is impacted cerumen.     Left Ear: Ear canal and external ear normal. There is impacted cerumen.     Ears:     Comments: Both ears with hard, impacted cerumen.     Nose:     Comments: Deferred, masked    Mouth/Throat:     Comments: Deferred, masked Eyes:     Extraocular Movements: Extraocular movements intact.     Conjunctiva/sclera: Conjunctivae normal.     Pupils: Pupils are equal, round, and reactive to light.  Cardiovascular:     Rate and Rhythm: Normal rate and regular rhythm.     Pulses: Normal pulses.          Dorsalis pedis pulses are 2+ on the right side and 2+ on the left side.     Heart sounds: Normal heart sounds.  Pulmonary:     Effort: Pulmonary effort is normal.     Breath sounds: Normal breath sounds.  Chest:  Breasts: Tanner Score is 5.        Right: Normal.        Left: Normal.  Abdominal:     General: Bowel sounds are normal.     Palpations: Abdomen is soft.  Genitourinary:    Comments: deferred Musculoskeletal:        General: Normal range of motion.     Right shoulder: Normal.     Left shoulder: Tenderness present.     Left wrist: Tenderness present.     Cervical back: Normal range of motion and neck supple.  Feet:     Right foot:     Protective Sensation: 5 sites tested. 5 sites sensed.     Skin integrity: Skin integrity normal.     Toenail Condition: Right toenails are normal.     Left foot:     Protective Sensation: 5 sites tested. 5 sites sensed.     Skin integrity: Skin integrity normal.     Toenail Condition: Left toenails are normal.  Skin:    General: Skin is warm and dry.  Neurological:     General: No focal deficit present.     Mental Status: She is alert and oriented to person, place, and time.  Psychiatric:        Mood and Affect: Mood normal.        Behavior: Behavior normal.         Assessment And Plan:     1. Routine general medical examination at health care  facility  A full exam was performed.  Importance of monthly self breast exams was discussed with the patient. PATIENT HAS BEEN ADVISED TO GET 30-45 MINUTES REGULAR EXERCISE NO LESS THAN FOUR TO FIVE DAYS PER WEEK - BOTH WEIGHTBEARING EXERCISES AND AEROBIC ARE RECOMMENDED.  HE/SHE WAS ADVISED TO FOLLOW A HEALTHY DIET WITH AT LEAST SIX FRUITS/VEGGIES PER DAY, DECREASE INTAKE OF RED MEAT, AND TO INCREASE FISH INTAKE TO TWO DAYS PER WEEK.  MEATS/FISH SHOULD NOT BE FRIED, BAKED OR BROILED IS PREFERABLE.  I SUGGEST WEARING SPF 50 SUNSCREEN ON EXPOSED PARTS AND ESPECIALLY WHEN IN THE DIRECT SUNLIGHT FOR AN EXTENDED PERIOD OF TIME.  PLEASE AVOID FAST FOOD RESTAURANTS AND INCREASE YOUR WATER INTAKE.  - CMP14+EGFR - CBC - Hemoglobin A1c  2. Diabetes mellitus with stage 1 chronic kidney disease ([place])  Diabetic foot exam was performed. She reports having eye exam this year. I will request her records from DR. Groat.  THE GOALS OF GLYCEMIC CONTROL AND POSSIBLE LONG-TERM COMPLICATIONS OF DM HAVE BEEN DISCUSSED WITH THE PATIENT.  I  ALSO STRESSED THE IMPORTANCE OF COMPLIANCE WITH HOME GLUCOSE MONITORING, DIETARY RESTRICTIONS INCLUDING AVOIDANCE OF SUGARY DRINKS/PROCESSED FOODS,  ALONG WITH REGULAR EXERCISE.  I  ALSO STRESSED THE IMPORTANCE OF ANNUAL EYE EXAMS, SELF FOOT CARE AND COMPLIANCE WITH OFFICE VISITS.  - POCT Urinalysis Dipstick (81002) - POCT UA - Microalbumin - EKG 12-Lead  3. Hypertensive nephropathy  Chronic, uncontrolled. She will continue with current meds. She is encouraged to avoid adding salt to her foods. EKG performed, no acute changes noted.   4. Left wrist pain  S/p fall on 06/26/2019. ER records reviewed in full detail. She is advised to apply Voltaren gel to painful area two to three times daily as needed.   5. Acute pain of left shoulder  Please see #4. She was also given information on Ortho Urgent Care. Pt advised that she may benefit from PT as well.   6. Bilateral impacted  cerumen  AFTER OBTAINING VERBAL CONSENT, BOTH EARS WERE FLUSHED BY IRRIGATION. SHE TOLERATED PROCEDURE WELL WITHOUT ANY COMPLICATIONS. NO TM ABNORMALITIES WERE NOTED.   7. Urinary tract infection with hematuria, site unspecified  Urinalysis abnormal. I will send urine off for culture.   - Urine culture        [name], MD    THE PATIENT IS ENCOURAGED TO PRACTICE SOCIAL DISTANCING DUE TO THE COVID-19 PANDEMIC.

## 2019-08-02 LAB — HM DIABETES EYE EXAM

## 2019-08-31 ENCOUNTER — Telehealth: Payer: Self-pay | Admitting: *Deleted

## 2019-08-31 NOTE — Telephone Encounter (Signed)
A message was left, re: her follow up visit. 

## 2019-09-01 ENCOUNTER — Other Ambulatory Visit: Payer: Self-pay | Admitting: Internal Medicine

## 2019-09-13 ENCOUNTER — Encounter: Payer: Self-pay | Admitting: Internal Medicine

## 2019-09-14 ENCOUNTER — Encounter: Payer: Self-pay | Admitting: Internal Medicine

## 2019-09-14 ENCOUNTER — Other Ambulatory Visit: Payer: Self-pay

## 2019-09-14 ENCOUNTER — Other Ambulatory Visit: Payer: Self-pay | Admitting: Internal Medicine

## 2019-09-14 MED ORDER — FREESTYLE SYSTEM KIT: PACK | 3 refills | Status: DC

## 2019-09-14 MED ORDER — ACCU-CHEK NANO SMARTVIEW W/DEVICE KIT: PACK | 3 refills | Status: DC

## 2019-09-14 MED ORDER — GLUCOSE BLOOD VI STRP: ORAL_STRIP | 12 refills | Status: DC

## 2019-09-16 ENCOUNTER — Other Ambulatory Visit: Payer: Self-pay

## 2019-09-16 MED ORDER — FREESTYLE LITE TEST VI STRP: ORAL_STRIP | 3 refills | Status: DC

## 2019-09-16 MED ORDER — FREESTYLE LANCETS MISC: 3 refills | Status: DC

## 2019-09-29 ENCOUNTER — Other Ambulatory Visit: Payer: Self-pay | Admitting: Internal Medicine

## 2019-09-30 ENCOUNTER — Other Ambulatory Visit: Payer: Self-pay

## 2019-09-30 ENCOUNTER — Encounter: Payer: Self-pay | Admitting: Internal Medicine

## 2019-09-30 ENCOUNTER — Ambulatory Visit (INDEPENDENT_AMBULATORY_CARE_PROVIDER_SITE_OTHER): Payer: BC Managed Care – PPO | Admitting: Internal Medicine

## 2019-09-30 VITALS — BP 132/84 | HR 87 | Temp 98.0°F | Ht 60.0 in | Wt 161.8 lb

## 2019-09-30 DIAGNOSIS — E1122 Type 2 diabetes mellitus with diabetic chronic kidney disease: Secondary | ICD-10-CM

## 2019-09-30 DIAGNOSIS — Z6831 Body mass index (BMI) 31.0-31.9, adult: Secondary | ICD-10-CM

## 2019-09-30 DIAGNOSIS — N181 Chronic kidney disease, stage 1: Secondary | ICD-10-CM | POA: Diagnosis not present

## 2019-09-30 DIAGNOSIS — I129 Hypertensive chronic kidney disease with stage 1 through stage 4 chronic kidney disease, or unspecified chronic kidney disease: Secondary | ICD-10-CM

## 2019-09-30 DIAGNOSIS — Z794 Long term (current) use of insulin: Secondary | ICD-10-CM

## 2019-09-30 DIAGNOSIS — E6609 Other obesity due to excess calories: Secondary | ICD-10-CM | POA: Diagnosis not present

## 2019-09-30 NOTE — Patient Instructions (Signed)
Diabetes Mellitus and Exercise Exercising regularly is important for your overall health, especially when you have diabetes (diabetes mellitus). Exercising is not only about losing weight. It has many other health benefits, such as increasing muscle strength and bone density and reducing body fat and stress. This leads to improved fitness, flexibility, and endurance, all of which result in better overall health. Exercise has additional benefits for people with diabetes, including:  Reducing appetite.  Helping to lower and control blood glucose.  Lowering blood pressure.  Helping to control amounts of fatty substances (lipids) in the blood, such as cholesterol and triglycerides.  Helping the body to respond better to insulin (improving insulin sensitivity).  Reducing how much insulin the body needs.  Decreasing the risk for heart disease by: ? Lowering cholesterol and triglyceride levels. ? Increasing the levels of good cholesterol. ? Lowering blood glucose levels. What is my activity plan? Your health care provider or certified diabetes educator can help you make a plan for the type and frequency of exercise (activity plan) that works for you. Make sure that you:  Do at least 150 minutes of moderate-intensity or vigorous-intensity exercise each week. This could be brisk walking, biking, or water aerobics. ? Do stretching and strength exercises, such as yoga or weightlifting, at least 2 times a week. ? Spread out your activity over at least 3 days of the week.  Get some form of physical activity every day. ? Do not go more than 2 days in a row without some kind of physical activity. ? Avoid being inactive for more than 30 minutes at a time. Take frequent breaks to walk or stretch.  Choose a type of exercise or activity that you enjoy, and set realistic goals.  Start slowly, and gradually increase the intensity of your exercise over time. What do I need to know about managing my  diabetes?   Check your blood glucose before and after exercising. ? If your blood glucose is 240 mg/dL (13.3 mmol/L) or higher before you exercise, check your urine for ketones. If you have ketones in your urine, do not exercise until your blood glucose returns to normal. ? If your blood glucose is 100 mg/dL (5.6 mmol/L) or lower, eat a snack containing 15-20 grams of carbohydrate. Check your blood glucose 15 minutes after the snack to make sure that your level is above 100 mg/dL (5.6 mmol/L) before you start your exercise.  Know the symptoms of low blood glucose (hypoglycemia) and how to treat it. Your risk for hypoglycemia increases during and after exercise. Common symptoms of hypoglycemia can include: ? Hunger. ? Anxiety. ? Sweating and feeling clammy. ? Confusion. ? Dizziness or feeling light-headed. ? Increased heart rate or palpitations. ? Blurry vision. ? Tingling or numbness around the mouth, lips, or tongue. ? Tremors or shakes. ? Irritability.  Keep a rapid-acting carbohydrate snack available before, during, and after exercise to help prevent or treat hypoglycemia.  Avoid injecting insulin into areas of the body that are going to be exercised. For example, avoid injecting insulin into: ? The arms, when playing tennis. ? The legs, when jogging.  Keep records of your exercise habits. Doing this can help you and your health care provider adjust your diabetes management plan as needed. Write down: ? Food that you eat before and after you exercise. ? Blood glucose levels before and after you exercise. ? The type and amount of exercise you have done. ? When your insulin is expected to peak, if you use   insulin. Avoid exercising at times when your insulin is peaking.  When you start a new exercise or activity, work with your health care provider to make sure the activity is safe for you, and to adjust your insulin, medicines, or food intake as needed.  Drink plenty of water while  you exercise to prevent dehydration or heat stroke. Drink enough fluid to keep your urine clear or pale yellow. Summary  Exercising regularly is important for your overall health, especially when you have diabetes (diabetes mellitus).  Exercising has many health benefits, such as increasing muscle strength and bone density and reducing body fat and stress.  Your health care provider or certified diabetes educator can help you make a plan for the type and frequency of exercise (activity plan) that works for you.  When you start a new exercise or activity, work with your health care provider to make sure the activity is safe for you, and to adjust your insulin, medicines, or food intake as needed. This information is not intended to replace advice given to you by your health care provider. Make sure you discuss any questions you have with your health care provider. Document Revised: 01/16/2017 Document Reviewed: 12/04/2015 Elsevier Patient Education  2020 [employer]  

## 2019-10-01 ENCOUNTER — Encounter: Payer: Self-pay | Admitting: Internal Medicine

## 2019-10-01 ENCOUNTER — Other Ambulatory Visit: Payer: Self-pay

## 2019-10-01 LAB — BMP8+EGFR
BUN/Creatinine Ratio: 16 (ref 12–28)
BUN: 11 mg/dL (ref 8–27)
CO2: 25 mmol/L (ref 20–29)
Calcium: 9.4 mg/dL (ref 8.7–10.3)
Chloride: 105 mmol/L (ref 96–106)
Creatinine, Ser: 0.69 mg/dL (ref 0.57–1.00)
GFR calc Af Amer: 106 mL/min/{1.73_m2} (ref >59)
GFR calc non Af Amer: 92 mL/min/{1.73_m2} (ref >59)
Glucose: 71 mg/dL (ref 65–99)
Potassium: 4.4 mmol/L (ref 3.5–5.2)
Sodium: 143 mmol/L (ref 134–144)

## 2019-10-01 LAB — HEMOGLOBIN A1C
Est. average glucose Bld gHb Est-mCnc: 151 mg/dL
Hgb A1c MFr Bld: 6.9 % — ABNORMAL HIGH (ref 4.8–5.6)

## 2019-10-01 MED ORDER — LEVEMIR FLEXTOUCH 100 UNIT/ML ~~LOC~~ SOPN: 27.0000 [IU] | PEN_INJECTOR | Freq: Every day | SUBCUTANEOUS | 1 refills | Status: DC

## 2019-10-01 NOTE — Progress Notes (Signed)
This visit occurred during the SARS-CoV-2 public health emergency.  Safety protocols were in place, including screening questions prior to the visit, additional usage of staff PPE, and extensive cleaning of exam room while observing appropriate contact time as indicated for disinfecting solutions.  Subjective:     Patient ID: Sharon Mcdonald , female    DOB: 1956/02/14 , 64 y.o.   MRN: [id number]   Chief Complaint  Patient presents with  . Diabetes  . Hypertension    HPI  She presents today for DM check. She reports that she is interested in coming off of insulin (all meds if possible). She is tired of sticking herself. She is not yet exercising, but plans to resume in the near future.   Diabetes She presents for her follow-up diabetic visit. She has type 2 diabetes mellitus. Her disease course has been stable. There are no hypoglycemic associated symptoms. Pertinent negatives for diabetes include no blurred vision and no chest pain. There are no hypoglycemic complications. Diabetic complications include nephropathy. Risk factors for coronary artery disease include diabetes mellitus, dyslipidemia, hypertension and post-menopausal. She is following a diabetic diet. She participates in exercise three times a week. Her breakfast blood glucose is taken between 7-8 am. Her breakfast blood glucose range is generally 90-110 mg/dl. An ACE inhibitor/angiotensin II receptor blocker is being taken. Eye exam is current.  Hypertension This is a chronic problem. The current episode started more than 1 year ago. The problem is unchanged. The problem is controlled. Pertinent negatives include no blurred vision, chest pain, palpitations or shortness of breath.     Past Medical History:  Diagnosis Date  . Diabetes mellitus   . High cholesterol   . Hypertension      Family History  Problem Relation Age of Onset  . Diabetes Mother   . Hypertension Mother   . Obesity Mother   . Diabetes Brother       Current Outpatient Medications:  .  BD PEN NEEDLE NANO U/F 32G X 4 MM MISC, USE WITH INSULIN DAILY AND OZEMPIC ONCE WEEKLY, Disp: 100 each, Rfl: 5 .  FREESTYLE LITE test strip, Use as directed to check blood sugars 3 times per day dx: E11.65, Disp: 150 each, Rfl: 3 .  gabapentin (NEURONTIN) 300 MG capsule, TAKE 1 CAPSULE BY MOUTH EVERY DAY, Disp: 90 capsule, Rfl: 2 .  glucose monitoring kit (FREESTYLE) monitoring kit, Use to check blood sugars 3 times daily. Dx code:e11.65, Disp: 1 each, Rfl: 3 .  Lancets (FREESTYLE) lancets, Use as directed to check blood sugars 3 times per day dx: E11.65, Disp: 150 each, Rfl: 3 .  lisinopril (ZESTRIL) 20 MG tablet, Take 1 tablet (20 mg total) by mouth daily., Disp: 90 tablet, Rfl: 2 .  OZEMPIC, 1 MG/DOSE, 2 MG/1.5ML SOPN, INJECT 1MG SUBCUTANEOUSLY EVERY WEEK ON THE SAME DAY OF EACH WEEK, IN THE ABDOMEN, THIGHS, OR UPPER ARM ROTATING INJECTION SITES, Disp: 6 pen, Rfl: 1 .  pravastatin (PRAVACHOL) 20 MG tablet, TAKE 1 TABLET BY MOUTH EVERY DAY, Disp: 90 tablet, Rfl: 1 .  SYNJARDY XR 04-999 MG TB24, TAKE 1 TABLET BY ORAL ROUTE EVERY DAY IN THE MORNING WITH A MEAL, Disp: 30 tablet, Rfl: 2 .  timolol (BETIMOL) 0.5 % ophthalmic solution, 1 drop 2 (two) times daily., Disp: , Rfl:  .  LEVEMIR FLEXTOUCH 100 UNIT/ML FlexPen, Inject 27 Units into the skin at bedtime., Disp: 15 pen, Rfl: 1   Allergies  Allergen Reactions  . Penicillins  Review of Systems  Constitutional: Negative.   Eyes: Negative for blurred vision.  Respiratory: Negative.  Negative for shortness of breath.   Cardiovascular: Negative.  Negative for chest pain and palpitations.  Gastrointestinal: Negative.   Neurological: Negative.   Psychiatric/Behavioral: Negative.      Today's Vitals   09/30/19 1008  BP: 132/84  Pulse: 87  Temp: 98 F (36.7 C)  TempSrc: Oral  SpO2: 94%  Weight: 161 lb 12.8 oz (73.4 kg)  Height: 5' (1.524 m)   Body mass index is 31.6 kg/m.   Objective:   Physical Exam Vitals and nursing note reviewed.  Constitutional:      Appearance: Normal appearance.  HENT:     Head: Normocephalic and atraumatic.  Cardiovascular:     Rate and Rhythm: Normal rate and regular rhythm.     Heart sounds: Normal heart sounds.  Pulmonary:     Effort: Pulmonary effort is normal.     Breath sounds: Normal breath sounds.  Skin:    General: Skin is warm.  Neurological:     General: No focal deficit present.     Mental Status: She is alert.  Psychiatric:        Mood and Affect: Mood normal.        Behavior: Behavior normal.         Assessment And Plan:     1. Diabetes mellitus with stage 1 chronic kidney disease ([facility])  Chronic, I will check labs as listed below. Importance of dietary compliance was discussed with the patient. She was advised to decrease Levemir to 27 units nightly. I will check renal function. I plan to increase her Synjardy to 25/1000mg once daily. If needed, I will increase her meds to BID dosing.  I will decrease her insulin further as her sugars dictate. All questions were answered to her satisfaction.   - Hemoglobin A1c - BMP8+EGFR  2. Hypertensive nephropathy  Chronic, fair control. She will continue with current meds. She is encouraged to exercise 30 minutes five days per weekly.   3. Class 1 obesity due to excess calories with serious comorbidity and body mass index (BMI) of 31.0 to 31.9 in adult  She is encouraged to exercise 150 minutes per week. Also advised to avoid sugary drinks and processed foods. I will reassess at her next visit in 3-4 weeks.   [name], MD    THE PATIENT IS ENCOURAGED TO PRACTICE SOCIAL DISTANCING DUE TO THE COVID-19 PANDEMIC.

## 2019-10-10 ENCOUNTER — Encounter: Payer: Self-pay | Admitting: Internal Medicine

## 2019-10-12 ENCOUNTER — Ambulatory Visit: Payer: Self-pay | Admitting: Internal Medicine

## 2019-10-18 ENCOUNTER — Telehealth: Payer: Self-pay

## 2019-10-18 NOTE — Telephone Encounter (Signed)
Called patient to advise her that her meds SYNJARDY XR 04-999 MG TB24 are ready for pick up

## 2019-10-20 ENCOUNTER — Telehealth: Payer: Self-pay

## 2019-10-20 NOTE — Telephone Encounter (Signed)
PT CONTACTED TO [facility] 05/13 NO ANS LVM TO CALL OFC

## 2019-11-01 ENCOUNTER — Ambulatory Visit: Payer: Self-pay | Admitting: Internal Medicine

## 2019-11-03 ENCOUNTER — Other Ambulatory Visit: Payer: Self-pay | Admitting: Internal Medicine

## 2019-11-18 ENCOUNTER — Ambulatory Visit: Payer: BC Managed Care – PPO | Admitting: Internal Medicine

## 2019-11-23 ENCOUNTER — Other Ambulatory Visit: Payer: Self-pay

## 2019-11-23 ENCOUNTER — Encounter: Payer: Self-pay | Admitting: Internal Medicine

## 2019-11-23 ENCOUNTER — Ambulatory Visit (INDEPENDENT_AMBULATORY_CARE_PROVIDER_SITE_OTHER): Payer: Medicare Other | Admitting: Internal Medicine

## 2019-11-23 VITALS — BP 126/74 | HR 81 | Temp 97.6°F | Ht 60.0 in | Wt 160.2 lb

## 2019-11-23 DIAGNOSIS — N181 Chronic kidney disease, stage 1: Secondary | ICD-10-CM

## 2019-11-23 DIAGNOSIS — Z79899 Other long term (current) drug therapy: Secondary | ICD-10-CM | POA: Diagnosis not present

## 2019-11-23 DIAGNOSIS — E1122 Type 2 diabetes mellitus with diabetic chronic kidney disease: Secondary | ICD-10-CM | POA: Diagnosis not present

## 2019-11-23 NOTE — Patient Instructions (Signed)

## 2019-11-23 NOTE — Progress Notes (Signed)
This visit occurred during the SARS-CoV-2 public health emergency.  Safety protocols were in place, including screening questions prior to the visit, additional usage of staff PPE, and extensive cleaning of exam room while observing appropriate contact time as indicated for disinfecting solutions.  Subjective:     Patient ID: Sharon Mcdonald , female    DOB: [birth year] , 64 y.o.   MRN: [id number]   Chief Complaint  Patient presents with  . Diabetes    HPI  She was started on Synjardy 25/1000mg once daily at her last visit. She has not had any issues with this medication. She reports her sugars are well controlled. She is also taking Ozempic and Levemir. She is hoping to eventually get off of insulin.     Past Medical History:  Diagnosis Date  . Diabetes mellitus   . High cholesterol   . Hypertension      Family History  Problem Relation Age of Onset  . Diabetes Mother   . Hypertension Mother   . Obesity Mother   . Diabetes Brother      Current Outpatient Medications:  .  BD PEN NEEDLE NANO U/F 32G X 4 MM MISC, USE WITH INSULIN DAILY AND OZEMPIC ONCE WEEKLY, Disp: 100 each, Rfl: 5 .  gabapentin (NEURONTIN) 300 MG capsule, TAKE 1 CAPSULE BY MOUTH EVERY DAY, Disp: 90 capsule, Rfl: 2 .  LEVEMIR FLEXTOUCH 100 UNIT/ML FlexPen, Inject 27 Units into the skin at bedtime., Disp: 15 pen, Rfl: 1 .  lisinopril (ZESTRIL) 20 MG tablet, TAKE 1 TABLET BY MOUTH EVERY DAY, Disp: 90 tablet, Rfl: 2 .  OZEMPIC, 1 MG/DOSE, 2 MG/1.5ML SOPN, INJECT 1MG SUBCUTANEOUSLY EVERY WEEK ON THE SAME DAY OF EACH WEEK, IN THE ABDOMEN, THIGHS, OR UPPER ARM ROTATING INJECTION SITES, Disp: 6 pen, Rfl: 1 .  pravastatin (PRAVACHOL) 20 MG tablet, TAKE 1 TABLET BY MOUTH EVERY DAY, Disp: 90 tablet, Rfl: 1 .  SYNJARDY XR 04-999 MG TB24, TAKE 1 TABLET BY ORAL ROUTE EVERY DAY IN THE MORNING WITH A MEAL, Disp: 30 tablet, Rfl: 2 .  timolol (BETIMOL) 0.5 % ophthalmic solution, 1 drop 2 (two) times daily., Disp: , Rfl:  .   glucose monitoring kit (FREESTYLE) monitoring kit, Use to check blood sugars 3 times daily. Dx code:e11.65 (Patient not taking: Reported on 11/23/2019), Disp: 1 each, Rfl: 3 .  Lancets (FREESTYLE) lancets, Use as directed to check blood sugars 3 times per day dx: E11.65 (Patient not taking: Reported on 11/23/2019), Disp: 150 each, Rfl: 3   Allergies  Allergen Reactions  . Penicillins      Review of Systems  Constitutional: Negative.   Respiratory: Negative.   Cardiovascular: Negative.   Gastrointestinal: Negative.   Neurological: Negative.   Psychiatric/Behavioral: Negative.      Today's Vitals   11/23/19 1411  BP: 126/74  Pulse: 81  Temp: 97.6 F (36.4 C)  TempSrc: Oral  Weight: 160 lb 3.2 oz (72.7 kg)  Height: 5' (1.524 m)  PainSc: 0-No pain   Body mass index is 31.29 kg/m.   BP Readings from Last 3 Encounters:  11/23/19 126/74  09/30/19 132/84  06/30/19 (!) 142/86   Objective:  Physical Exam Vitals and nursing note reviewed.  Constitutional:      Appearance: Normal appearance.  HENT:     Head: Normocephalic and atraumatic.  Cardiovascular:     Rate and Rhythm: Normal rate and regular rhythm.     Heart sounds: Normal heart sounds.  Pulmonary:  Effort: Pulmonary effort is normal.     Breath sounds: Normal breath sounds.  Skin:    General: Skin is warm.  Neurological:     General: No focal deficit present.     Mental Status: She is alert.  Psychiatric:        Mood and Affect: Mood normal.        Behavior: Behavior normal.         Assessment And Plan:     1. Diabetes mellitus with stage 1 chronic kidney disease (HCC)  Chronic. I will check renal function today since she has been taking SGLT2-inhibitor. She urgently wants to get off of insulin. Pt advised that we could consider changing Synjardy to 12.35m/1000mg twice daily dosing. She was advised to decrease Levemir to 27 units nightly. I will make further recommendations once her lab results have been  reviewed.   2. Polypharmacy  I will check Bmp today.   RMaximino Greenland MD    THE PATIENT IS ENCOURAGED TO PRACTICE SOCIAL DISTANCING DUE TO THE COVID-19 PANDEMIC.

## 2019-11-24 ENCOUNTER — Encounter: Payer: Self-pay | Admitting: Internal Medicine

## 2019-11-24 LAB — BMP8+EGFR
BUN/Creatinine Ratio: 17 (ref 12–28)
BUN: 15 mg/dL (ref 8–27)
CO2: 22 mmol/L (ref 20–29)
Calcium: 9.2 mg/dL (ref 8.7–10.3)
Chloride: 106 mmol/L (ref 96–106)
Creatinine, Ser: 0.87 mg/dL (ref 0.57–1.00)
GFR calc Af Amer: 81 mL/min/{1.73_m2} (ref >59)
GFR calc non Af Amer: 71 mL/min/{1.73_m2} (ref >59)
Glucose: 91 mg/dL (ref 65–99)
Potassium: 4.9 mmol/L (ref 3.5–5.2)
Sodium: 141 mmol/L (ref 134–144)

## 2019-11-25 ENCOUNTER — Encounter: Payer: Self-pay | Admitting: Internal Medicine

## 2019-12-01 ENCOUNTER — Other Ambulatory Visit: Payer: Self-pay

## 2019-12-01 MED ORDER — OZEMPIC (1 MG/DOSE) 2 MG/1.5ML ~~LOC~~ SOPN: PEN_INJECTOR | SUBCUTANEOUS | 1 refills | Status: DC

## 2019-12-20 ENCOUNTER — Encounter: Payer: Self-pay | Admitting: Internal Medicine

## 2020-01-04 ENCOUNTER — Encounter: Payer: Self-pay | Admitting: Internal Medicine

## 2020-01-04 ENCOUNTER — Ambulatory Visit (INDEPENDENT_AMBULATORY_CARE_PROVIDER_SITE_OTHER): Payer: BC Managed Care – PPO | Admitting: Internal Medicine

## 2020-01-04 ENCOUNTER — Other Ambulatory Visit: Payer: Self-pay

## 2020-01-04 VITALS — BP 114/82 | HR 84 | Temp 98.0°F | Ht 60.0 in | Wt 160.0 lb

## 2020-01-04 DIAGNOSIS — N181 Chronic kidney disease, stage 1: Secondary | ICD-10-CM

## 2020-01-04 DIAGNOSIS — E1122 Type 2 diabetes mellitus with diabetic chronic kidney disease: Secondary | ICD-10-CM | POA: Diagnosis not present

## 2020-01-04 DIAGNOSIS — I129 Hypertensive chronic kidney disease with stage 1 through stage 4 chronic kidney disease, or unspecified chronic kidney disease: Secondary | ICD-10-CM

## 2020-01-04 LAB — POCT UA - MICROALBUMIN
Albumin/Creatinine Ratio, Urine, POC: <30
Creatinine, POC: 100 mg/dL
Microalbumin Ur, POC: 30 mg/L

## 2020-01-04 MED ORDER — SYNJARDY XR 12.5-1000 MG PO TB24: 2.0000 | ORAL_TABLET | Freq: Every day | ORAL | 5 refills | Status: DC

## 2020-01-04 NOTE — Patient Instructions (Signed)
Diabetes Mellitus and Exercise Exercising regularly is important for your overall health, especially when you have diabetes (diabetes mellitus). Exercising is not only about losing weight. It has many other health benefits, such as increasing muscle strength and bone density and reducing body fat and stress. This leads to improved fitness, flexibility, and endurance, all of which result in better overall health. Exercise has additional benefits for people with diabetes, including:  Reducing appetite.  Helping to lower and control blood glucose.  Lowering blood pressure.  Helping to control amounts of fatty substances (lipids) in the blood, such as cholesterol and triglycerides.  Helping the body to respond better to insulin (improving insulin sensitivity).  Reducing how much insulin the body needs.  Decreasing the risk for heart disease by: ? Lowering cholesterol and triglyceride levels. ? Increasing the levels of good cholesterol. ? Lowering blood glucose levels. What is my activity plan? Your health care provider or certified diabetes educator can help you make a plan for the type and frequency of exercise (activity plan) that works for you. Make sure that you:  Do at least 150 minutes of moderate-intensity or vigorous-intensity exercise each week. This could be brisk walking, biking, or water aerobics. ? Do stretching and strength exercises, such as yoga or weightlifting, at least 2 times a week. ? Spread out your activity over at least 3 days of the week.  Get some form of physical activity every day. ? Do not go more than 2 days in a row without some kind of physical activity. ? Avoid being inactive for more than 30 minutes at a time. Take frequent breaks to walk or stretch.  Choose a type of exercise or activity that you enjoy, and set realistic goals.  Start slowly, and gradually increase the intensity of your exercise over time. What do I need to know about managing my  diabetes?   Check your blood glucose before and after exercising. ? If your blood glucose is 240 mg/dL (13.3 mmol/L) or higher before you exercise, check your urine for ketones. If you have ketones in your urine, do not exercise until your blood glucose returns to normal. ? If your blood glucose is 100 mg/dL (5.6 mmol/L) or lower, eat a snack containing 15-20 grams of carbohydrate. Check your blood glucose 15 minutes after the snack to make sure that your level is above 100 mg/dL (5.6 mmol/L) before you start your exercise.  Know the symptoms of low blood glucose (hypoglycemia) and how to treat it. Your risk for hypoglycemia increases during and after exercise. Common symptoms of hypoglycemia can include: ? Hunger. ? Anxiety. ? Sweating and feeling clammy. ? Confusion. ? Dizziness or feeling light-headed. ? Increased heart rate or palpitations. ? Blurry vision. ? Tingling or numbness around the mouth, lips, or tongue. ? Tremors or shakes. ? Irritability.  Keep a rapid-acting carbohydrate snack available before, during, and after exercise to help prevent or treat hypoglycemia.  Avoid injecting insulin into areas of the body that are going to be exercised. For example, avoid injecting insulin into: ? The arms, when playing tennis. ? The legs, when jogging.  Keep records of your exercise habits. Doing this can help you and your health care provider adjust your diabetes management plan as needed. Write down: ? Food that you eat before and after you exercise. ? Blood glucose levels before and after you exercise. ? The type and amount of exercise you have done. ? When your insulin is expected to peak, if you use   insulin. Avoid exercising at times when your insulin is peaking.  When you start a new exercise or activity, work with your health care provider to make sure the activity is safe for you, and to adjust your insulin, medicines, or food intake as needed.  Drink plenty of water while  you exercise to prevent dehydration or heat stroke. Drink enough fluid to keep your urine clear or pale yellow. Summary  Exercising regularly is important for your overall health, especially when you have diabetes (diabetes mellitus).  Exercising has many health benefits, such as increasing muscle strength and bone density and reducing body fat and stress.  Your health care provider or certified diabetes educator can help you make a plan for the type and frequency of exercise (activity plan) that works for you.  When you start a new exercise or activity, work with your health care provider to make sure the activity is safe for you, and to adjust your insulin, medicines, or food intake as needed. This information is not intended to replace advice given to you by your health care provider. Make sure you discuss any questions you have with your health care provider. Document Revised: 01/16/2017 Document Reviewed: 12/04/2015 Elsevier Patient Education  2020 [employer]  

## 2020-01-04 NOTE — Progress Notes (Signed)
This visit occurred during the SARS-CoV-2 public health emergency.  Safety protocols were in place, including screening questions prior to the visit, additional usage of staff PPE, and extensive cleaning of exam room while observing appropriate contact time as indicated for disinfecting solutions.  Subjective:     Patient ID: Sharon Mcdonald , female    DOB: May 20, 1956 , 64 y.o.   MRN: [id number]   Chief Complaint  Patient presents with  . Diabetes  . Hypertension    HPI  She presents today for DM/HTN check. She reports compliance with meds. She has no specific concerns or complaints at this time.   Diabetes She presents for her follow-up diabetic visit. She has type 2 diabetes mellitus. Her disease course has been stable. There are no hypoglycemic associated symptoms. Pertinent negatives for diabetes include no blurred vision and no chest pain. There are no hypoglycemic complications. Diabetic complications include nephropathy. Risk factors for coronary artery disease include diabetes mellitus, dyslipidemia, hypertension and post-menopausal. She is following a diabetic diet. She participates in exercise three times a week. Her breakfast blood glucose is taken between 7-8 am. Her breakfast blood glucose range is generally 90-110 mg/dl. An ACE inhibitor/angiotensin II receptor blocker is being taken. Eye exam is current.  Hypertension This is a chronic problem. The current episode started more than 1 year ago. The problem is unchanged. The problem is controlled. Pertinent negatives include no blurred vision, chest pain, palpitations or shortness of breath.     Past Medical History:  Diagnosis Date  . Diabetes mellitus   . High cholesterol   . Hypertension      Family History  Problem Relation Age of Onset  . Diabetes Mother   . Hypertension Mother   . Obesity Mother   . Diabetes Brother      Current Outpatient Medications:  .  BD PEN NEEDLE NANO U/F 32G X 4 MM MISC, USE WITH  INSULIN DAILY AND OZEMPIC ONCE WEEKLY, Disp: 100 each, Rfl: 5 .  gabapentin (NEURONTIN) 300 MG capsule, TAKE 1 CAPSULE BY MOUTH EVERY DAY, Disp: 90 capsule, Rfl: 2 .  glucose monitoring kit (FREESTYLE) monitoring kit, Use to check blood sugars 3 times daily. Dx code:e11.65, Disp: 1 each, Rfl: 3 .  Lancets (FREESTYLE) lancets, Use as directed to check blood sugars 3 times per day dx: E11.65, Disp: 150 each, Rfl: 3 .  LEVEMIR FLEXTOUCH 100 UNIT/ML FlexPen, Inject 27 Units into the skin at bedtime. (Patient taking differently: Inject 24 Units into the skin at bedtime. ), Disp: 15 pen, Rfl: 1 .  lisinopril (ZESTRIL) 20 MG tablet, TAKE 1 TABLET BY MOUTH EVERY DAY, Disp: 90 tablet, Rfl: 2 .  pravastatin (PRAVACHOL) 20 MG tablet, TAKE 1 TABLET BY MOUTH EVERY DAY, Disp: 90 tablet, Rfl: 1 .  Semaglutide, 1 MG/DOSE, (OZEMPIC, 1 MG/DOSE,) 2 MG/1.5ML SOPN, INJECT 1MG SUBCUTANEOUSLY EVERY WEEK ON THE SAME DAY OF EACH WEEK, IN THE ABDOMEN, THIGHS, OR UPPER ARM ROTATING INJECTION SITES, Disp: 9 pen, Rfl: 1 .  timolol (BETIMOL) 0.5 % ophthalmic solution, 1 drop 2 (two) times daily., Disp: , Rfl:  .  Empagliflozin-metFORMIN HCl ER (SYNJARDY XR) 12.11-998 MG TB24, Take 2 tablets by mouth daily., Disp: 60 tablet, Rfl: 5   No Known Allergies   Review of Systems  Constitutional: Negative.   Eyes: Negative for blurred vision.  Respiratory: Negative.  Negative for shortness of breath.   Cardiovascular: Negative.  Negative for chest pain and palpitations.  Gastrointestinal: Negative.   Neurological:  Negative.   Psychiatric/Behavioral: Negative.      Today's Vitals   01/04/20 0957  BP: 114/82  Pulse: 84  Temp: 98 F (36.7 C)  TempSrc: Oral  Weight: 160 lb (72.6 kg)  Height: 5' (1.524 m)   Body mass index is 31.25 kg/m.   Objective:  Physical Exam Vitals and nursing note reviewed.  Constitutional:      Appearance: Normal appearance.  HENT:     Head: Normocephalic and atraumatic.  Cardiovascular:      Rate and Rhythm: Normal rate and regular rhythm.     Heart sounds: Normal heart sounds.  Pulmonary:     Effort: Pulmonary effort is normal.     Breath sounds: Normal breath sounds.  Skin:    General: Skin is warm.  Neurological:     General: No focal deficit present.     Mental Status: She is alert.  Psychiatric:        Mood and Affect: Mood normal.        Behavior: Behavior normal.         Assessment And Plan:     1. Diabetes mellitus with stage 1 chronic kidney disease (HCC)  Chronic. I will further decrease Levemir to 20 units nightly. I will continue with Synjardy 12.15m/1000 2 tabs daily. Pt wants to get off of insulin, think she will need intensified oral therapy in order to get off of insulin. She was congratulated on her lifestyle changes thus far, encouraged to resume her exercise program. We also discussed CKD, in the past she has had pos microalbuminuria. I will recheck urine microalbumin today. She will rto in 3 months for re-evaluation.   - Hemoglobin A1c - POCT UA - Microalbumin  2. Hypertensive nephropathy  Chronic, well controlled. She will continue with current meds for now.   I personally spent 30 minutes face-to-face and non-face-to-face in the care of this patient, which includes all pre-, intra-, and post visit time on the date of service.   RMaximino Greenland MD    THE PATIENT IS ENCOURAGED TO PRACTICE SOCIAL DISTANCING DUE TO THE COVID-19 PANDEMIC.

## 2020-01-05 ENCOUNTER — Encounter: Payer: Self-pay | Admitting: Internal Medicine

## 2020-01-05 LAB — HEMOGLOBIN A1C
Est. average glucose Bld gHb Est-mCnc: 134 mg/dL
Hgb A1c MFr Bld: 6.3 % — ABNORMAL HIGH (ref 4.8–5.6)

## 2020-01-13 ENCOUNTER — Encounter: Payer: Self-pay | Admitting: Internal Medicine

## 2020-02-07 ENCOUNTER — Other Ambulatory Visit: Payer: Self-pay | Admitting: Internal Medicine

## 2020-04-11 ENCOUNTER — Other Ambulatory Visit: Payer: Self-pay

## 2020-04-11 ENCOUNTER — Encounter: Payer: Self-pay | Admitting: Internal Medicine

## 2020-04-11 ENCOUNTER — Ambulatory Visit (INDEPENDENT_AMBULATORY_CARE_PROVIDER_SITE_OTHER): Payer: BC Managed Care – PPO | Admitting: Internal Medicine

## 2020-04-11 VITALS — BP 118/76 | HR 85 | Temp 98.3°F | Ht 60.0 in | Wt 157.2 lb

## 2020-04-11 DIAGNOSIS — I10 Essential (primary) hypertension: Secondary | ICD-10-CM | POA: Diagnosis not present

## 2020-04-11 DIAGNOSIS — E6609 Other obesity due to excess calories: Secondary | ICD-10-CM | POA: Diagnosis not present

## 2020-04-11 DIAGNOSIS — E1169 Type 2 diabetes mellitus with other specified complication: Secondary | ICD-10-CM

## 2020-04-11 DIAGNOSIS — Z6830 Body mass index (BMI) 30.0-30.9, adult: Secondary | ICD-10-CM

## 2020-04-11 DIAGNOSIS — E785 Hyperlipidemia, unspecified: Secondary | ICD-10-CM

## 2020-04-11 NOTE — Progress Notes (Signed)
I,[name],acting as a Education administrator for [name], MD.,have documented all relevant documentation on the behalf of [name], MD,as directed by  [name], MD while in the presence of [name], MD.  This visit occurred during the SARS-CoV-2 public health emergency.  Safety protocols were in place, including screening questions prior to the visit, additional usage of staff PPE, and extensive cleaning of exam room while observing appropriate contact time as indicated for disinfecting solutions.  Subjective:     Patient ID: Sharon Mcdonald , female    DOB: 1956-02-08 , 64 y.o.   MRN: [id number]   Chief Complaint  Patient presents with  . Diabetes  . Hypertension    HPI  She presents today for DM/HTN check. She reports compliance with meds. She retired July 2021. Her dose of Levemir was decreased after her last visit to 20 units nightly. Her sugars have remained stable. She feels well on her current regimen.   Diabetes She presents for her follow-up diabetic visit. She has type 2 diabetes mellitus. Her disease course has been stable. There are no hypoglycemic associated symptoms. Pertinent negatives for diabetes include no blurred vision and no chest pain. There are no hypoglycemic complications. Diabetic complications include nephropathy. Risk factors for coronary artery disease include diabetes mellitus, dyslipidemia, hypertension and post-menopausal. She is following a diabetic diet. She participates in exercise three times a week. Her breakfast blood glucose is taken between 7-8 am. Her breakfast blood glucose range is generally 90-110 mg/dl. An ACE inhibitor/angiotensin II receptor blocker is being taken. Eye exam is current.  Hypertension This is a chronic problem. The current episode started more than 1 year ago. The problem is unchanged. The problem is controlled. Pertinent negatives include no blurred vision, chest pain, palpitations or shortness of breath.      Past Medical History:  Diagnosis Date  . Diabetes mellitus   . High cholesterol   . Hypertension      Family History  Problem Relation Age of Onset  . Diabetes Mother   . Hypertension Mother   . Obesity Mother   . Diabetes Brother      Current Outpatient Medications:  .  BD PEN NEEDLE NANO U/F 32G X 4 MM MISC, USE WITH INSULIN DAILY AND OZEMPIC ONCE WEEKLY, Disp: 100 each, Rfl: 5 .  Empagliflozin-metFORMIN HCl ER (SYNJARDY XR) 12.11-998 MG TB24, Take 2 tablets by mouth daily., Disp: 60 tablet, Rfl: 5 .  gabapentin (NEURONTIN) 300 MG capsule, TAKE 1 CAPSULE BY MOUTH EVERY DAY, Disp: 90 capsule, Rfl: 2 .  LEVEMIR FLEXTOUCH 100 UNIT/ML FlexPen, Inject 27 Units into the skin at bedtime. (Patient taking differently: Inject 20 Units into the skin at bedtime. ), Disp: 15 pen, Rfl: 1 .  lisinopril (ZESTRIL) 20 MG tablet, TAKE 1 TABLET BY MOUTH EVERY DAY, Disp: 90 tablet, Rfl: 2 .  pravastatin (PRAVACHOL) 20 MG tablet, TAKE 1 TABLET BY MOUTH EVERY DAY, Disp: 90 tablet, Rfl: 2 .  Semaglutide, 1 MG/DOSE, (OZEMPIC, 1 MG/DOSE,) 2 MG/1.5ML SOPN, INJECT 1MG SUBCUTANEOUSLY EVERY WEEK ON THE SAME DAY OF EACH WEEK, IN THE ABDOMEN, THIGHS, OR UPPER ARM ROTATING INJECTION SITES, Disp: 9 pen, Rfl: 1 .  timolol (BETIMOL) 0.5 % ophthalmic solution, 1 drop 2 (two) times daily., Disp: , Rfl:  .  dorzolamide-timolol (COSOPT) 22.3-6.8 MG/ML ophthalmic solution, 1 drop 2 (two) times daily., Disp: , Rfl:  .  glucose monitoring kit (FREESTYLE) monitoring kit, Use to check blood sugars 3 times  daily. Dx code:e11.65, Disp: 1 each, Rfl: 3 .  Lancets (FREESTYLE) lancets, Use as directed to check blood sugars 3 times per day dx: E11.65, Disp: 150 each, Rfl: 3   No Known Allergies   Review of Systems  Constitutional: Negative.   Eyes: Negative for blurred vision.  Respiratory: Negative.  Negative for shortness of breath.   Cardiovascular: Negative.  Negative for chest pain and palpitations.  Gastrointestinal:  Negative.   Psychiatric/Behavioral: Negative.   All other systems reviewed and are negative.    Today's Vitals   04/11/20 1410  BP: 118/76  Pulse: 85  Temp: 98.3 F (36.8 C)  TempSrc: Oral  Weight: 157 lb 3.2 oz (71.3 kg)  Height: 5' (1.524 m)   Body mass index is 30.7 kg/m.  Wt Readings from Last 3 Encounters:  04/11/20 157 lb 3.2 oz (71.3 kg)  01/04/20 160 lb (72.6 kg)  11/23/19 160 lb 3.2 oz (72.7 kg)   Objective:  Physical Exam Vitals and nursing note reviewed.  Constitutional:      Appearance: Normal appearance.  HENT:     Head: Normocephalic and atraumatic.  Cardiovascular:     Rate and Rhythm: Normal rate and regular rhythm.     Heart sounds: Normal heart sounds.  Pulmonary:     Breath sounds: Normal breath sounds.  Skin:    General: Skin is warm.  Neurological:     General: No focal deficit present.     Mental Status: She is alert and oriented to person, place, and time.         Assessment And Plan:     1. Dyslipidemia associated with type 2 diabetes mellitus ([place]) Comments: Chronic, I will check labs as listed below. I will adjust meds as needed. Our plan is to eventually wean her off of insulin. She will c/w chol meds.  - Hgb A1c w/o eAG - BMP8+EGFR  2. Essential hypertension, benign Comments: Chronic, well controlled. She will continue with current meds.   3. Class 1 obesity due to excess calories with serious comorbidity and body mass index (BMI) of 30.0 to 30.9 in adult Comments: She has lost another 3 pounds since her last visit. She is encouraged to aim for at least 150 minutes of exercise per week.   Patient was given opportunity to ask questions. Patient verbalized understanding of the plan and was able to repeat key elements of the plan. All questions were answered to their satisfaction.  [name], MD   I, [name], MD, have reviewed all documentation for this visit. The documentation on 04/15/20 for the exam, diagnosis,  procedures, and orders are all accurate and complete.  THE PATIENT IS ENCOURAGED TO PRACTICE SOCIAL DISTANCING DUE TO THE COVID-19 PANDEMIC.

## 2020-04-11 NOTE — Patient Instructions (Signed)
Diabetes Mellitus and Foot Care Foot care is an important part of your health, especially when you have diabetes. Diabetes may cause you to have problems because of poor blood flow (circulation) to your feet and legs, which can cause your skin to:  Become thinner and drier.  Break more easily.  Heal more slowly.  Peel and crack. You may also have nerve damage (neuropathy) in your legs and feet, causing decreased feeling in them. This means that you may not notice minor injuries to your feet that could lead to more serious problems. Noticing and addressing any potential problems early is the best way to prevent future foot problems. How to care for your feet Foot hygiene  Wash your feet daily with warm water and mild soap. Do not use hot water. Then, pat your feet and the areas between your toes until they are completely dry. Do not soak your feet as this can dry your skin.  Trim your toenails straight across. Do not dig under them or around the cuticle. File the edges of your nails with an emery board or nail file.  Apply a moisturizing lotion or petroleum jelly to the skin on your feet and to dry, brittle toenails. Use lotion that does not contain alcohol and is unscented. Do not apply lotion between your toes. Shoes and socks  Wear clean socks or stockings every day. Make sure they are not too tight. Do not wear knee-high stockings since they may decrease blood flow to your legs.  Wear shoes that fit properly and have enough cushioning. Always look in your shoes before you put them on to be sure there are no objects inside.  To break in new shoes, wear them for just a few hours a day. This prevents injuries on your feet. Wounds, scrapes, corns, and calluses  Check your feet daily for blisters, cuts, bruises, sores, and redness. If you cannot see the bottom of your feet, use a mirror or ask someone for help.  Do not cut corns or calluses or try to remove them with medicine.  If you  find a minor scrape, cut, or break in the skin on your feet, keep it and the skin around it clean and dry. You may clean these areas with mild soap and water. Do not clean the area with peroxide, alcohol, or iodine.  If you have a wound, scrape, corn, or callus on your foot, look at it several times a day to make sure it is healing and not infected. Check for: ? Redness, swelling, or pain. ? Fluid or blood. ? Warmth. ? Pus or a bad smell. General instructions  Do not cross your legs. This may decrease blood flow to your feet.  Do not use heating pads or hot water bottles on your feet. They may burn your skin. If you have lost feeling in your feet or legs, you may not know this is happening until it is too late.  Protect your feet from hot and cold by wearing shoes, such as at the beach or on hot pavement.  Schedule a complete foot exam at least once a year (annually) or more often if you have foot problems. If you have foot problems, report any cuts, sores, or bruises to your health care provider immediately. Contact a health care provider if:  You have a medical condition that increases your risk of infection and you have any cuts, sores, or bruises on your feet.  You have an injury that is not   healing.  You have redness on your legs or feet.  You feel burning or tingling in your legs or feet.  You have pain or cramps in your legs and feet.  Your legs or feet are numb.  Your feet always feel cold.  You have pain around a toenail. Get help right away if:  You have a wound, scrape, corn, or callus on your foot and: ? You have pain, swelling, or redness that gets worse. ? You have fluid or blood coming from the wound, scrape, corn, or callus. ? Your wound, scrape, corn, or callus feels warm to the touch. ? You have pus or a bad smell coming from the wound, scrape, corn, or callus. ? You have a fever. ? You have a red line going up your leg. Summary  Check your feet every day  for cuts, sores, red spots, swelling, and blisters.  Moisturize feet and legs daily.  Wear shoes that fit properly and have enough cushioning.  If you have foot problems, report any cuts, sores, or bruises to your health care provider immediately.  Schedule a complete foot exam at least once a year (annually) or more often if you have foot problems. This information is not intended to replace advice given to you by your health care provider. Make sure you discuss any questions you have with your health care provider. Document Revised: 03/17/2019 Document Reviewed: 07/26/2016 Elsevier Patient Education  2020 [employer]  

## 2020-04-12 ENCOUNTER — Encounter: Payer: Self-pay | Admitting: Internal Medicine

## 2020-04-12 LAB — BMP8+EGFR
BUN/Creatinine Ratio: 23 (ref 12–28)
BUN: 18 mg/dL (ref 8–27)
CO2: 24 mmol/L (ref 20–29)
Calcium: 9.6 mg/dL (ref 8.7–10.3)
Chloride: 103 mmol/L (ref 96–106)
Creatinine, Ser: 0.78 mg/dL (ref 0.57–1.00)
GFR calc Af Amer: 93 mL/min/{1.73_m2} (ref >59)
GFR calc non Af Amer: 81 mL/min/{1.73_m2} (ref >59)
Glucose: 94 mg/dL (ref 65–99)
Potassium: 4.6 mmol/L (ref 3.5–5.2)
Sodium: 141 mmol/L (ref 134–144)

## 2020-04-12 LAB — HGB A1C W/O EAG: Hgb A1c MFr Bld: 7.1 % — ABNORMAL HIGH (ref 4.8–5.6)

## 2020-05-18 ENCOUNTER — Other Ambulatory Visit: Payer: Self-pay | Admitting: Internal Medicine

## 2020-05-18 DIAGNOSIS — Z1231 Encounter for screening mammogram for malignant neoplasm of breast: Secondary | ICD-10-CM

## 2020-06-14 ENCOUNTER — Other Ambulatory Visit: Payer: Self-pay | Admitting: Internal Medicine

## 2020-06-14 NOTE — Telephone Encounter (Signed)
Gabapentin refill

## 2020-06-28 ENCOUNTER — Other Ambulatory Visit: Payer: Self-pay

## 2020-06-28 ENCOUNTER — Ambulatory Visit
Admission: RE | Admit: 2020-06-28 | Discharge: 2020-06-28 | Disposition: A | Discharge status: Home / Self Care | Payer: 59 | Source: Ambulatory Visit | Attending: Internal Medicine | Admitting: Internal Medicine

## 2020-06-28 DIAGNOSIS — Z1231 Encounter for screening mammogram for malignant neoplasm of breast: Secondary | ICD-10-CM

## 2020-07-17 ENCOUNTER — Encounter: Payer: Self-pay | Admitting: Internal Medicine

## 2020-07-27 ENCOUNTER — Other Ambulatory Visit: Payer: Self-pay

## 2020-07-27 ENCOUNTER — Ambulatory Visit (INDEPENDENT_AMBULATORY_CARE_PROVIDER_SITE_OTHER): Payer: Medicare PPO | Admitting: Internal Medicine

## 2020-07-27 ENCOUNTER — Encounter: Payer: Self-pay | Admitting: Internal Medicine

## 2020-07-27 VITALS — BP 112/74 | HR 92 | Temp 97.8°F | Ht 60.6 in | Wt 156.4 lb

## 2020-07-27 DIAGNOSIS — Z79899 Other long term (current) drug therapy: Secondary | ICD-10-CM

## 2020-07-27 DIAGNOSIS — N390 Urinary tract infection, site not specified: Secondary | ICD-10-CM

## 2020-07-27 DIAGNOSIS — H6121 Impacted cerumen, right ear: Secondary | ICD-10-CM | POA: Diagnosis not present

## 2020-07-27 DIAGNOSIS — E6609 Other obesity due to excess calories: Secondary | ICD-10-CM

## 2020-07-27 DIAGNOSIS — I129 Hypertensive chronic kidney disease with stage 1 through stage 4 chronic kidney disease, or unspecified chronic kidney disease: Secondary | ICD-10-CM | POA: Diagnosis not present

## 2020-07-27 DIAGNOSIS — N181 Chronic kidney disease, stage 1: Secondary | ICD-10-CM | POA: Diagnosis not present

## 2020-07-27 DIAGNOSIS — Z0000 Encounter for general adult medical examination without abnormal findings: Secondary | ICD-10-CM | POA: Diagnosis not present

## 2020-07-27 DIAGNOSIS — E1122 Type 2 diabetes mellitus with diabetic chronic kidney disease: Secondary | ICD-10-CM | POA: Diagnosis not present

## 2020-07-27 DIAGNOSIS — I10 Essential (primary) hypertension: Secondary | ICD-10-CM

## 2020-07-27 DIAGNOSIS — Z6830 Body mass index (BMI) 30.0-30.9, adult: Secondary | ICD-10-CM

## 2020-07-27 LAB — POCT URINALYSIS DIPSTICK
Bilirubin, UA: NEGATIVE
Glucose, UA: POSITIVE — AB
Ketones, UA: NEGATIVE
Nitrite, UA: POSITIVE
Protein, UA: NEGATIVE
Spec Grav, UA: 1.02 (ref 1.010–1.025)
Urobilinogen, UA: 0.2 E.U./dL
pH, UA: 5 (ref 5.0–8.0)

## 2020-07-27 LAB — POCT UA - MICROALBUMIN
Albumin/Creatinine Ratio, Urine, POC: <30
Creatinine, POC: 100 mg/dL
Microalbumin Ur, POC: 30 mg/L

## 2020-07-27 NOTE — Patient Instructions (Addendum)
Vitamin C 500mg  daily  Sharon Mcdonald , Thank you for taking time to come for your Medicare Wellness Visit. I appreciate your ongoing commitment to your health goals. Please review the following plan we discussed and let me know if I can assist you in the future.   These are the goals we discussed: Goals    . Patient Stated     "I would like to get off of these meds"       This is a list of the screening recommended for you and due dates:  Health Maintenance  Topic Date Due  . HIV Screening  Never done  . DEXA scan (bone density measurement)  Never done  . Complete foot exam   06/29/2020  . Pneumonia vaccines (1 of 2 - PCV13) 07/19/2020  . Flu Shot  10/05/2020*  . Eye exam for diabetics  08/01/2020  . Hemoglobin A1C  10/10/2020  . Mammogram  06/28/2022  . Colon Cancer Screening  01/10/2025  . Tetanus Vaccine  06/04/2027  . COVID-19 Vaccine  Completed  .  Hepatitis C: One time screening is recommended by Center for Disease Control  (CDC) for  adults born from [birth year] through 1965.   Completed  . Pap Smear  Discontinued  *Topic was postponed. The date shown is not the original due date.

## 2020-07-27 NOTE — Progress Notes (Unsigned)
I,[name],acting as a Education administrator for [name], MD.,have documented all relevant documentation on the behalf of [name], MD,as directed by  [name], MD while in the presence of [name], MD. bre   Subjective:    Sharon Mcdonald is a 65 y.o. female who presents for a Welcome to Medicare exam. She is aware that this includes a physical exam. She reports compliance with meds. She is followed by GYN for her pelvic exams.   Diabetes She presents for her follow-up diabetic visit. She has type 2 diabetes mellitus. Her disease course has been stable. There are no hypoglycemic associated symptoms. Pertinent negatives for diabetes include no blurred vision and no chest pain. There are no hypoglycemic complications. Diabetic complications include nephropathy. Risk factors for coronary artery disease include diabetes mellitus, dyslipidemia, hypertension and post-menopausal. She is following a diabetic diet. She participates in exercise three times a week. Her breakfast blood glucose is taken between 7-8 am. Her breakfast blood glucose range is generally 90-110 mg/dl. An ACE inhibitor/angiotensin II receptor blocker is being taken. Eye exam is current.  Hypertension This is a chronic problem. The current episode started more than 1 year ago. The problem is unchanged. The problem is controlled. Pertinent negatives include no blurred vision, chest pain, palpitations or shortness of breath.     Review of Systems Review of Systems  Constitutional: Negative.   HENT: Negative.   Eyes: Negative.  Negative for blurred vision.  Respiratory: Negative.  Negative for shortness of breath.   Cardiovascular: Negative.  Negative for chest pain and palpitations.  Gastrointestinal: Negative.   Genitourinary: Negative.   Musculoskeletal: Negative.   Skin: Negative.   Neurological: Negative.   Endo/Heme/Allergies: Negative.   Psychiatric/Behavioral: Negative.    Cardiac Risk Factors  include: diabetes mellitus;dyslipidemia;advanced age (>18mn, >>50women);hypertension;sedentary lifestyle      Objective:    Today's Vitals   07/27/20 1426 07/27/20 1458  BP: 112/74   Pulse: 92   Temp: 97.8 F (36.6 C)   TempSrc: Oral   Weight: 156 lb 6.4 oz (70.9 kg)   Height: 5' 0.6" (1.539 m)   PainSc: 0-No pain 0-No pain  Body mass index is 29.94 kg/m. Wt Readings from Last 3 Encounters:  07/27/20 156 lb 6.4 oz (70.9 kg)  04/11/20 157 lb 3.2 oz (71.3 kg)  01/04/20 160 lb (72.6 kg)   Medications Outpatient Encounter Medications as of 07/27/2020  Medication Sig  . BD PEN NEEDLE NANO U/F 32G X 4 MM MISC USE WITH INSULIN DAILY AND OZEMPIC ONCE WEEKLY  . dorzolamide-timolol (COSOPT) 22.3-6.8 MG/ML ophthalmic solution 1 drop 2 (two) times daily.  . Empagliflozin-metFORMIN HCl ER (SYNJARDY XR) 12.11-998 MG TB24 Take 2 tablets by mouth daily.  .Marland Kitchengabapentin (NEURONTIN) 300 MG capsule TAKE 1 CAPSULE BY MOUTH EVERY DAY  . glucose monitoring kit (FREESTYLE) monitoring kit Use to check blood sugars 3 times daily. Dx code:e11.65  . Lancets (FREESTYLE) lancets Use as directed to check blood sugars 3 times per day dx: E11.65  . LEVEMIR FLEXTOUCH 100 UNIT/ML FlexPen INJECT 27 UNITS INTO THE SKIN AT BEDTIME, max titration dose 60 units (Patient taking differently: 20 Units. INJECT 27 UNITS INTO THE SKIN AT BEDTIME, max titration dose 60 units)  . lisinopril (ZESTRIL) 20 MG tablet TAKE 1 TABLET BY MOUTH EVERY DAY  . pravastatin (PRAVACHOL) 20 MG tablet TAKE 1 TABLET BY MOUTH EVERY DAY  . Semaglutide, 1 MG/DOSE, (OZEMPIC, 1 MG/DOSE,) 2 MG/1.5ML SOPN INJECT 1MG  SUBCUTANEOUSLY EVERY WEEK ON THE SAME DAY OF EACH WEEK, IN THE ABDOMEN, THIGHS, OR UPPER ARM ROTATING INJECTION SITES  . [DISCONTINUED] timolol (BETIMOL) 0.5 % ophthalmic solution 1 drop 2 (two) times daily.   No facility-administered encounter medications on file as of 07/27/2020.     History: Past Medical History:  Diagnosis Date  .  Diabetes mellitus   . High cholesterol   . Hypertension    Past Surgical History:  Procedure Laterality Date  . BREAST CYST EXCISION Left   . BREAST SURGERY    . REDUCTION MAMMAPLASTY Bilateral   . TONSILLECTOMY    . TUBAL LIGATION      Family History  Problem Relation Age of Onset  . Diabetes Mother   . Hypertension Mother   . Obesity Mother   . Diabetes Brother    Social History   Occupational History  . Not on file  Tobacco Use  . Smoking status: Former Smoker    Types: Cigarettes    Quit date: 09/07/1978    Years since quitting: 41.9  . Smokeless tobacco: Never Used  . Tobacco comment: 1/2 pack per week  Vaping Use  . Vaping Use: Never used  Substance and Sexual Activity  . Alcohol use: No  . Drug use: No  . Sexual activity: Yes    Birth control/protection: Surgical    Tobacco Counseling Counseling given: Not Answered Comment: 1/2 pack per week   Immunizations and Health Maintenance Immunization History  Administered Date(s) Administered  . DTaP 06/03/2017  . PFIZER(Purple Top)SARS-COV-2 Vaccination 09/17/2019, 10/12/2019, 04/24/2020   Health Maintenance Due  Topic Date Due  . HIV Screening  Never done  . FOOT EXAM  06/29/2020  . DEXA SCAN  Never done  . PNA vac Low Risk Adult (1 of 2 - PCV13) 07/19/2020    Activities of Daily Living In your present state of health, do you have any difficulty performing the following activities: 07/27/2020 04/11/2020  Hearing? N N  Vision? N N  Difficulty concentrating or making decisions? N N  Walking or climbing stairs? N N  Dressing or bathing? N N  Doing errands, shopping? N N  Preparing Food and eating ? N -  Using the Toilet? N -  In the past six months, have you accidently leaked urine? N -  Do you have problems with loss of bowel control? N -  Managing your Medications? N -  Managing your Finances? N -  Housekeeping or managing your Housekeeping? N -  Some recent data might be hidden    Physical Exam    Physical Exam Vitals and nursing note reviewed.  Constitutional:      Appearance: Normal appearance.  HENT:     Head: Normocephalic and atraumatic.     Right Ear: Ear canal and external ear normal. There is impacted cerumen.     Left Ear: Tympanic membrane, ear canal and external ear normal.     Nose:     Comments: Deferred, masked    Mouth/Throat:     Comments: Deferred, masked Eyes:     Extraocular Movements: Extraocular movements intact.     Conjunctiva/sclera: Conjunctivae normal.     Pupils: Pupils are equal, round, and reactive to light.  Cardiovascular:     Rate and Rhythm: Normal rate and regular rhythm.  Pulmonary:     Effort: Pulmonary effort is normal.  Chest:  Breasts:     Tanner Score is 5.     Right: Normal.  Left: Normal.    Abdominal:     General: Bowel sounds are normal.     Palpations: Abdomen is soft.  Genitourinary:    Comments: Deferred. Musculoskeletal:        General: Normal range of motion.     Cervical back: Normal range of motion.  Skin:    General: Skin is warm.  Neurological:     General: No focal deficit present.     Mental Status: She is alert and oriented to person, place, and time.  Psychiatric:        Mood and Affect: Mood and affect normal.        Behavior: Behavior normal.   (optional), or other factors deemed appropriate based on the beneficiary's medical and social history and current clinical standards.  Advanced Directives: Does Patient Have a Medical Advance Directive?: Yes Type of Advance Directive: [place] in Chart?: No - copy requested    Assessment:    This is a routine wellness examination for this patient .   Vision/Hearing screen  Hearing Screening   '125Hz'  '250Hz'  '500Hz'  '1000Hz'  '2000Hz'  '3000Hz'  '4000Hz'  '6000Hz'  '8000Hz'   Right ear:  '25 25 25 25  25    ' Left ear:  '25 25 25 25  25      ' Visual Acuity Screening   Right eye Left eye Both eyes  Without  correction: '20/30 20/30 20/30 '  With correction: '20/30 20/30 20/30 '    Dietary issues and exercise activities discussed:  Current Exercise Habits: Home exercise routine, Type of exercise: walking, Time (Minutes): 30, Frequency (Times/Week): 2, Weekly Exercise (Minutes/Week): 60, Intensity: Moderate  Goals    . Patient Stated     "I would like to get off of these meds"      Depression Screen PHQ 2/9 Scores 07/27/2020 01/03/2020 04/01/2019 10/27/2018  PHQ - 2 Score 0 0 0 0     Fall Risk Fall Risk  07/27/2020  Falls in the past year? 0  Number falls in past yr: -  Injury with Fall? -    Cognitive Function:     6CIT Screen 07/27/2020  What Year? 0 points  What month? 0 points  What time? 0 points  Count back from 20 0 points  Months in reverse 0 points  Repeat phrase 0 points  Total Score 0    Patient Care Team: Glendale Chard, MD as PCP - General (Internal Medicine)     Plan:    1. Initial Medicare annual wellness visit The annual wellness visit was performed including discussion of advanced directives, assessment of functional status and cognitive function. A full exam was performed. Importance of monthly self breast exams was discussed with the patient. PATIENT IS ADVISED TO GET 30-45 MINUTES REGULAR EXERCISE NO LESS THAN FOUR TO FIVE DAYS PER WEEK - BOTH WEIGHTBEARING EXERCISES AND AEROBIC ARE RECOMMENDED.  PATIENT IS ADVISED TO FOLLOW A HEALTHY DIET WITH AT LEAST SIX FRUITS/VEGGIES PER DAY, DECREASE INTAKE OF RED MEAT, AND TO INCREASE FISH INTAKE TO TWO DAYS PER WEEK.  MEATS/FISH SHOULD NOT BE FRIED, BAKED OR BROILED IS PREFERABLE.  I SUGGEST WEARING SPF 50 SUNSCREEN ON EXPOSED PARTS AND ESPECIALLY WHEN IN THE DIRECT SUNLIGHT FOR AN EXTENDED PERIOD OF TIME.  PLEASE AVOID FAST FOOD RESTAURANTS AND INCREASE YOUR WATER INTAKE.  2. Diabetes mellitus with stage 1 chronic kidney disease ([place]) Comments: Diabetic foot exam was performed. She wants to wean off of her meds. I advised  patient  that it is not yet time to do so. She was reminded that in order to do so, she must have nearly perfect clean diet free of refined carbs/processed foods and she will need to exercise on a regular basis, no less than 150 minutes per week. I will check labs as listed below. She admits that she did stop meds for a short period of time, to see what her sugars would do. She admits they did increase, she has since resumed all meds.  I DISCUSSED WITH THE PATIENT AT LENGTH REGARDING THE GOALS OF GLYCEMIC CONTROL AND POSSIBLE LONG-TERM COMPLICATIONS.  I  ALSO STRESSED THE IMPORTANCE OF COMPLIANCE WITH HOME GLUCOSE MONITORING, DIETARY RESTRICTIONS INCLUDING AVOIDANCE OF SUGARY DRINKS/PROCESSED FOODS,  ALONG WITH REGULAR EXERCISE.  I  ALSO STRESSED THE IMPORTANCE OF ANNUAL EYE EXAMS, SELF FOOT CARE AND COMPLIANCE WITH OFFICE VISITS.  - POCT Urinalysis Dipstick (81002) - POCT UA - Microalbumin - CMP14+EGFR - CBC - Lipid panel - Hemoglobin A1c  3. Essential hypertension, benign Comments: Chronic, well controlled. She will c/w lisinopril. EKG performed, NSR w/o acute changes. She will f/u in six months for re-evaluation.  - EKG 12-Lead  4. Urinary tract infection without hematuria, site unspecified Comments: Urinalysis abnormal, I will send off urine culture.  - Urine Culture  5. Impacted cerumen of right ear AFTER OBTAINING VERBAL CONSENT, RIGHT EAR WAS FLUSHED BY IRRIGATION. SHE TOLERATED PROCEDURE WELL WITHOUT ANY COMPLICATIONS. NO TM ABNORMALITIES WERE NOTED.  - Ear Lavage  6. Class 1 obesity due to excess calories with serious comorbidity and body mass index (BMI) of 30.0 to 30.9 in adult Comments: Her BMI is acceptable for her demographic. Encouraged to aim for at least 150 minutes of exercise per week.   7. Drug therapy - Vitamin B12  I have personally reviewed and noted the following in the patient's chart:   . Medical and social history . Use of alcohol, tobacco or illicit drugs   . Current medications and supplements . Functional ability and status . Nutritional status . Physical activity . Advanced directives . List of other physicians . Hospitalizations, surgeries, and ER visits in previous 12 months . Vitals . Screenings to include cognitive, depression, and falls . Referrals and appointments  In addition, I have reviewed and discussed with patient certain preventive protocols, quality metrics, and best practice recommendations. A written personalized care plan for preventive services as well as general preventive health recommendations were provided to patient.     [name], MD 07/30/2020

## 2020-07-28 LAB — CMP14+EGFR
ALT: 10 IU/L (ref 0–32)
AST: 14 IU/L (ref 0–40)
Albumin/Globulin Ratio: 1.5 (ref 1.2–2.2)
Albumin: 4.3 g/dL (ref 3.8–4.8)
Alkaline Phosphatase: 61 IU/L (ref 44–121)
BUN/Creatinine Ratio: 19 (ref 12–28)
BUN: 13 mg/dL (ref 8–27)
Bilirubin Total: 0.5 mg/dL (ref 0.0–1.2)
CO2: 23 mmol/L (ref 20–29)
Calcium: 9.5 mg/dL (ref 8.7–10.3)
Chloride: 101 mmol/L (ref 96–106)
Creatinine, Ser: 0.69 mg/dL (ref 0.57–1.00)
GFR calc Af Amer: 106 mL/min/{1.73_m2} (ref >59)
GFR calc non Af Amer: 92 mL/min/{1.73_m2} (ref >59)
Globulin, Total: 2.9 g/dL (ref 1.5–4.5)
Glucose: 80 mg/dL (ref 65–99)
Potassium: 4 mmol/L (ref 3.5–5.2)
Sodium: 138 mmol/L (ref 134–144)
Total Protein: 7.2 g/dL (ref 6.0–8.5)

## 2020-07-28 LAB — LIPID PANEL
Chol/HDL Ratio: 3.3 ratio (ref 0.0–4.4)
Cholesterol, Total: 180 mg/dL (ref 100–199)
HDL: 55 mg/dL (ref >39)
LDL Chol Calc (NIH): 109 mg/dL — ABNORMAL HIGH (ref 0–99)
Triglycerides: 88 mg/dL (ref 0–149)
VLDL Cholesterol Cal: 16 mg/dL (ref 5–40)

## 2020-07-28 LAB — VITAMIN B12: Vitamin B-12: 362 pg/mL (ref 232–1245)

## 2020-07-28 LAB — CBC
Hematocrit: 39.9 % (ref 34.0–46.6)
Hemoglobin: 12.9 g/dL (ref 11.1–15.9)
MCH: 27.3 pg (ref 26.6–33.0)
MCHC: 32.3 g/dL (ref 31.5–35.7)
MCV: 84 fL (ref 79–97)
Platelets: 211 10*3/uL (ref 150–450)
RBC: 4.73 x10E6/uL (ref 3.77–5.28)
RDW: 13.5 % (ref 11.7–15.4)
WBC: 5.8 10*3/uL (ref 3.4–10.8)

## 2020-07-28 LAB — HEMOGLOBIN A1C
Est. average glucose Bld gHb Est-mCnc: 171 mg/dL
Hgb A1c MFr Bld: 7.6 % — ABNORMAL HIGH (ref 4.8–5.6)

## 2020-07-31 ENCOUNTER — Other Ambulatory Visit: Payer: Self-pay

## 2020-07-31 ENCOUNTER — Other Ambulatory Visit: Payer: Self-pay | Admitting: Internal Medicine

## 2020-07-31 ENCOUNTER — Encounter: Payer: Self-pay | Admitting: Internal Medicine

## 2020-07-31 DIAGNOSIS — D519 Vitamin B12 deficiency anemia, unspecified: Secondary | ICD-10-CM

## 2020-07-31 LAB — URINE CULTURE

## 2020-07-31 MED ORDER — NITROFURANTOIN MONOHYD MACRO 100 MG PO CAPS: 100.0000 mg | ORAL_CAPSULE | Freq: Two times a day (BID) | ORAL | 0 refills | Status: DC

## 2020-08-01 ENCOUNTER — Telehealth: Payer: Self-pay

## 2020-08-01 ENCOUNTER — Other Ambulatory Visit: Payer: Self-pay

## 2020-08-01 MED ORDER — NITROFURANTOIN MONOHYD MACRO 100 MG PO CAPS: 100.0000 mg | ORAL_CAPSULE | Freq: Two times a day (BID) | ORAL | 0 refills | Status: AC

## 2020-08-01 NOTE — Telephone Encounter (Signed)
-----   Message from Dorothyann Peng, MD sent at 08/01/2020 12:00 PM EST ----- I added the test on. We are waiting on results ----- Message ----- From: Mariam Dollar, CMA Sent: 08/01/2020  10:24 AM EST To: Dorothyann Peng, MD  The pt wants to know if Dr Allyne Gee checked to see if her insurance would cover a test that she said that the pt needed.  The pt said she thinks it was for b12 but that Dr sanders has the name of the test

## 2020-08-04 LAB — SPECIMEN STATUS REPORT

## 2020-08-04 LAB — METHYLMALONIC ACID, SERUM: Methylmalonic Acid: 122 nmol/L (ref 0–378)

## 2020-08-07 ENCOUNTER — Other Ambulatory Visit: Payer: Self-pay | Admitting: Internal Medicine

## 2020-08-08 ENCOUNTER — Telehealth: Payer: Self-pay

## 2020-08-08 NOTE — Telephone Encounter (Signed)
The pt was told that I would help her remove and reinsert a new Freestyle libre.  The pt is uneasy about replacing the [place] by herself for the first time.

## 2020-08-15 ENCOUNTER — Telehealth: Payer: Self-pay

## 2020-08-15 ENCOUNTER — Other Ambulatory Visit: Payer: Self-pay

## 2020-08-15 ENCOUNTER — Encounter: Payer: Self-pay | Admitting: Internal Medicine

## 2020-08-15 MED ORDER — FREESTYLE LIBRE 14 DAY SENSOR MISC: 2 refills | Status: DC

## 2020-08-15 NOTE — Telephone Encounter (Signed)
The pt was notified that she can pull the [place] out her arm herself and that it's not a needle in the [place] and the pt said that she can't pull it out herself.  The pt was told that I would help her and the pt said she would be by before 12:30.

## 2020-08-24 ENCOUNTER — Encounter: Payer: Self-pay | Admitting: Internal Medicine

## 2020-08-28 ENCOUNTER — Encounter: Payer: Self-pay | Admitting: Internal Medicine

## 2020-08-28 ENCOUNTER — Other Ambulatory Visit: Payer: Self-pay

## 2020-08-28 ENCOUNTER — Ambulatory Visit (INDEPENDENT_AMBULATORY_CARE_PROVIDER_SITE_OTHER): Payer: Medicare PPO | Admitting: Internal Medicine

## 2020-08-28 VITALS — BP 164/100 | HR 99 | Temp 98.1°F | Ht 60.6 in | Wt 162.0 lb

## 2020-08-28 DIAGNOSIS — I129 Hypertensive chronic kidney disease with stage 1 through stage 4 chronic kidney disease, or unspecified chronic kidney disease: Secondary | ICD-10-CM | POA: Diagnosis not present

## 2020-08-28 DIAGNOSIS — D519 Vitamin B12 deficiency anemia, unspecified: Secondary | ICD-10-CM

## 2020-08-28 DIAGNOSIS — E1122 Type 2 diabetes mellitus with diabetic chronic kidney disease: Secondary | ICD-10-CM | POA: Diagnosis not present

## 2020-08-28 DIAGNOSIS — N181 Chronic kidney disease, stage 1: Secondary | ICD-10-CM | POA: Diagnosis not present

## 2020-08-28 MED ORDER — CYANOCOBALAMIN 1000 MCG/ML IJ SOLN
1000.0000 ug | Freq: Once | INTRAMUSCULAR | Status: AC
  Administered 2020-08-28: 1000 ug via INTRAMUSCULAR

## 2020-08-28 NOTE — Patient Instructions (Signed)
Vitamin B12 Deficiency Vitamin B12 deficiency occurs when the body does not have enough vitamin B12, which is an important vitamin. The body needs this vitamin:  To make red blood cells.  To make DNA. This is the genetic material inside cells.  To help the nerves work properly so they can carry messages from the brain to the body. Vitamin B12 deficiency can cause various health problems, such as a low red blood cell count (anemia) or nerve damage. What are the causes? This condition may be caused by:  Not eating enough foods that contain vitamin B12.  Not having enough stomach acid and digestive fluids to properly absorb vitamin B12 from the food that you eat.  Certain digestive system diseases that make it hard to absorb vitamin B12. These diseases include Crohn's disease, chronic pancreatitis, and cystic fibrosis.  A condition in which the body does not make enough of a protein (intrinsic factor), resulting in too few red blood cells (pernicious anemia).  Having a surgery in which part of the stomach or small intestine is removed.  Taking certain medicines that make it hard for the body to absorb vitamin B12. These medicines include: ? Heartburn medicines (antacids and proton pump inhibitors). ? Certain antibiotic medicines. ? Some medicines that are used to treat diabetes, tuberculosis, gout, or high cholesterol. What increases the risk? The following factors may make you more likely to develop a B12 deficiency:  Being older than age 50.  Eating a vegetarian or vegan diet, especially while you are pregnant.  Eating a poor diet while you are pregnant.  Taking certain medicines.  Having alcoholism. What are the signs or symptoms? In some cases, there are no symptoms of this condition. If the condition leads to anemia or nerve damage, various symptoms can occur, such as:  Weakness.  Fatigue.  Loss of appetite.  Weight loss.  Numbness or tingling in your hands and  feet.  Redness and burning of the tongue.  Confusion or memory problems.  Depression.  Sensory problems, such as color blindness, ringing in the ears, or loss of taste.  Diarrhea or constipation.  Trouble walking. If anemia is severe, symptoms can include:  Shortness of breath.  Dizziness.  Rapid heart rate (tachycardia). How is this diagnosed? This condition may be diagnosed with a blood test to measure the level of vitamin B12 in your blood. You may also have other tests, including:  A group of tests that measure certain characteristics of blood cells (complete blood count, CBC).  A blood test to measure intrinsic factor.  A procedure where a thin tube with a camera on the end is used to look into your stomach or intestines (endoscopy). Other tests may be needed to discover the cause of B12 deficiency. How is this treated? Treatment for this condition depends on the cause. This condition may be treated by:  Changing your eating and drinking habits, such as: ? Eating more foods that contain vitamin B12. ? Drinking less alcohol or no alcohol.  Getting vitamin B12 injections.  Taking vitamin B12 supplements. Your health care provider will tell you which dosage is best for you. Follow these instructions at home: Eating and drinking  Eat lots of healthy foods that contain vitamin B12, including: ? Meats and poultry. This includes beef, pork, chicken, turkey, and organ meats, such as liver. ? Seafood. This includes clams, rainbow trout, salmon, tuna, and haddock. ? Eggs. ? Cereal and dairy products that are fortified. This means that vitamin B12 has   been added to the food. Check the label on the package to see if the food is fortified. The items listed above may not be a complete list of recommended foods and beverages. Contact a dietitian for more information.   General instructions  Get any injections that are prescribed by your health care provider.  Take  supplements only as told by your health care provider. Follow the directions carefully.  Do not drink alcohol if your health care provider tells you not to. In some cases, you may only be asked to limit alcohol use.  Keep all follow-up visits as told by your health care provider. This is important. Contact a health care provider if:  Your symptoms come back. Get help right away if you:  Develop shortness of breath.  Have a rapid heart rate.  Have chest pain.  Become dizzy or lose consciousness. Summary  Vitamin B12 deficiency occurs when the body does not have enough vitamin B12.  The main causes of vitamin B12 deficiency include dietary deficiency, digestive diseases, pernicious anemia, and having a surgery in which part of the stomach or small intestine is removed.  In some cases, there are no symptoms of this condition. If the condition leads to anemia or nerve damage, various symptoms can occur, such as weakness, shortness of breath, and numbness.  Treatment may include getting vitamin B12 injections or taking vitamin B12 supplements. Eat lots of healthy foods that contain vitamin B12. This information is not intended to replace advice given to you by your health care provider. Make sure you discuss any questions you have with your health care provider. Document Revised: 12/11/2018 Document Reviewed: 03/03/2018 Elsevier Patient Education  2021 Elsevier Inc.  

## 2020-08-28 NOTE — Progress Notes (Signed)
I,[name],acting as a Education administrator for [name], MD.,have documented all relevant documentation on the behalf of [name], MD,as directed by  [name], MD while in the presence of [name], MD.  This visit occurred during the SARS-CoV-2 public health emergency.  Safety protocols were in place, including screening questions prior to the visit, additional usage of staff PPE, and extensive cleaning of exam room while observing appropriate contact time as indicated for disinfecting solutions.  Subjective:     Patient ID: Sharon Mcdonald , female    DOB: 1956/01/27 , 65 y.o.   MRN: [id number]   Chief Complaint  Patient presents with  . B12 Injection    HPI  The patient is here today for a vitamin b12 injection. She c/o tingling in upper/lower extremities. Denies memory/cognitive issues and imbalance.     Past Medical History:  Diagnosis Date  . Diabetes mellitus   . High cholesterol   . Hypertension      Family History  Problem Relation Age of Onset  . Diabetes Mother   . Hypertension Mother   . Obesity Mother   . Diabetes Brother      Current Outpatient Medications:  .  BD PEN NEEDLE NANO U/F 32G X 4 MM MISC, USE WITH INSULIN DAILY AND OZEMPIC ONCE WEEKLY, Disp: 100 each, Rfl: 5 .  Continuous Blood Gluc Sensor (FREESTYLE LIBRE 14 DAY SENSOR) MISC, Use as directed to check blood sugars 4 times per day dx:e11.65, Disp: 2 each, Rfl: 2 .  dorzolamide-timolol (COSOPT) 22.3-6.8 MG/ML ophthalmic solution, 1 drop 2 (two) times daily., Disp: , Rfl:  .  Empagliflozin-metFORMIN HCl ER (SYNJARDY XR) 12.11-998 MG TB24, Take 2 tablets by mouth daily., Disp: 60 tablet, Rfl: 5 .  gabapentin (NEURONTIN) 300 MG capsule, TAKE 1 CAPSULE BY MOUTH EVERY DAY, Disp: 90 capsule, Rfl: 2 .  glucose monitoring kit (FREESTYLE) monitoring kit, Use to check blood sugars 3 times daily. Dx code:e11.65, Disp: 1 each, Rfl: 3 .  Lancets (FREESTYLE) lancets, Use as directed to check  blood sugars 3 times per day dx: E11.65, Disp: 150 each, Rfl: 3 .  LEVEMIR FLEXTOUCH 100 UNIT/ML FlexPen, INJECT 27 UNITS INTO THE SKIN AT BEDTIME, max titration dose 60 units (Patient taking differently: 20 Units. INJECT 27 UNITS INTO THE SKIN AT BEDTIME, max titration dose 60 units), Disp: 15 mL, Rfl: 1 .  lisinopril (ZESTRIL) 20 MG tablet, TAKE 1 TABLET BY MOUTH EVERY DAY, Disp: 90 tablet, Rfl: 2 .  OZEMPIC, 1 MG/DOSE, 4 MG/3ML SOPN, INJECT 1 MG SUBCUTANEOUSLY EVERY WEEK ON SAME DAY OF EACH WEEK, IN ABDOMEN, TIGHS, OR UPPER ARM. ROTATING INJECTION SITES, Disp: 3 mL, Rfl: 1 .  pravastatin (PRAVACHOL) 20 MG tablet, TAKE 1 TABLET BY MOUTH EVERY DAY, Disp: 90 tablet, Rfl: 2 .  Semaglutide, 1 MG/DOSE, (OZEMPIC, 1 MG/DOSE,) 2 MG/1.5ML SOPN, INJECT 1MG SUBCUTANEOUSLY EVERY WEEK ON THE SAME DAY OF EACH WEEK, IN THE ABDOMEN, THIGHS, OR UPPER ARM ROTATING INJECTION SITES, Disp: 9 pen, Rfl: 1   No Known Allergies   Review of Systems  Constitutional: Negative.   Respiratory: Negative.   Cardiovascular: Negative.   Gastrointestinal: Negative.   Psychiatric/Behavioral: Negative.   All other systems reviewed and are negative.    Today's Vitals   08/28/20 1413  BP: (!) 164/100  Pulse: 99  Temp: 98.1 F (36.7 C)  TempSrc: Oral  Weight: 162 lb (73.5 kg)  Height: 5' 0.6" (1.539 m)  PainSc: 0-No pain  Body mass index is 31.02 kg/m.  Wt Readings from Last 3 Encounters:  08/28/20 162 lb (73.5 kg)  07/27/20 156 lb 6.4 oz (70.9 kg)  04/11/20 157 lb 3.2 oz (71.3 kg)   Objective:  Physical Exam Vitals and nursing note reviewed.  Constitutional:      Appearance: Normal appearance. She is obese.  HENT:     Head: Normocephalic and atraumatic.  Cardiovascular:     Rate and Rhythm: Normal rate and regular rhythm.     Heart sounds: Normal heart sounds.  Pulmonary:     Breath sounds: Normal breath sounds.  Skin:    General: Skin is warm.  Neurological:     General: No focal deficit present.      Mental Status: She is alert and oriented to person, place, and time.         Assessment And Plan:     1. Anemia due to vitamin B12 deficiency, unspecified B12 deficiency type Comments: She was given vitamin B12 IM x 1. She will rto every two weeks x 4 times. I will see her in May for f/u.  - cyanocobalamin ((VITAMIN B-12)) injection 1,000 mcg  2. Parenchymal renal hypertension, stage 1 through stage 4 or unspecified chronic kidney disease Comments: Uncontrolled. She admits that she likely forgot to take her lisinopril last night. Advised to take meds as soon as she gets home.   3. Diabetes mellitus with stage 1 chronic kidney disease ([place]) Comments: Chronic. She is advised to check sugars qid since she is taking insulin. Pt will require mealtime insulin if her sugars continue to rise. Last visit a1c 7.6.    Patient was given opportunity to ask questions. Patient verbalized understanding of the plan and was able to repeat key elements of the plan. All questions were answered to their satisfaction.   I, [name], MD, have reviewed all documentation for this visit. The documentation on 08/28/20 for the exam, diagnosis, procedures, and orders are all accurate and complete.  THE PATIENT IS ENCOURAGED TO PRACTICE SOCIAL DISTANCING DUE TO THE COVID-19 PANDEMIC.

## 2020-09-05 ENCOUNTER — Encounter: Payer: Self-pay | Admitting: Internal Medicine

## 2020-09-19 ENCOUNTER — Other Ambulatory Visit: Payer: Self-pay

## 2020-09-19 ENCOUNTER — Ambulatory Visit (INDEPENDENT_AMBULATORY_CARE_PROVIDER_SITE_OTHER): Payer: Medicare PPO

## 2020-09-19 VITALS — BP 138/78 | HR 83 | Temp 98.1°F | Ht 60.0 in | Wt 161.4 lb

## 2020-09-19 DIAGNOSIS — D519 Vitamin B12 deficiency anemia, unspecified: Secondary | ICD-10-CM | POA: Diagnosis not present

## 2020-09-19 MED ORDER — CYANOCOBALAMIN 1000 MCG/ML IJ SOLN
1000.0000 ug | Freq: Once | INTRAMUSCULAR | Status: AC
Start: 2020-09-19 — End: 2020-09-19
  Administered 2020-09-19: 1000 ug via INTRAMUSCULAR

## 2020-09-26 ENCOUNTER — Other Ambulatory Visit: Payer: Self-pay

## 2020-09-26 ENCOUNTER — Ambulatory Visit (INDEPENDENT_AMBULATORY_CARE_PROVIDER_SITE_OTHER): Payer: Medicare PPO

## 2020-09-26 VITALS — BP 128/72 | HR 60 | Temp 98.3°F | Ht 60.0 in | Wt 160.8 lb

## 2020-09-26 DIAGNOSIS — D519 Vitamin B12 deficiency anemia, unspecified: Secondary | ICD-10-CM | POA: Diagnosis not present

## 2020-09-26 MED ORDER — CYANOCOBALAMIN 1000 MCG/ML IJ SOLN
1000.0000 ug | Freq: Once | INTRAMUSCULAR | Status: AC
Start: 2020-09-26 — End: 2020-09-26
  Administered 2020-09-26: 1000 ug via INTRAMUSCULAR

## 2020-09-26 NOTE — Progress Notes (Signed)
Patient is here for monthly b12 injection.

## 2020-09-26 NOTE — Patient Instructions (Signed)

## 2020-09-29 LAB — HM DIABETES EYE EXAM

## 2020-10-24 ENCOUNTER — Ambulatory Visit (INDEPENDENT_AMBULATORY_CARE_PROVIDER_SITE_OTHER): Payer: Medicare PPO

## 2020-10-24 ENCOUNTER — Other Ambulatory Visit: Payer: Self-pay

## 2020-10-24 VITALS — BP 134/78 | HR 85 | Temp 98.4°F | Ht 60.0 in | Wt 162.4 lb

## 2020-10-24 DIAGNOSIS — D519 Vitamin B12 deficiency anemia, unspecified: Secondary | ICD-10-CM

## 2020-10-24 MED ORDER — CYANOCOBALAMIN 1000 MCG/ML IJ SOLN
1000.0000 ug | Freq: Once | INTRAMUSCULAR | Status: AC
  Administered 2020-10-24: 1000 ug via INTRAMUSCULAR

## 2020-10-24 NOTE — Progress Notes (Signed)
Pt is here for b12 injection. 

## 2020-10-27 ENCOUNTER — Encounter: Payer: Self-pay | Admitting: Internal Medicine

## 2020-10-30 ENCOUNTER — Ambulatory Visit (INDEPENDENT_AMBULATORY_CARE_PROVIDER_SITE_OTHER): Payer: Medicare PPO | Admitting: Nurse Practitioner

## 2020-10-30 ENCOUNTER — Encounter: Payer: Self-pay | Admitting: Internal Medicine

## 2020-10-30 ENCOUNTER — Encounter: Payer: Self-pay | Admitting: Nurse Practitioner

## 2020-10-30 ENCOUNTER — Other Ambulatory Visit: Payer: Self-pay

## 2020-10-30 VITALS — Temp 98.1°F | Ht 60.0 in | Wt 160.4 lb

## 2020-10-30 DIAGNOSIS — E1122 Type 2 diabetes mellitus with diabetic chronic kidney disease: Secondary | ICD-10-CM

## 2020-10-30 DIAGNOSIS — E1169 Type 2 diabetes mellitus with other specified complication: Secondary | ICD-10-CM | POA: Diagnosis not present

## 2020-10-30 DIAGNOSIS — R42 Dizziness and giddiness: Secondary | ICD-10-CM | POA: Diagnosis not present

## 2020-10-30 DIAGNOSIS — N181 Chronic kidney disease, stage 1: Secondary | ICD-10-CM | POA: Diagnosis not present

## 2020-10-30 MED ORDER — MECLIZINE HCL 12.5 MG PO TABS: 12.5000 mg | ORAL_TABLET | Freq: Three times a day (TID) | ORAL | 0 refills | Status: DC | PRN

## 2020-10-30 MED ORDER — CYANOCOBALAMIN 1000 MCG/ML IJ SOLN
1000.0000 ug | Freq: Once | INTRAMUSCULAR | Status: AC
  Administered 2020-10-30: 1000 ug via INTRAMUSCULAR

## 2020-10-30 NOTE — Progress Notes (Signed)
I,[employer] as a Education administrator for [employer], FNP.,have documented all relevant documentation on the behalf of Sharon Brine, FNP,as directed by  Sharon Brine, FNP while in the presence of Sharon Mcdonald, [place]. This visit occurred during the SARS-CoV-2 public health emergency.  Safety protocols were in place, including screening questions prior to the visit, additional usage of staff PPE, and extensive cleaning of exam room while observing appropriate contact time as indicated for disinfecting solutions.  Subjective:     Patient ID: Sharon Mcdonald , female    DOB: 1955/11/16 , 65 y.o.   MRN: [id number]   Chief Complaint  Patient presents with  . Dizziness    Patient stated she has been having some dizziness since Friday   . Diabetes    HPI  Patient presents today for an eval on dizziness. She stated she has been having it since Friday. She stated it has been better since Friday. She also reports she stopped taking her levemir, ozempic and synjardy 3 weeks ago, states "I am trusting the fact that I am healed". She states she does not like the way the medications make her feel. Blood sugar this morning was 198, Sunday 24th was 350 at 930, then took 2 synjardy then at 10:47 ate breakfast and at 1p was 314. 115p 2 more synjardy then at 4pm her blood sugar was down to 214.  She feels like her dizziness is better since her blood sugar has improved. She thought this was vertigo. She did take motion sickness medication.   Reports she drinks adequate amounts of water daily and walks daily recently.      Past Medical History:  Diagnosis Date  . Diabetes mellitus   . High cholesterol   . Hypertension      Family History  Problem Relation Age of Onset  . Diabetes Mother   . Hypertension Mother   . Obesity Mother   . Diabetes Brother      Current Outpatient Medications:  .  BD PEN NEEDLE NANO U/F 32G X 4 MM MISC, USE WITH INSULIN DAILY AND OZEMPIC ONCE WEEKLY, Disp: 100 each,  Rfl: 5 .  dorzolamide-timolol (COSOPT) 22.3-6.8 MG/ML ophthalmic solution, 1 drop 2 (two) times daily., Disp: , Rfl:  .  Empagliflozin-metFORMIN HCl ER (SYNJARDY XR) 12.11-998 MG TB24, Take 2 tablets by mouth daily., Disp: 60 tablet, Rfl: 5 .  gabapentin (NEURONTIN) 300 MG capsule, TAKE 1 CAPSULE BY MOUTH EVERY DAY, Disp: 90 capsule, Rfl: 2 .  glucose monitoring kit (FREESTYLE) monitoring kit, Use to check blood sugars 3 times daily. Dx code:e11.65, Disp: 1 each, Rfl: 3 .  Lancets (FREESTYLE) lancets, Use as directed to check blood sugars 3 times per day dx: E11.65, Disp: 150 each, Rfl: 3 .  lisinopril (ZESTRIL) 20 MG tablet, TAKE 1 TABLET BY MOUTH EVERY DAY, Disp: 90 tablet, Rfl: 2 .  meclizine (ANTIVERT) 12.5 MG tablet, Take 1 tablet (12.5 mg total) by mouth 3 (three) times daily as needed for dizziness., Disp: 30 tablet, Rfl: 0 .  pravastatin (PRAVACHOL) 20 MG tablet, TAKE 1 TABLET BY MOUTH EVERY DAY, Disp: 90 tablet, Rfl: 2 .  Continuous Blood Gluc Sensor (FREESTYLE LIBRE 14 DAY SENSOR) MISC, Use as directed to check blood sugars 4 times per day dx:e11.65 (Patient not taking: Reported on 10/30/2020), Disp: 2 each, Rfl: 2 .  LEVEMIR FLEXTOUCH 100 UNIT/ML FlexPen, INJECT 27 UNITS INTO THE SKIN AT BEDTIME, max titration dose 60 units (Patient not taking: Reported on 10/30/2020), Disp:  15 mL, Rfl: 1 .  OZEMPIC, 1 MG/DOSE, 4 MG/3ML SOPN, INJECT 1 MG SUBCUTANEOUSLY EVERY WEEK ON SAME DAY OF EACH WEEK, IN ABDOMEN, TIGHS, OR UPPER ARM. ROTATING INJECTION SITES (Patient not taking: Reported on 10/30/2020), Disp: 3 mL, Rfl: 1 .  Semaglutide, 1 MG/DOSE, (OZEMPIC, 1 MG/DOSE,) 2 MG/1.5ML SOPN, INJECT 1MG SUBCUTANEOUSLY EVERY WEEK ON THE SAME DAY OF EACH WEEK, IN THE ABDOMEN, THIGHS, OR UPPER ARM ROTATING INJECTION SITES (Patient not taking: Reported on 10/30/2020), Disp: 9 pen, Rfl: 1   No Known Allergies   Review of Systems  Constitutional: Negative.   Respiratory: Negative.   Cardiovascular: Negative.   Negative for chest pain, palpitations and leg swelling.  Endocrine: Negative for polydipsia, polyphagia and polyuria.  Neurological: Negative for dizziness and headaches.  Psychiatric/Behavioral: Negative.      Today's Vitals   10/30/20 1223  Temp: 98.1 F (36.7 C)  TempSrc: Oral  Weight: 160 lb 6.4 oz (72.8 kg)  Height: 5' (1.524 m)  PainSc: 0-No pain   Body mass index is 31.33 kg/m.   Objective:  Physical Exam Constitutional:      General: She is not in acute distress.    Appearance: Normal appearance.  Cardiovascular:     Rate and Rhythm: Normal rate and regular rhythm.     Pulses: Normal pulses.     Heart sounds: Normal heart sounds. No murmur heard.   Pulmonary:     Effort: Pulmonary effort is normal. No respiratory distress.     Breath sounds: Normal breath sounds. No wheezing.  Skin:    Capillary Refill: Capillary refill takes less than 2 seconds.  Neurological:     General: No focal deficit present.     Mental Status: She is alert and oriented to person, place, and time.     Cranial Nerves: No cranial nerve deficit.     Sensory: No sensory deficit.     Motor: No weakness.  Psychiatric:        Mood and Affect: Mood normal.        Behavior: Behavior normal.        Thought Content: Thought content normal.        Judgment: Judgment normal.         Assessment And Plan:     1. Dizziness Her dizziness may be coming from her increased blood sugars but will treat with meclizine.  - CBC - meclizine (ANTIVERT) 12.5 MG tablet; Take 1 tablet (12.5 mg total) by mouth 3 (three) times daily as needed for dizziness.  Dispense: 30 tablet; Refill: 0  2. Type 2 diabetes mellitus with other specified complication, unspecified whether long term insulin use (HCC)  Has been having an elevated blood sugar  She is resistant to taking insulin in spite of elevated HgbA1c, we discussed the risk factors of poorly controlled diabetes to include risk for stroke and heart  attacks  I will refer to Endocrinology for further evaluation - Ambulatory referral to Endocrinology - Hemoglobin A1c - CMP14+EGFR - cyanocobalamin ((VITAMIN B-12)) injection 1,000 mcg     Patient was given opportunity to ask questions. Patient verbalized understanding of the plan and was able to repeat key elements of the plan. All questions were answered to their satisfaction.  Janece Moore, FNP   I, Janece Moore, FNP, have reviewed all documentation for this visit. The documentation on 11/03/20 for the exam, diagnosis, procedures, and orders are all accurate and complete.   IF YOU HAVE BEEN REFERRED TO A SPECIALIST, IT   MAY TAKE 1-2 WEEKS TO SCHEDULE/PROCESS THE REFERRAL. IF YOU HAVE NOT HEARD FROM US/SPECIALIST IN TWO WEEKS, PLEASE GIVE [place] A CALL AT [phone].   THE PATIENT IS ENCOURAGED TO PRACTICE SOCIAL DISTANCING DUE TO THE COVID-19 PANDEMIC.

## 2020-10-31 ENCOUNTER — Ambulatory Visit: Payer: Medicare PPO

## 2020-10-31 LAB — CMP14+EGFR
ALT: 20 IU/L (ref 0–32)
AST: 12 IU/L (ref 0–40)
Albumin/Globulin Ratio: 1.2 (ref 1.2–2.2)
Albumin: 4.2 g/dL (ref 3.8–4.8)
Alkaline Phosphatase: 92 IU/L (ref 44–121)
BUN/Creatinine Ratio: 20 (ref 12–28)
BUN: 23 mg/dL (ref 8–27)
Bilirubin Total: 0.6 mg/dL (ref 0.0–1.2)
CO2: 22 mmol/L (ref 20–29)
Calcium: 10.3 mg/dL (ref 8.7–10.3)
Chloride: 96 mmol/L (ref 96–106)
Creatinine, Ser: 1.16 mg/dL — ABNORMAL HIGH (ref 0.57–1.00)
Globulin, Total: 3.5 g/dL (ref 1.5–4.5)
Glucose: 263 mg/dL — ABNORMAL HIGH (ref 65–99)
Potassium: 4.8 mmol/L (ref 3.5–5.2)
Sodium: 138 mmol/L (ref 134–144)
Total Protein: 7.7 g/dL (ref 6.0–8.5)
eGFR: 52 mL/min/{1.73_m2} — ABNORMAL LOW (ref >59)

## 2020-10-31 LAB — HEMOGLOBIN A1C
Est. average glucose Bld gHb Est-mCnc: 306 mg/dL
Hgb A1c MFr Bld: 12.3 % — ABNORMAL HIGH (ref 4.8–5.6)

## 2020-10-31 LAB — CBC
Hematocrit: 43.9 % (ref 34.0–46.6)
Hemoglobin: 14 g/dL (ref 11.1–15.9)
MCH: 26.7 pg (ref 26.6–33.0)
MCHC: 31.9 g/dL (ref 31.5–35.7)
MCV: 84 fL (ref 79–97)
Platelets: 213 10*3/uL (ref 150–450)
RBC: 5.24 x10E6/uL (ref 3.77–5.28)
RDW: 13.1 % (ref 11.7–15.4)
WBC: 5.4 10*3/uL (ref 3.4–10.8)

## 2020-11-01 DIAGNOSIS — H43813 Vitreous degeneration, bilateral: Secondary | ICD-10-CM | POA: Diagnosis not present

## 2020-11-01 DIAGNOSIS — H3582 Retinal ischemia: Secondary | ICD-10-CM | POA: Diagnosis not present

## 2020-11-01 DIAGNOSIS — E113513 Type 2 diabetes mellitus with proliferative diabetic retinopathy with macular edema, bilateral: Secondary | ICD-10-CM | POA: Diagnosis not present

## 2020-11-03 ENCOUNTER — Telehealth: Payer: Self-pay

## 2020-11-03 NOTE — Telephone Encounter (Signed)
I called and left pt vm letting her know that the vomiting she is experiencing is due to her diabetes being uncontrolled. [name] recommends that she continue to push fluids, take her medications as directed and if she is still vomiting tomorrow she needs to go to the ER [facility]

## 2020-11-08 ENCOUNTER — Encounter: Payer: Self-pay | Admitting: Internal Medicine

## 2020-11-27 ENCOUNTER — Other Ambulatory Visit: Payer: Self-pay

## 2020-11-27 ENCOUNTER — Ambulatory Visit (INDEPENDENT_AMBULATORY_CARE_PROVIDER_SITE_OTHER): Payer: Medicare PPO | Admitting: Internal Medicine

## 2020-11-27 ENCOUNTER — Encounter: Payer: Self-pay | Admitting: Internal Medicine

## 2020-11-27 VITALS — BP 140/82 | HR 93 | Temp 98.2°F | Ht 60.0 in | Wt 169.0 lb

## 2020-11-27 DIAGNOSIS — Z0000 Encounter for general adult medical examination without abnormal findings: Secondary | ICD-10-CM

## 2020-11-27 DIAGNOSIS — N181 Chronic kidney disease, stage 1: Secondary | ICD-10-CM | POA: Diagnosis not present

## 2020-11-27 DIAGNOSIS — E66811 Obesity, class 1: Secondary | ICD-10-CM

## 2020-11-27 DIAGNOSIS — Z6833 Body mass index (BMI) 33.0-33.9, adult: Secondary | ICD-10-CM | POA: Diagnosis not present

## 2020-11-27 DIAGNOSIS — I129 Hypertensive chronic kidney disease with stage 1 through stage 4 chronic kidney disease, or unspecified chronic kidney disease: Secondary | ICD-10-CM | POA: Diagnosis not present

## 2020-11-27 DIAGNOSIS — E1165 Type 2 diabetes mellitus with hyperglycemia: Secondary | ICD-10-CM

## 2020-11-27 DIAGNOSIS — E2839 Other primary ovarian failure: Secondary | ICD-10-CM | POA: Diagnosis not present

## 2020-11-27 DIAGNOSIS — R8290 Unspecified abnormal findings in urine: Secondary | ICD-10-CM

## 2020-11-27 DIAGNOSIS — Z794 Long term (current) use of insulin: Secondary | ICD-10-CM | POA: Diagnosis not present

## 2020-11-27 DIAGNOSIS — Z23 Encounter for immunization: Secondary | ICD-10-CM

## 2020-11-27 DIAGNOSIS — E6609 Other obesity due to excess calories: Secondary | ICD-10-CM

## 2020-11-27 LAB — POCT UA - MICROALBUMIN
Albumin/Creatinine Ratio, Urine, POC: 300
Creatinine, POC: 300 mg/dL
Microalbumin Ur, POC: 80 mg/L

## 2020-11-27 LAB — POCT URINALYSIS DIPSTICK
Bilirubin, UA: NEGATIVE
Blood, UA: NEGATIVE
Glucose, UA: NEGATIVE
Ketones, UA: NEGATIVE
Nitrite, UA: POSITIVE
Protein, UA: NEGATIVE
Spec Grav, UA: >=1.03 — AB (ref 1.010–1.025)
Urobilinogen, UA: 0.2 E.U./dL
pH, UA: 6 (ref 5.0–8.0)

## 2020-11-27 IMAGING — DX DG WRIST COMPLETE 3+V*L*
4 series · 4 of 4 positions shown · non-contrast
Comparison: None.

CLINICAL DATA: Left wrist pain after fall.

EXAM:
LEFT WRIST - COMPLETE 3+ VIEW

[wrist pa]
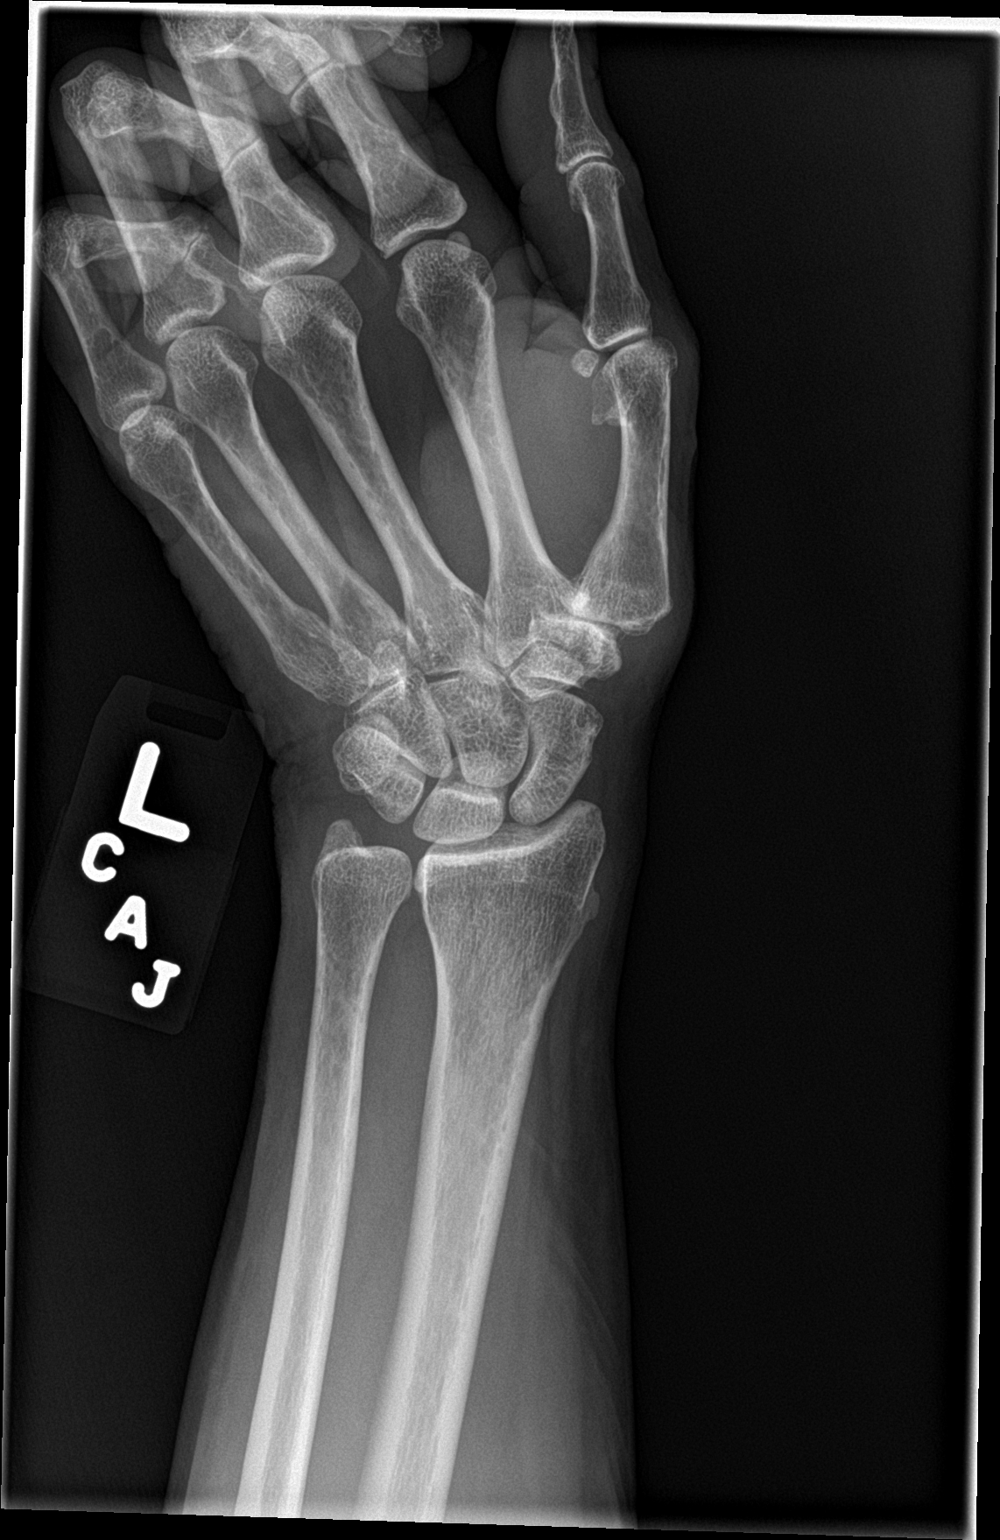

[wrist obl]
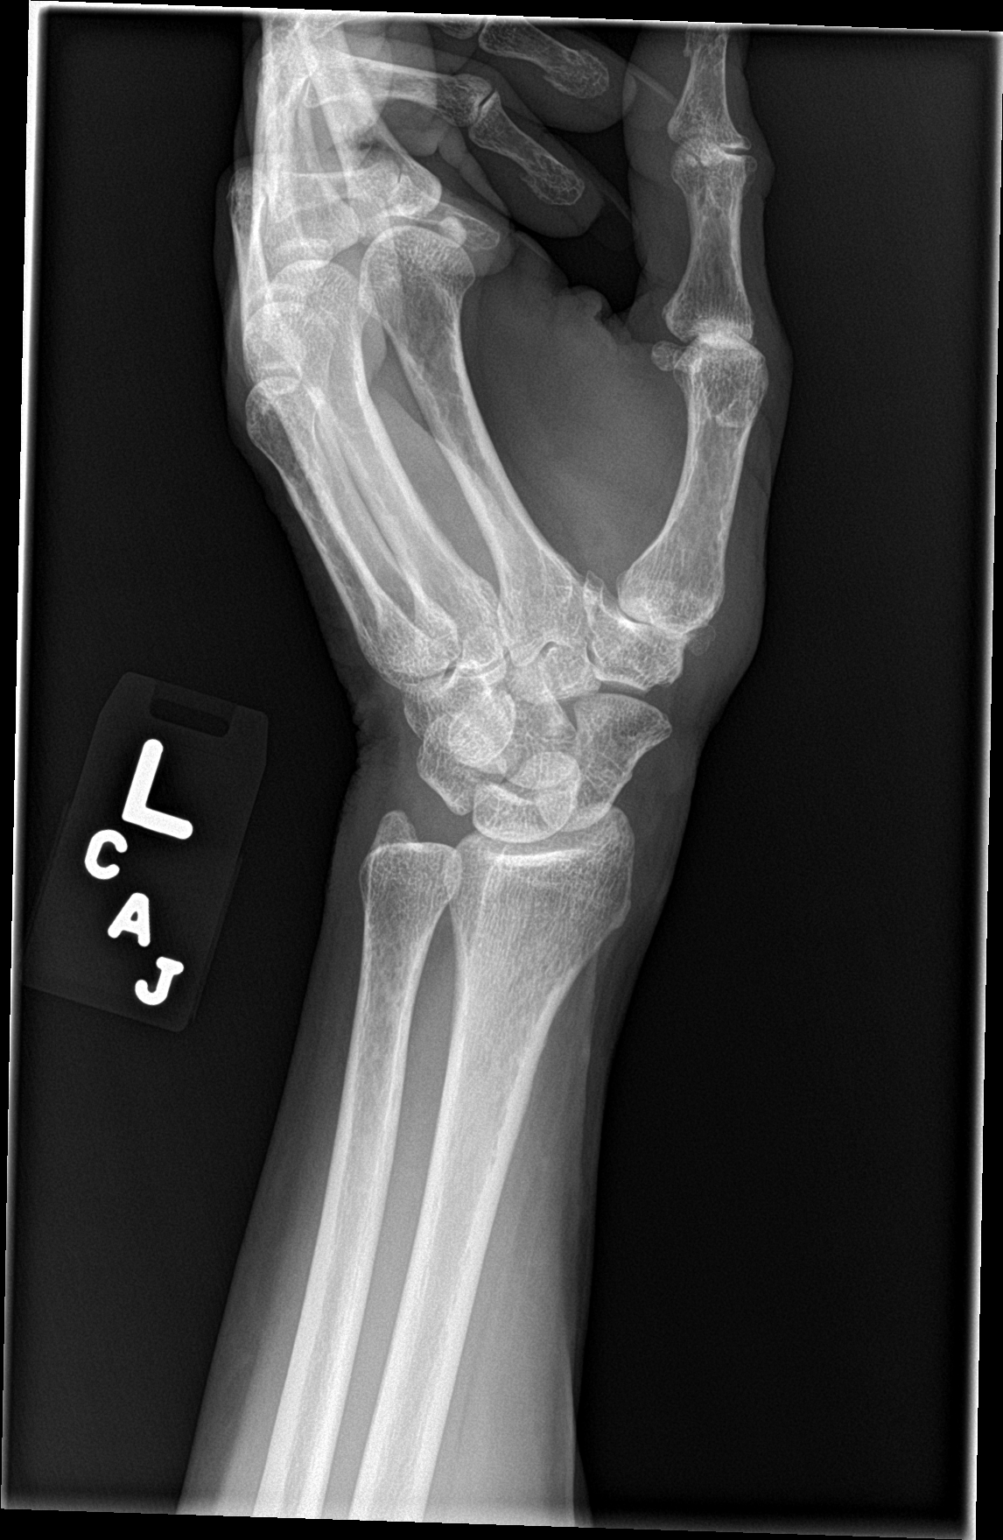

[wrist lat]
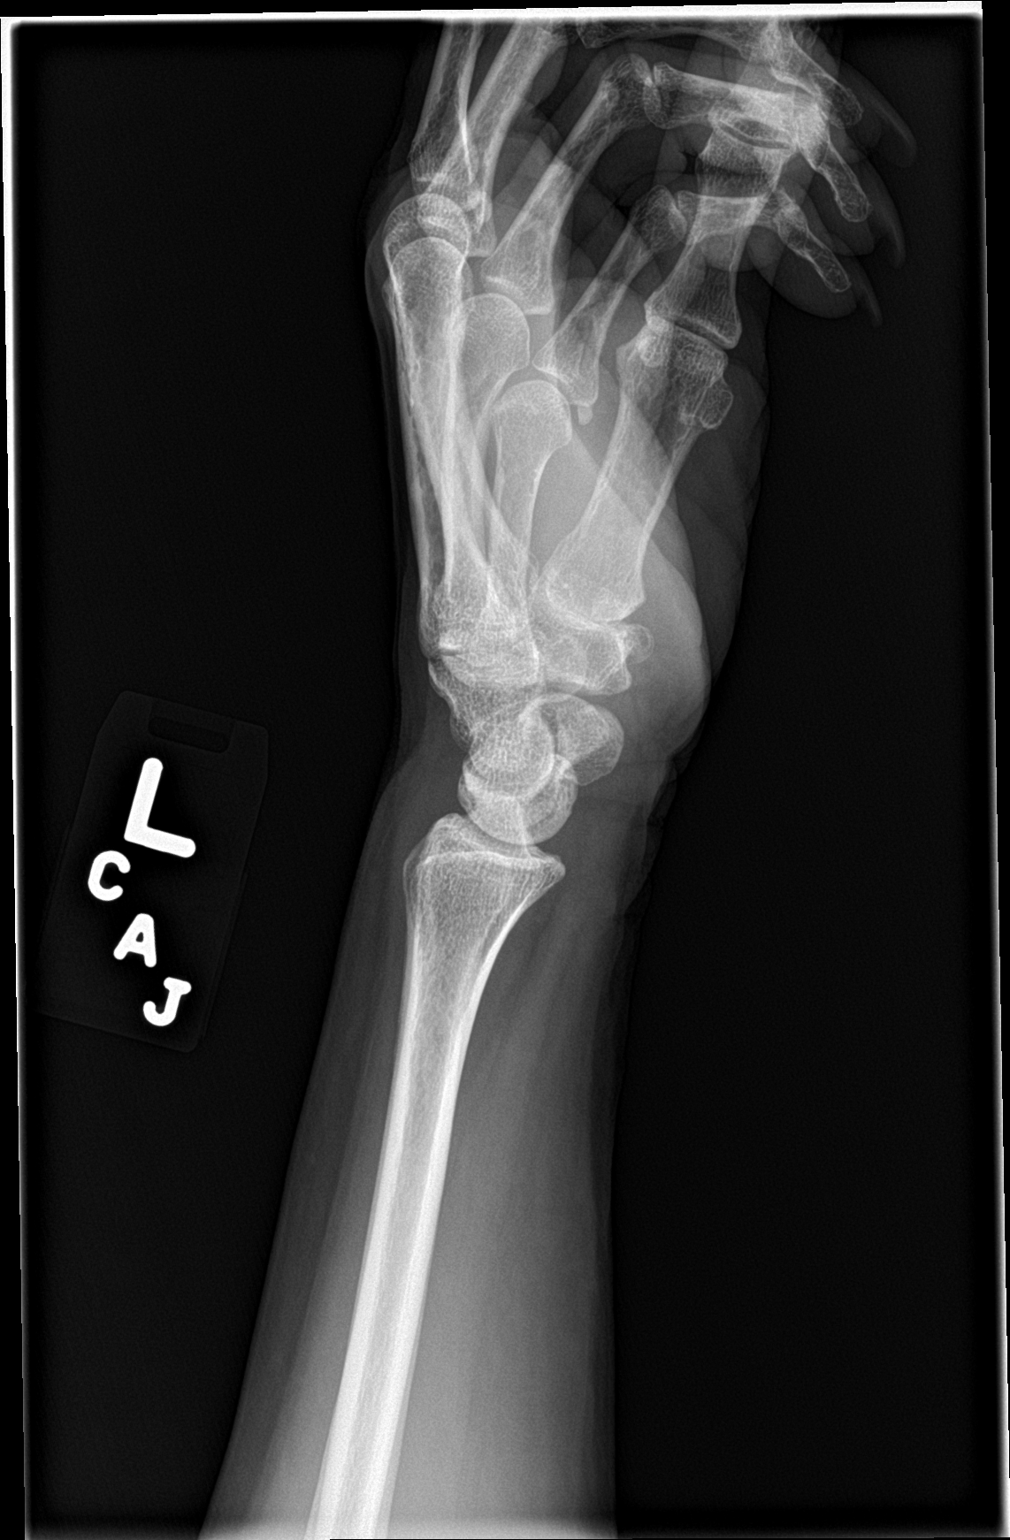

[wrist navicular]
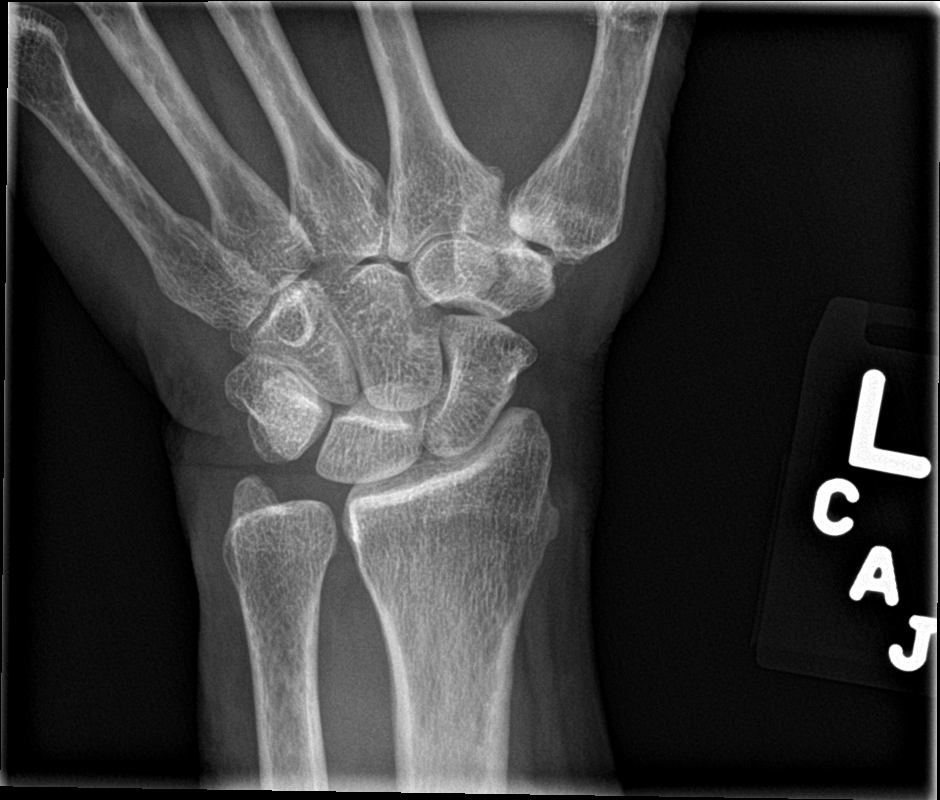

[4 of 4 positions shown; findings below may reference images not displayed]

FINDINGS: There is no evidence of fracture or dislocation. Mild degenerative
change at the thumb carpal metacarpal joint and triscaphe joint.
Soft tissues are unremarkable.
IMPRESSION: No fracture or subluxation of the left wrist.

## 2020-11-27 MED ORDER — ACCU-CHEK GUIDE ME W/DEVICE KIT: PACK | 1 refills | Status: DC

## 2020-11-27 MED ORDER — ACCU-CHEK GUIDE VI STRP: ORAL_STRIP | 11 refills | Status: DC

## 2020-11-27 MED ORDER — PREVNAR 20 0.5 ML IM SUSY: 0.5000 mL | PREFILLED_SYRINGE | INTRAMUSCULAR | 0 refills | Status: AC

## 2020-11-27 MED ORDER — ACCU-CHEK FASTCLIX LANCETS MISC: 11 refills | Status: DC

## 2020-11-27 NOTE — Progress Notes (Signed)
I,[name],acting as a Education administrator for [name], MD.,have documented all relevant documentation on the behalf of [name], MD,as directed by  [name], MD while in the presence of [name], MD.  This visit occurred during the SARS-CoV-2 public health emergency.  Safety protocols were in place, including screening questions prior to the visit, additional usage of staff PPE, and extensive cleaning of exam room while observing appropriate contact time as indicated for disinfecting solutions.  Subjective:     Patient ID: Sharon Mcdonald , female    DOB: 01/26/1956 , 65 y.o.   MRN: [id number]   Chief Complaint  Patient presents with  . Annual Exam  . Diabetes  . Hypertension    HPI  She is here today for a full physical examination. She is no longer followed by GYN for pelvic exams. She has no specific concerns or complaints at this time. She is back on her medications, states her sugars have improved. She is scheduled to see an endocrinologist in July.   She has yet to take her BP meds, takes at bedtime.   Diabetes She presents for her follow-up diabetic visit. She has type 2 diabetes mellitus. Her disease course has been stable. There are no hypoglycemic associated symptoms. Pertinent negatives for diabetes include no blurred vision and no chest pain. There are no hypoglycemic complications. Diabetic complications include nephropathy. Risk factors for coronary artery disease include diabetes mellitus, dyslipidemia, hypertension and post-menopausal. She is following a diabetic diet. She participates in exercise three times a week. Her breakfast blood glucose is taken between 7-8 am. Her breakfast blood glucose range is generally 90-110 mg/dl. An ACE inhibitor/angiotensin II receptor blocker is being taken. Eye exam is current.  Hypertension This is a chronic problem. The current episode started more than 1 year ago. The problem is unchanged. The problem is  controlled. Pertinent negatives include no blurred vision, chest pain, palpitations or shortness of breath. The current treatment provides moderate improvement. Compliance problems include exercise.  Hypertensive end-organ damage includes kidney disease.     Past Medical History:  Diagnosis Date  . Diabetes mellitus   . High cholesterol   . Hypertension      Family History  Problem Relation Age of Onset  . Diabetes Mother   . Hypertension Mother   . Obesity Mother   . Diabetes Brother      Current Outpatient Medications:  .  dorzolamide-timolol (COSOPT) 22.3-6.8 MG/ML ophthalmic solution, 1 drop 2 (two) times daily., Disp: , Rfl:  .  Empagliflozin-metFORMIN HCl ER (SYNJARDY XR) 12.11-998 MG TB24, Take 2 tablets by mouth daily., Disp: 60 tablet, Rfl: 5 .  gabapentin (NEURONTIN) 300 MG capsule, TAKE 1 CAPSULE BY MOUTH EVERY DAY, Disp: 90 capsule, Rfl: 2 .  LEVEMIR FLEXTOUCH 100 UNIT/ML FlexPen, INJECT 27 UNITS INTO THE SKIN AT BEDTIME, max titration dose 60 units, Disp: 15 mL, Rfl: 1 .  lisinopril (ZESTRIL) 20 MG tablet, TAKE 1 TABLET BY MOUTH EVERY DAY, Disp: 90 tablet, Rfl: 2 .  meclizine (ANTIVERT) 12.5 MG tablet, Take 1 tablet (12.5 mg total) by mouth 3 (three) times daily as needed for dizziness., Disp: 30 tablet, Rfl: 0 .  OZEMPIC, 1 MG/DOSE, 4 MG/3ML SOPN, INJECT 1 MG SUBCUTANEOUSLY EVERY WEEK ON SAME DAY OF EACH WEEK, IN ABDOMEN, TIGHS, OR UPPER ARM. ROTATING INJECTION SITES, Disp: 3 mL, Rfl: 1 .  pneumococcal 20-Val Conj Vacc (PREVNAR 20) 0.5 ML SUSY, Inject 0.5 mLs into the muscle tomorrow  at 10 am for 1 dose., Disp: 0.5 mL, Rfl: 0 .  pravastatin (PRAVACHOL) 20 MG tablet, TAKE 1 TABLET BY MOUTH EVERY DAY, Disp: 90 tablet, Rfl: 2 .  Accu-Chek FastClix Lancets MISC, Use to check blood sugars 2 times daily. Dx code:e11.65, Disp: 100 each, Rfl: 11 .  BD PEN NEEDLE NANO U/F 32G X 4 MM MISC, USE WITH INSULIN DAILY AND OZEMPIC ONCE WEEKLY, Disp: 100 each, Rfl: 5 .  Blood Glucose  Monitoring Suppl (ACCU-CHEK GUIDE ME) w/Device KIT, Use to check blood sugars 2 times daily. Dx code:e11.65, Disp: 1 kit, Rfl: 1 .  Continuous Blood Gluc Sensor (FREESTYLE LIBRE 14 DAY SENSOR) MISC, Use as directed to check blood sugars 4 times per day dx:e11.65 (Patient not taking: No sig reported), Disp: 2 each, Rfl: 2 .  glucose blood (ACCU-CHEK GUIDE) test strip, Use to check blood sugars 2 times daily. Dx code:e11.65, Disp: 100 each, Rfl: 11   No Known Allergies    The patient states she uses none for birth control. Last LMP was No LMP recorded. Patient is postmenopausal.. Negative for Dysmenorrhea. Negative for: breast discharge, breast lump(s), breast pain and breast self exam. Associated symptoms include abnormal vaginal bleeding. Pertinent negatives include abnormal bleeding (hematology), anxiety, decreased libido, depression, difficulty falling sleep, dyspareunia, history of infertility, nocturia, sexual dysfunction, sleep disturbances, urinary incontinence, urinary urgency, vaginal discharge and vaginal itching. Diet regular.The patient states her exercise level is  moderate.  . The patient's tobacco use is:  Social History   Tobacco Use  Smoking Status Former Smoker  . Types: Cigarettes  . Quit date: 09/07/1978  . Years since quitting: 42.2  Smokeless Tobacco Never Used  Tobacco Comment   1/2 pack per week  . She has been exposed to passive smoke. The patient's alcohol use is:  Social History   Substance and Sexual Activity  Alcohol Use No   Review of Systems  Constitutional: Negative.   HENT: Negative.   Eyes: Negative.  Negative for blurred vision.  Respiratory: Negative.  Negative for shortness of breath.   Cardiovascular: Negative.  Negative for chest pain and palpitations.  Gastrointestinal: Negative.   Endocrine: Negative.   Genitourinary: Negative.   Musculoskeletal: Negative.   Skin: Negative.   Allergic/Immunologic: Negative.   Neurological: Negative.    Hematological: Negative.   Psychiatric/Behavioral: Negative.      Today's Vitals   11/27/20 1412  BP: 140/82  Pulse: 93  Temp: 98.2 F (36.8 C)  TempSrc: Oral  Weight: 169 lb (76.7 kg)  Height: 5' (1.524 m)  PainSc: 0-No pain   Body mass index is 33.01 kg/m.  Wt Readings from Last 3 Encounters:  11/27/20 169 lb (76.7 kg)  10/30/20 160 lb 6.4 oz (72.8 kg)  10/24/20 162 lb 6.4 oz (73.7 kg)   BP Readings from Last 3 Encounters:  11/27/20 140/82  10/24/20 134/78  09/26/20 128/72   Objective:  Physical Exam Vitals and nursing note reviewed.  Constitutional:      Appearance: Normal appearance.  HENT:     Head: Normocephalic and atraumatic.     Right Ear: Tympanic membrane, ear canal and external ear normal.     Left Ear: Tympanic membrane, ear canal and external ear normal.     Nose:     Comments: Masked     Mouth/Throat:     Comments: Masked  Eyes:     Extraocular Movements: Extraocular movements intact.     Conjunctiva/sclera: Conjunctivae normal.     Pupils:  Pupils are equal, round, and reactive to light.  Cardiovascular:     Rate and Rhythm: Normal rate and regular rhythm.     Pulses: Normal pulses.          Dorsalis pedis pulses are 2+ on the right side and 2+ on the left side.     Heart sounds: Normal heart sounds.  Pulmonary:     Effort: Pulmonary effort is normal.     Breath sounds: Normal breath sounds.  Chest:  Breasts:     Tanner Score is 5.     Right: Normal.     Left: Normal.    Abdominal:     General: Bowel sounds are normal.     Palpations: Abdomen is soft.     Comments: Obese, soft  Genitourinary:    Comments: deferred Musculoskeletal:        General: Normal range of motion.     Cervical back: Normal range of motion and neck supple.  Feet:     Right foot:     Protective Sensation: 5 sites tested. 5 sites sensed.     Skin integrity: Dry skin present.     Toenail Condition: Right toenails are normal.     Left foot:     Protective  Sensation: 5 sites tested. 5 sites sensed.     Skin integrity: Dry skin present.     Toenail Condition: Left toenails are normal.  Skin:    General: Skin is warm and dry.  Neurological:     General: No focal deficit present.     Mental Status: She is alert and oriented to person, place, and time.  Psychiatric:        Mood and Affect: Mood normal.        Behavior: Behavior normal.      Assessment And Plan:     1. Routine general medical examination at health care facility Comments: A full exam was performed. Importance of monthly self breast exams was discussed with the patient. PATIENT IS ADVISED TO GET 30-45 MINUTES REGULAR EXERCISE NO LESS THAN FOUR TO FIVE DAYS PER WEEK - BOTH WEIGHTBEARING EXERCISES AND AEROBIC ARE RECOMMENDED.  PATIENT IS ADVISED TO FOLLOW A HEALTHY DIET WITH AT LEAST SIX FRUITS/VEGGIES PER DAY, DECREASE INTAKE OF RED MEAT, AND TO INCREASE FISH INTAKE TO TWO DAYS PER WEEK.  MEATS/FISH SHOULD NOT BE FRIED, BAKED OR BROILED IS PREFERABLE.  IT IS ALSO IMPORTANT TO CUT BACK ON YOUR SUGAR INTAKE. PLEASE AVOID ANYTHING WITH ADDED SUGAR, CORN SYRUP OR OTHER SWEETENERS. IF YOU MUST USE A SWEETENER, YOU CAN TRY STEVIA. IT IS ALSO IMPORTANT TO AVOID ARTIFICIALLY SWEETENERS AND DIET BEVERAGES. LASTLY, I SUGGEST WEARING SPF 50 SUNSCREEN ON EXPOSED PARTS AND ESPECIALLY WHEN IN THE DIRECT SUNLIGHT FOR AN EXTENDED PERIOD OF TIME.  PLEASE AVOID FAST FOOD RESTAURANTS AND INCREASE YOUR WATER INTAKE.  2. Uncontrolled type 2 diabetes mellitus with hyperglycemia ([place]) Comments: Diabetic foot exam was performed.  I will request her most recent eye exam. I will call [facility] to request female provider.  I DISCUSSED WITH THE PATIENT AT LENGTH REGARDING THE GOALS OF GLYCEMIC CONTROL AND POSSIBLE LONG-TERM COMPLICATIONS.  I  ALSO STRESSED THE IMPORTANCE OF COMPLIANCE WITH HOME GLUCOSE MONITORING, DIETARY RESTRICTIONS INCLUDING AVOIDANCE OF SUGARY DRINKS/PROCESSED FOODS,  ALONG WITH REGULAR EXERCISE.  I   ALSO STRESSED THE IMPORTANCE OF ANNUAL EYE EXAMS, SELF FOOT CARE AND COMPLIANCE WITH OFFICE VISITS.   - POCT Urinalysis Dipstick (81002) - POCT UA - Microalbumin  3. Parenchymal renal hypertension, stage 1 through stage 4 or unspecified chronic kidney disease Comments: Chronic, fair control. EKG performed, NSR w/o acute changes. Advised to follow low sodium diet and take meds with evening meal. She will f/u in July 2022.  - EKG 12-Lead  4. Estrogen deficiency Comments: I will refer her for bone density. I will refer her to the [facility].   5. Abnormal urine Urine pos for nitrite and leukocytes. I will send off culture. She is currently asymptomatic.  - Culture, Urine  6. Class 1 obesity due to excess calories with serious comorbidity and body mass index (BMI) of 33.0 to 33.9 in adult Comments: She is encouraged to strive for BMI less than 30 to decrease cardiac risk. Advised to aim for at least 150 minutes of exercise per week.  7. Encounter for long-term (current) insulin use ([place])  8. Immunization due Comments: I will send rx [id number] to your local pharmacy. She declines Shingrix at this time.   Patient was given opportunity to ask questions. Patient verbalized understanding of the plan and was able to repeat key elements of the plan. All questions were answered to their satisfaction.   I, [name], MD, have reviewed all documentation for this visit. The documentation on 11/27/20 for the exam, diagnosis, procedures, and orders are all accurate and complete.  THE PATIENT IS ENCOURAGED TO PRACTICE SOCIAL DISTANCING DUE TO THE COVID-19 PANDEMIC.

## 2020-11-27 NOTE — Patient Instructions (Signed)
Health Maintenance, Female Adopting a healthy lifestyle and getting preventive care are important in promoting health and wellness. Ask your health care provider about:  The right schedule for you to have regular tests and exams.  Things you can do on your own to prevent diseases and keep yourself healthy. What should I know about diet, weight, and exercise? Eat a healthy diet  Eat a diet that includes plenty of vegetables, fruits, low-fat dairy products, and lean protein.  Do not eat a lot of foods that are high in solid fats, added sugars, or sodium.   Maintain a healthy weight Body mass index (BMI) is used to identify weight problems. It estimates body fat based on height and weight. Your health care provider can help determine your BMI and help you achieve or maintain a healthy weight. Get regular exercise Get regular exercise. This is one of the most important things you can do for your health. Most adults should:  Exercise for at least 150 minutes each week. The exercise should increase your heart rate and make you sweat (moderate-intensity exercise).  Do strengthening exercises at least twice a week. This is in addition to the moderate-intensity exercise.  Spend less time sitting. Even light physical activity can be beneficial. Watch cholesterol and blood lipids Have your blood tested for lipids and cholesterol at 65 years of age, then have this test every 5 years. Have your cholesterol levels checked more often if:  Your lipid or cholesterol levels are high.  You are older than 65 years of age.  You are at high risk for heart disease. What should I know about cancer screening? Depending on your health history and family history, you may need to have cancer screening at various ages. This may include screening for:  Breast cancer.  Cervical cancer.  Colorectal cancer.  Skin cancer.  Lung cancer. What should I know about heart disease, diabetes, and high blood  pressure? Blood pressure and heart disease  High blood pressure causes heart disease and increases the risk of stroke. This is more likely to develop in people who have high blood pressure readings, are of African descent, or are overweight.  Have your blood pressure checked: ? Every 3-5 years if you are 18-39 years of age. ? Every year if you are 40 years old or older. Diabetes Have regular diabetes screenings. This checks your fasting blood sugar level. Have the screening done:  Once every three years after age 40 if you are at a normal weight and have a low risk for diabetes.  More often and at a younger age if you are overweight or have a high risk for diabetes. What should I know about preventing infection? Hepatitis B If you have a higher risk for hepatitis B, you should be screened for this virus. Talk with your health care provider to find out if you are at risk for hepatitis B infection. Hepatitis C Testing is recommended for:  Everyone born from 1945 through 1965.  Anyone with known risk factors for hepatitis C. Sexually transmitted infections (STIs)  Get screened for STIs, including gonorrhea and chlamydia, if: ? You are sexually active and are younger than 65 years of age. ? You are older than 65 years of age and your health care provider tells you that you are at risk for this type of infection. ? Your sexual activity has changed since you were last screened, and you are at increased risk for chlamydia or gonorrhea. Ask your health care provider   if you are at risk.  Ask your health care provider about whether you are at high risk for HIV. Your health care provider may recommend a prescription medicine to help prevent HIV infection. If you choose to take medicine to prevent HIV, you should first get tested for HIV. You should then be tested every 3 months for as long as you are taking the medicine. Pregnancy  If you are about to stop having your period (premenopausal) and  you may become pregnant, seek counseling before you get pregnant.  Take 400 to 800 micrograms (mcg) of folic acid every day if you become pregnant.  Ask for birth control (contraception) if you want to prevent pregnancy. Osteoporosis and menopause Osteoporosis is a disease in which the bones lose minerals and strength with aging. This can result in bone fractures. If you are 65 years old or older, or if you are at risk for osteoporosis and fractures, ask your health care provider if you should:  Be screened for bone loss.  Take a calcium or vitamin D supplement to lower your risk of fractures.  Be given hormone replacement therapy (HRT) to treat symptoms of menopause. Follow these instructions at home: Lifestyle  Do not use any products that contain nicotine or tobacco, such as cigarettes, e-cigarettes, and chewing tobacco. If you need help quitting, ask your health care provider.  Do not use street drugs.  Do not share needles.  Ask your health care provider for help if you need support or information about quitting drugs. Alcohol use  Do not drink alcohol if: ? Your health care provider tells you not to drink. ? You are pregnant, may be pregnant, or are planning to become pregnant.  If you drink alcohol: ? Limit how much you use to 0-1 drink a day. ? Limit intake if you are breastfeeding.  Be aware of how much alcohol is in your drink. In the [place] one drink equals one 12 oz bottle of beer (355 mL), one 5 oz glass of wine (148 mL), or one 1 oz glass of hard liquor (44 mL). General instructions  Schedule regular health, dental, and eye exams.  Stay current with your vaccines.  Tell your health care provider if: ? You often feel depressed. ? You have ever been abused or do not feel safe at home. Summary  Adopting a healthy lifestyle and getting preventive care are important in promoting health and wellness.  Follow your health care provider's instructions about healthy  diet, exercising, and getting tested or screened for diseases.  Follow your health care provider's instructions on monitoring your cholesterol and blood pressure. This information is not intended to replace advice given to you by your health care provider. Make sure you discuss any questions you have with your health care provider. Document Revised: 06/17/2018 Document Reviewed: 06/17/2018 Elsevier Patient Education  2021 Elsevier Inc.  

## 2020-11-29 ENCOUNTER — Other Ambulatory Visit: Payer: Self-pay | Admitting: Internal Medicine

## 2020-11-30 ENCOUNTER — Other Ambulatory Visit: Payer: Self-pay | Admitting: Internal Medicine

## 2020-11-30 ENCOUNTER — Encounter: Payer: Self-pay | Admitting: Internal Medicine

## 2020-11-30 LAB — URINE CULTURE

## 2020-11-30 MED ORDER — NITROFURANTOIN MONOHYD MACRO 100 MG PO CAPS: 100.0000 mg | ORAL_CAPSULE | Freq: Two times a day (BID) | ORAL | 0 refills | Status: AC

## 2020-12-08 ENCOUNTER — Encounter: Payer: Self-pay | Admitting: Internal Medicine

## 2020-12-11 ENCOUNTER — Encounter: Payer: Self-pay | Admitting: Internal Medicine

## 2020-12-11 ENCOUNTER — Other Ambulatory Visit: Payer: Self-pay | Admitting: Internal Medicine

## 2020-12-11 ENCOUNTER — Other Ambulatory Visit: Payer: Medicare PPO

## 2020-12-11 DIAGNOSIS — Z111 Encounter for screening for respiratory tuberculosis: Secondary | ICD-10-CM

## 2020-12-13 LAB — QUANTIFERON-TB GOLD PLUS
QuantiFERON Mitogen Value: >10 IU/mL
QuantiFERON Nil Value: 0.01 IU/mL
QuantiFERON TB1 Ag Value: 0.03 IU/mL
QuantiFERON TB2 Ag Value: 0.03 IU/mL
QuantiFERON-TB Gold Plus: NEGATIVE

## 2020-12-15 ENCOUNTER — Other Ambulatory Visit: Payer: Self-pay | Admitting: Internal Medicine

## 2020-12-27 DIAGNOSIS — E113513 Type 2 diabetes mellitus with proliferative diabetic retinopathy with macular edema, bilateral: Secondary | ICD-10-CM | POA: Diagnosis not present

## 2020-12-27 DIAGNOSIS — H3582 Retinal ischemia: Secondary | ICD-10-CM | POA: Diagnosis not present

## 2021-01-04 ENCOUNTER — Encounter: Payer: Self-pay | Admitting: Internal Medicine

## 2021-01-05 ENCOUNTER — Ambulatory Visit: Payer: Self-pay | Admitting: Endocrinology

## 2021-01-07 ENCOUNTER — Other Ambulatory Visit: Payer: Self-pay | Admitting: Internal Medicine

## 2021-01-09 ENCOUNTER — Ambulatory Visit: Payer: Medicare PPO | Admitting: Internal Medicine

## 2021-01-09 ENCOUNTER — Other Ambulatory Visit: Payer: Self-pay

## 2021-01-09 ENCOUNTER — Encounter: Payer: Self-pay | Admitting: Internal Medicine

## 2021-01-09 VITALS — BP 132/86 | HR 90 | Temp 98.1°F | Ht 61.0 in | Wt 162.2 lb

## 2021-01-09 DIAGNOSIS — E2839 Other primary ovarian failure: Secondary | ICD-10-CM | POA: Diagnosis not present

## 2021-01-09 DIAGNOSIS — Z6830 Body mass index (BMI) 30.0-30.9, adult: Secondary | ICD-10-CM

## 2021-01-09 DIAGNOSIS — I129 Hypertensive chronic kidney disease with stage 1 through stage 4 chronic kidney disease, or unspecified chronic kidney disease: Secondary | ICD-10-CM | POA: Diagnosis not present

## 2021-01-09 DIAGNOSIS — E1165 Type 2 diabetes mellitus with hyperglycemia: Secondary | ICD-10-CM | POA: Insufficient documentation

## 2021-01-09 DIAGNOSIS — E6609 Other obesity due to excess calories: Secondary | ICD-10-CM | POA: Diagnosis not present

## 2021-01-09 DIAGNOSIS — Z79899 Other long term (current) drug therapy: Secondary | ICD-10-CM | POA: Diagnosis not present

## 2021-01-09 DIAGNOSIS — E538 Deficiency of other specified B group vitamins: Secondary | ICD-10-CM | POA: Diagnosis not present

## 2021-01-09 MED ORDER — CYANOCOBALAMIN 1000 MCG/ML IJ SOLN
1000.0000 ug | Freq: Once | INTRAMUSCULAR | Status: AC
  Administered 2021-01-09: 1000 ug via INTRAMUSCULAR

## 2021-01-09 NOTE — Progress Notes (Signed)
I,Sharon Mcdonald,acting as a Education administrator for Sharon Greenland, MD.,have documented all relevant documentation on the behalf of Sharon Greenland, MD,as directed by  Sharon Greenland, MD while in the presence of Sharon Greenland, MD.  This visit occurred during the SARS-CoV-2 public health emergency.  Safety protocols were in place, including screening questions prior to the visit, additional usage of staff PPE, and extensive cleaning of exam room while observing appropriate contact time as indicated for disinfecting solutions.  Subjective:     Patient ID: Sharon Mcdonald , female    DOB: 08-21-1955 , 65 y.o.   MRN: [id number]   Chief Complaint  Patient presents with   Diabetes   Hypertension    HPI  She presents today for DM and HTN f/u. She is now taking her meds as prescribed. She adds that she has been walking daily as well. She denies headaches, chest pain and shortness of breath.   Diabetes She presents for her follow-up diabetic visit. She has type 2 diabetes mellitus. Her disease course has been stable. There are no hypoglycemic associated symptoms. Pertinent negatives for diabetes include no blurred vision and no chest pain. There are no hypoglycemic complications. Diabetic complications include nephropathy. Risk factors for coronary artery disease include diabetes mellitus, dyslipidemia, hypertension and post-menopausal. She is following a diabetic diet. She participates in exercise three times a week. Her breakfast blood glucose is taken between 7-8 am. Her breakfast blood glucose range is generally 90-110 mg/dl. An ACE inhibitor/angiotensin II receptor blocker is being taken. Eye exam is current.  Hypertension This is a chronic problem. The current episode started more than 1 year ago. The problem is unchanged. The problem is controlled. Pertinent negatives include no blurred vision, chest pain, palpitations or shortness of breath.    Past Medical History:  Diagnosis Date   Diabetes  mellitus    High cholesterol    Hypertension      Family History  Problem Relation Age of Onset   Diabetes Mother    Hypertension Mother    Obesity Mother    Diabetes Brother      Current Outpatient Medications:    Accu-Chek FastClix Lancets MISC, Use to check blood sugars 2 times daily. Dx code:e11.65, Disp: 100 each, Rfl: 11   BD PEN NEEDLE NANO 2ND GEN 32G X 4 MM MISC, USE WITH INSULIN DAILY AND OZEMPIC ONCE WEEKLY, Disp: 100 each, Rfl: 5   Blood Glucose Monitoring Suppl (ACCU-CHEK GUIDE ME) w/Device KIT, Use to check blood sugars 2 times daily. Dx code:e11.65, Disp: 1 kit, Rfl: 1   Continuous Blood Gluc Sensor (FREESTYLE LIBRE 14 DAY SENSOR) MISC, Use as directed to check blood sugars 4 times per day dx:e11.65 (Patient not taking: No sig reported), Disp: 2 each, Rfl: 2   dorzolamide-timolol (COSOPT) 22.3-6.8 MG/ML ophthalmic solution, 1 drop 2 (two) times daily., Disp: , Rfl:    Empagliflozin-metFORMIN HCl ER (SYNJARDY XR) 12.11-998 MG TB24, Take 2 tablets by mouth daily., Disp: 60 tablet, Rfl: 5   gabapentin (NEURONTIN) 300 MG capsule, TAKE 1 CAPSULE BY MOUTH EVERY DAY, Disp: 90 capsule, Rfl: 2   glucose blood (ACCU-CHEK GUIDE) test strip, Use to check blood sugars 2 times daily. Dx code:e11.65, Disp: 100 each, Rfl: 11   LEVEMIR FLEXTOUCH 100 UNIT/ML FlexPen, INJECT 27 UNITS INTO THE SKIN AT BEDTIME, max titration dose 60 units, Disp: 15 mL, Rfl: 1   lisinopril (ZESTRIL) 20 MG tablet, TAKE 1 TABLET BY MOUTH EVERY DAY, Disp: 90 tablet,  Rfl: 2   meclizine (ANTIVERT) 12.5 MG tablet, Take 1 tablet (12.5 mg total) by mouth 3 (three) times daily as needed for dizziness., Disp: 30 tablet, Rfl: 0   OZEMPIC, 1 MG/DOSE, 4 MG/3ML SOPN, INJECT 1 MG SUBCUTANEOUSLY EVERY WEEK ON SAME DAY OF EACH WEEK, IN ABDOMEN, TIGHS, OR UPPER ARM. ROTATING INJECTION SITES, Disp: 9 mL, Rfl: 1   pravastatin (PRAVACHOL) 20 MG tablet, TAKE 1 TABLET BY MOUTH EVERY DAY, Disp: 90 tablet, Rfl: 2   No Known Allergies    Review of Systems  Constitutional: Negative.   Eyes:  Negative for blurred vision.  Respiratory: Negative.  Negative for shortness of breath.   Cardiovascular: Negative.  Negative for chest pain and palpitations.  Gastrointestinal: Negative.   Psychiatric/Behavioral: Negative.    All other systems reviewed and are negative.   Today's Vitals   01/09/21 1208  BP: 132/86  Pulse: 90  Temp: 98.1 F (36.7 C)  TempSrc: Oral  Weight: 162 lb 3.2 oz (73.6 kg)  Height: '5\' 1"'  (1.549 m)  PainSc: 0-No pain   Body mass index is 30.65 kg/m.  Wt Readings from Last 3 Encounters:  01/09/21 162 lb 3.2 oz (73.6 kg)  11/27/20 169 lb (76.7 kg)  10/30/20 160 lb 6.4 oz (72.8 kg)    BP Readings from Last 3 Encounters:  01/09/21 132/86  11/27/20 140/82  10/24/20 134/78    Objective:  Physical Exam Vitals and nursing note reviewed.  Constitutional:      Appearance: Normal appearance.  HENT:     Head: Normocephalic and atraumatic.     Nose:     Comments: Masked     Mouth/Throat:     Comments: Masked  Cardiovascular:     Rate and Rhythm: Normal rate and regular rhythm.     Heart sounds: Normal heart sounds.  Pulmonary:     Effort: Pulmonary effort is normal.     Breath sounds: Normal breath sounds.  Musculoskeletal:     Cervical back: Normal range of motion.  Skin:    General: Skin is warm.  Neurological:     General: No focal deficit present.     Mental Status: She is alert.  Psychiatric:        Mood and Affect: Mood normal.        Behavior: Behavior normal.        Assessment And Plan:     1. Uncontrolled type 2 diabetes mellitus with hyperglycemia ([place]) Comments: I will check labs as listed below. I will adjust meds as needed. I anticipate increasing Levemir to 30 units daily. She is considering seeing MD in [place] that does advanced testing for diabetes. She is encouraged to bring in test results for review at her next visit (if she establishes care with this provider).  -  BMP8+EGFR - Hemoglobin A1c  2. Parenchymal renal hypertension, stage 1 through stage 4 or unspecified chronic kidney disease Comments: Chronic, fair control. She is encouraged to follow low sodium diet. She is on Ace-inhibitor therapy.  3. Estrogen deficiency Comments: I will refer her for bone density. Encouraged to engage in weight-bearing exercises 3-5 days per week.  - DG Bone Density; Future  4. Vitamin B12 deficiency Comments: I will check vitamin B12 level. She was also given vitamin B12 IM x 1. I will determine treatment schedule based on her lab results.  - cyanocobalamin ((VITAMIN B-12)) injection 1,000 mcg  5. Class 1 obesity due to excess calories with serious comorbidity and body mass  index (BMI) of 30.0 to 30.9 in adult Comments: She was congratulated on her 7 pound weight loss since May 2022. She is encouraged to keep up the great work!  6. Drug therapy - Vitamin B12    Patient was given opportunity to ask questions. Patient verbalized understanding of the plan and was able to repeat key elements of the plan. All questions were answered to their satisfaction.   I, Sharon Greenland, MD, have reviewed all documentation for this visit. The documentation on 01/09/21 for the exam, diagnosis, procedures, and orders are all accurate and complete.   IF YOU HAVE BEEN REFERRED TO A SPECIALIST, IT MAY TAKE 1-2 WEEKS TO SCHEDULE/PROCESS THE REFERRAL. IF YOU HAVE NOT HEARD FROM US/SPECIALIST IN TWO WEEKS, PLEASE GIVE [place] A CALL AT [phone] X [phone].   THE PATIENT IS ENCOURAGED TO PRACTICE SOCIAL DISTANCING DUE TO THE COVID-19 PANDEMIC.

## 2021-01-10 LAB — HEMOGLOBIN A1C
Est. average glucose Bld gHb Est-mCnc: 240 mg/dL
Hgb A1c MFr Bld: 10 % — ABNORMAL HIGH (ref 4.8–5.6)

## 2021-01-10 LAB — BMP8+EGFR
BUN/Creatinine Ratio: 15 (ref 12–28)
BUN: 11 mg/dL (ref 8–27)
CO2: 22 mmol/L (ref 20–29)
Calcium: 9.6 mg/dL (ref 8.7–10.3)
Chloride: 99 mmol/L (ref 96–106)
Creatinine, Ser: 0.72 mg/dL (ref 0.57–1.00)
Glucose: 283 mg/dL — ABNORMAL HIGH (ref 65–99)
Potassium: 4.5 mmol/L (ref 3.5–5.2)
Sodium: 136 mmol/L (ref 134–144)
eGFR: 93 mL/min/{1.73_m2} (ref >59)

## 2021-01-10 LAB — VITAMIN B12: Vitamin B-12: 631 pg/mL (ref 232–1245)

## 2021-01-26 DIAGNOSIS — E113511 Type 2 diabetes mellitus with proliferative diabetic retinopathy with macular edema, right eye: Secondary | ICD-10-CM | POA: Diagnosis not present

## 2021-02-05 DIAGNOSIS — Z794 Long term (current) use of insulin: Secondary | ICD-10-CM | POA: Diagnosis not present

## 2021-02-05 DIAGNOSIS — Z961 Presence of intraocular lens: Secondary | ICD-10-CM | POA: Diagnosis not present

## 2021-02-05 DIAGNOSIS — H401131 Primary open-angle glaucoma, bilateral, mild stage: Secondary | ICD-10-CM | POA: Diagnosis not present

## 2021-02-05 DIAGNOSIS — E113553 Type 2 diabetes mellitus with stable proliferative diabetic retinopathy, bilateral: Secondary | ICD-10-CM | POA: Diagnosis not present

## 2021-02-14 ENCOUNTER — Other Ambulatory Visit: Payer: Self-pay

## 2021-02-14 ENCOUNTER — Ambulatory Visit: Payer: Medicare PPO | Admitting: Internal Medicine

## 2021-02-28 DIAGNOSIS — E113513 Type 2 diabetes mellitus with proliferative diabetic retinopathy with macular edema, bilateral: Secondary | ICD-10-CM | POA: Diagnosis not present

## 2021-02-28 DIAGNOSIS — H3582 Retinal ischemia: Secondary | ICD-10-CM | POA: Diagnosis not present

## 2021-02-28 DIAGNOSIS — H43813 Vitreous degeneration, bilateral: Secondary | ICD-10-CM | POA: Diagnosis not present

## 2021-03-09 ENCOUNTER — Encounter: Payer: Self-pay | Admitting: Internal Medicine

## 2021-03-09 ENCOUNTER — Other Ambulatory Visit: Payer: Self-pay

## 2021-03-09 ENCOUNTER — Ambulatory Visit (INDEPENDENT_AMBULATORY_CARE_PROVIDER_SITE_OTHER): Payer: Medicare PPO | Admitting: Internal Medicine

## 2021-03-09 VITALS — BP 144/96 | HR 88 | Ht 61.0 in | Wt 162.8 lb

## 2021-03-09 DIAGNOSIS — Z794 Long term (current) use of insulin: Secondary | ICD-10-CM

## 2021-03-09 DIAGNOSIS — R739 Hyperglycemia, unspecified: Secondary | ICD-10-CM | POA: Diagnosis not present

## 2021-03-09 DIAGNOSIS — E1165 Type 2 diabetes mellitus with hyperglycemia: Secondary | ICD-10-CM

## 2021-03-09 DIAGNOSIS — E113593 Type 2 diabetes mellitus with proliferative diabetic retinopathy without macular edema, bilateral: Secondary | ICD-10-CM

## 2021-03-09 LAB — GLUCOSE, POCT (MANUAL RESULT ENTRY): POC Glucose: 278 mg/dl — AB (ref 70–99)

## 2021-03-09 MED ORDER — SYNJARDY XR 12.5-1000 MG PO TB24: 2.0000 | ORAL_TABLET | Freq: Every day | ORAL | 3 refills | Status: DC

## 2021-03-09 MED ORDER — TRESIBA FLEXTOUCH 100 UNIT/ML ~~LOC~~ SOPN: 20.0000 [IU] | PEN_INJECTOR | Freq: Every day | SUBCUTANEOUS | 6 refills | Status: DC

## 2021-03-09 MED ORDER — ONETOUCH VERIO VI STRP: 1.0000 | ORAL_STRIP | Freq: Two times a day (BID) | 3 refills | Status: DC

## 2021-03-09 MED ORDER — INSULIN PEN NEEDLE 32G X 4 MM MISC: 1.0000 | Freq: Every day | 3 refills | Status: DC

## 2021-03-09 MED ORDER — DEXCOM G6 TRANSMITTER MISC: 1.0000 | 3 refills | Status: DC

## 2021-03-09 MED ORDER — DEXCOM G6 SENSOR MISC: 1.0000 | 3 refills | Status: DC

## 2021-03-09 NOTE — Progress Notes (Signed)
Name: Sharon Mcdonald  MRN/ DOB: [id number], 1956-04-06   Age/ Sex: 65 y.o., female    PCP: Glendale Chard, MD   Reason for Endocrinology Evaluation: Type 2 Diabetes Mellitus     Date of Initial Endocrinology Visit: 03/09/2021     PATIENT IDENTIFIER: [name] is a 65 y.o. female with a past medical history of T2DM, HTN, and dyslipidemia. The patient presented for initial endocrinology clinic visit on 03/09/2021 for consultative assistance with her diabetes management.      HPI: Ms. Cimino was    Diagnosed with DM in 2003 Prior Medications tried/Intolerance: She was on Ozempic, basal insulin, and Synjardy but stopped without reported intolerance Currently checking blood sugars none, stopped due to frustaration Hypoglycemia episodes : no           Hemoglobin A1c has ranged from 6.3% in 2021, peaking at 12.3% in 2022. Patient required assistance for hypoglycemia: no  Patient has required hospitalization within the last 1 year from hyper or hypoglycemia: no   In terms of diet, the patient eats 2 meals, snacks on fruits . Drinks occasional sweet drinks   She is exercising  Unable to lose weight  She was having stool incontinence but this has improved   Denies nausea or diarrhea     Mother and Brother with DM  Stopped medications a month ago   Lives with husbands, retired Optometrist at [employer]: Synjardy 12.11-998  Ozempic 1 mg daily  Levemir    Statin: yes ACE-I/ARB: yes Prior Diabetic Education: yes    METER DOWNLOAD SUMMARY: Did not bing   DIABETIC COMPLICATIONS: Microvascular complications:   retinopathy (receives injection bilaterally ) Denies: CKD Last eye exam: Completed 01/2021 Dr. Katy Fitch   Macrovascular complications:   Denies: CAD, PVD, CVA   PAST HISTORY: Past Medical History:  Past Medical History:  Diagnosis Date   Diabetes mellitus    High cholesterol    Hypertension    Past Surgical  History:  Past Surgical History:  Procedure Laterality Date   BREAST CYST EXCISION Left    BREAST SURGERY     REDUCTION MAMMAPLASTY Bilateral    TONSILLECTOMY     TUBAL LIGATION      Social History:  reports that she quit smoking about 42 years ago. Her smoking use included cigarettes. She has never used smokeless tobacco. She reports that she does not drink alcohol and does not use drugs. Family History:  Family History  Problem Relation Age of Onset   Diabetes Mother    Hypertension Mother    Obesity Mother    Diabetes Brother      HOME MEDICATIONS: Allergies as of 03/09/2021   No Known Allergies      Medication List        Accurate as of March 09, 2021 11:52 AM. If you have any questions, ask your nurse or doctor.          Accu-Chek FastClix Lancets Misc Use to check blood sugars 2 times daily. Dx code:e11.65   Accu-Chek Guide Me w/Device Kit Use to check blood sugars 2 times daily. Dx code:e11.65   Accu-Chek Guide test strip Generic drug: glucose blood Use to check blood sugars 2 times daily. Dx code:e11.65   BD Pen Needle Nano 2nd Gen 32G X 4 MM Misc Generic drug: Insulin Pen Needle USE WITH INSULIN DAILY AND OZEMPIC ONCE WEEKLY   dorzolamide-timolol 22.3-6.8 MG/ML ophthalmic solution Commonly known as: COSOPT  1 drop 2 (two) times daily.   FreeStyle Libre 14 Day Sensor Misc Use as directed to check blood sugars 4 times per day dx:e11.65   gabapentin 300 MG capsule Commonly known as: NEURONTIN TAKE 1 CAPSULE BY MOUTH EVERY DAY   Levemir FlexTouch 100 UNIT/ML FlexPen Generic drug: insulin detemir INJECT 27 UNITS INTO THE SKIN AT BEDTIME, max titration dose 60 units   lisinopril 20 MG tablet Commonly known as: ZESTRIL TAKE 1 TABLET BY MOUTH EVERY DAY   meclizine 12.5 MG tablet Commonly known as: ANTIVERT Take 1 tablet (12.5 mg total) by mouth 3 (three) times daily as needed for dizziness.   Ozempic (1 MG/DOSE) 4 MG/3ML Sopn Generic drug:  Semaglutide (1 MG/DOSE) INJECT 1 MG SUBCUTANEOUSLY EVERY WEEK ON SAME DAY OF EACH WEEK, IN ABDOMEN, TIGHS, OR UPPER ARM. ROTATING INJECTION SITES   pravastatin 20 MG tablet Commonly known as: PRAVACHOL TAKE 1 TABLET BY MOUTH EVERY DAY   Synjardy XR 12.11-998 MG Tb24 Generic drug: Empagliflozin-metFORMIN HCl ER TAKE 2 TABLETS BY MOUTH EVERY DAY         ALLERGIES: No Known Allergies   REVIEW OF SYSTEMS: A comprehensive ROS was conducted with the patient and is negative except as per HPI     OBJECTIVE:   VITAL SIGNS: BP (!) 144/96 (BP Location: Left Arm, Patient Position: Sitting, Cuff Size: Small)   Pulse 88   Ht _0  (1.549 m)   Wt 162 lb 12.8 oz (73.8 kg)   SpO2 98%   BMI 30.76 kg/m    PHYSICAL EXAM:  General: Pt appears well and is in NAD  Neck: General: Supple without adenopathy or carotid bruits. Thyroid: Thyroid size normal.  No goiter or nodules appreciated.   Lungs: Clear with good BS bilat with no rales, rhonchi, or wheezes  Heart: RRR with normal S1 and S2 and no gallops; no murmurs; no rub  Abdomen: Normoactive bowel sounds, soft, nontender, without masses or organomegaly palpable  Extremities:  Lower extremities - No pretibial edema. No lesions.  Skin: Normal texture and temperature to palpation. No rash noted. No Acanthosis nigricans/skin tags. No lipohypertrophy.  Neuro: MS is good with appropriate affect, pt is alert and Ox3    DM foot exam: 03/09/2021  The skin of the feet is intact without sores or ulcerations. The pedal pulses are 2+ on right and 2+ on left. The sensation is intact to a screening 5.07, 10 gram monofilament bilaterally   DATA REVIEWED:  Lab Results  Component Value Date   HGBA1C 10.0 (H) 01/09/2021   HGBA1C 12.3 (H) 10/30/2020   HGBA1C 7.6 (H) 07/27/2020   Lab Results  Component Value Date   MICROALBUR 80 11/27/2020   LDLCALC 109 (H) 07/27/2020   CREATININE 0.72 01/09/2021   Lab Results  Component Value Date    MICRALBCREAT 300 11/27/2020    Lab Results  Component Value Date   CHOL 180 07/27/2020   HDL 55 07/27/2020   LDLCALC 109 (H) 07/27/2020   TRIG 88 07/27/2020   CHOLHDL 3.3 07/27/2020        ASSESSMENT / PLAN / RECOMMENDATIONS:   1) Type 2 Diabetes Mellitus, Poorly controlled, With retinopathic complications - Most recent A1c of 10.0%. Goal A1c < 7.0 %.    Plan: GENERAL: I have discussed with the patient the pathophysiology of diabetes. We went over the natural progression of the disease. We talked about both insulin resistance and insulin deficiency. We stressed the importance of lifestyle changes including diet  and exercise. I explained the complications associated with diabetes including retinopathy, nephropathy, neuropathy as well as increased risk of cardiovascular disease. We went over the benefit seen with glycemic control.  I explained to the patient that diabetic patients are at higher than normal risk for amputations.  Poorly controlled diabetes due to imperfect adherence to medications Her main barriers to diabetes self-care is diabetes related anxiety We discussed the importance of low-carb diet and consistency with medication intake We discussed variable glycemic agents as well as mechanism of action. She has agreed to restart basal insulin as well as Synjardy She was given a glucose meter today and was shown how to use it by my assistant I am going to prescribe Dexcom I am not going to start Ozempic at this time due to reported stool incontinence  MEDICATIONS: Restart Synjardy 12.11-998 mg 2 tabs every morning Start Tresiba 20 units daily  EDUCATION / INSTRUCTIONS: BG monitoring instructions: Patient is instructed to check glucose once a day I reviewed the Rule of 15 for the treatment of hypoglycemia in detail with the patient. Literature supplied.   2) Diabetic complications:  Eye: Does  have known diabetic retinopathy.  Neuro/ Feet: Does not have known diabetic  peripheral neuropathy. Renal: Patient does not have known baseline CKD. She is  on an ACEI/ARB at present.   3) Dyslipidemia:  - LDL above goal , she has not been on statin therapy -We discussed cardiovascular benefits of statins to reduce risk of strokes in future MIs    I spent 45 minutes preparing to see the patient by review of recent labs, imaging and procedures, obtaining and reviewing separately obtained history, communicating with the patient/family or caregiver, ordering medications, tests or procedures, and documenting clinical information in the EHR including the differential Dx, treatment, and any further evaluation and other management     Signed electronically by: Mack Guise, MD  [facility] Endocrinology  [facility] [address] [place],  [address] Phone: [phone] FAX: [phone]   CC: Glendale Chard, [address] [place] [address] Phone: [phone]  Fax: [phone]    Return to Endocrinology clinic as below: Future Appointments  Date Time Provider [place]  04/09/2021  9:45 AM Glendale Chard, MD [facility] None  07/13/2021  3:30 PM GI-[facility]G DX DEXA 1 GI-BCGDG GI-BREAST CE  07/26/2021 10:00 AM [facility]N [facility] None  07/26/2021 11:00 AM Glendale Chard, MD [facility] None  11/29/2021 11:00 AM Glendale Chard, MD [facility] None

## 2021-03-09 NOTE — Patient Instructions (Addendum)
-   Restart Synjardy 12.11-998 mg , ONE tablet with Breakfast for 1 week , then increase to TWO tablets with breakfast  - Start Tresiba 20 units daily     Choose healthy, lower carb lower calorie snacks: toss salad, vegetables, cottage cheese, peanut butter, low fat cheese / string cheese, lower sodium deli meat, tuna salad or chicken salad   HOW TO TREAT LOW BLOOD SUGARS (Blood sugar LESS THAN 70 MG/DL) Please follow the RULE OF 15 for the treatment of hypoglycemia treatment (when your (blood sugars are less than 70 mg/dL)   STEP 1: Take 15 grams of carbohydrates when your blood sugar is low, which includes:  3-4 GLUCOSE TABS  OR 3-4 OZ OF JUICE OR REGULAR SODA OR ONE TUBE OF GLUCOSE GEL    STEP 2: RECHECK blood sugar in 15 MINUTES STEP 3: If your blood sugar is still low at the 15 minute recheck --> then, go back to STEP 1 and treat AGAIN with another 15 grams of carbohydrates.

## 2021-04-09 ENCOUNTER — Ambulatory Visit: Payer: Medicare PPO | Admitting: Internal Medicine

## 2021-04-25 DIAGNOSIS — E113513 Type 2 diabetes mellitus with proliferative diabetic retinopathy with macular edema, bilateral: Secondary | ICD-10-CM | POA: Diagnosis not present

## 2021-05-08 ENCOUNTER — Other Ambulatory Visit: Payer: Self-pay

## 2021-05-08 MED ORDER — SYNJARDY XR 12.5-1000 MG PO TB24: 2.0000 | ORAL_TABLET | Freq: Every day | ORAL | 1 refills | Status: DC

## 2021-05-08 MED ORDER — PRAVASTATIN SODIUM 20 MG PO TABS: 20.0000 mg | ORAL_TABLET | Freq: Every day | ORAL | 1 refills | Status: DC

## 2021-05-08 MED ORDER — TRESIBA FLEXTOUCH 100 UNIT/ML ~~LOC~~ SOPN: 20.0000 [IU] | PEN_INJECTOR | Freq: Every day | SUBCUTANEOUS | 6 refills | Status: DC

## 2021-05-08 MED ORDER — LISINOPRIL 20 MG PO TABS: 20.0000 mg | ORAL_TABLET | Freq: Every day | ORAL | 1 refills | Status: DC

## 2021-05-08 NOTE — Telephone Encounter (Signed)
Script sent to [facility]

## 2021-05-11 ENCOUNTER — Other Ambulatory Visit: Payer: Self-pay

## 2021-05-11 ENCOUNTER — Other Ambulatory Visit: Payer: Self-pay | Admitting: Internal Medicine

## 2021-05-11 MED ORDER — GABAPENTIN 300 MG PO CAPS: ORAL_CAPSULE | ORAL | 1 refills | Status: DC

## 2021-06-05 DIAGNOSIS — E113553 Type 2 diabetes mellitus with stable proliferative diabetic retinopathy, bilateral: Secondary | ICD-10-CM | POA: Diagnosis not present

## 2021-06-05 DIAGNOSIS — Z961 Presence of intraocular lens: Secondary | ICD-10-CM | POA: Diagnosis not present

## 2021-06-05 DIAGNOSIS — H401131 Primary open-angle glaucoma, bilateral, mild stage: Secondary | ICD-10-CM | POA: Diagnosis not present

## 2021-06-05 DIAGNOSIS — H0288B Meibomian gland dysfunction left eye, upper and lower eyelids: Secondary | ICD-10-CM | POA: Diagnosis not present

## 2021-06-05 DIAGNOSIS — H0288A Meibomian gland dysfunction right eye, upper and lower eyelids: Secondary | ICD-10-CM | POA: Diagnosis not present

## 2021-06-05 DIAGNOSIS — Z794 Long term (current) use of insulin: Secondary | ICD-10-CM | POA: Diagnosis not present

## 2021-06-15 ENCOUNTER — Ambulatory Visit: Payer: Medicare PPO | Admitting: Internal Medicine

## 2021-06-15 DIAGNOSIS — H43813 Vitreous degeneration, bilateral: Secondary | ICD-10-CM | POA: Diagnosis not present

## 2021-06-15 DIAGNOSIS — H3582 Retinal ischemia: Secondary | ICD-10-CM | POA: Diagnosis not present

## 2021-06-15 DIAGNOSIS — E113513 Type 2 diabetes mellitus with proliferative diabetic retinopathy with macular edema, bilateral: Secondary | ICD-10-CM | POA: Diagnosis not present

## 2021-06-18 ENCOUNTER — Other Ambulatory Visit: Payer: Self-pay | Admitting: Internal Medicine

## 2021-06-18 DIAGNOSIS — Z1231 Encounter for screening mammogram for malignant neoplasm of breast: Secondary | ICD-10-CM

## 2021-06-29 ENCOUNTER — Ambulatory Visit: Payer: Medicare PPO | Admitting: Internal Medicine

## 2021-07-03 ENCOUNTER — Ambulatory Visit: Payer: Medicare PPO

## 2021-07-03 ENCOUNTER — Other Ambulatory Visit: Payer: Self-pay

## 2021-07-13 ENCOUNTER — Other Ambulatory Visit: Payer: Self-pay

## 2021-07-13 ENCOUNTER — Ambulatory Visit: Payer: Medicare Other | Admitting: Internal Medicine

## 2021-07-13 ENCOUNTER — Other Ambulatory Visit: Payer: Self-pay | Admitting: Internal Medicine

## 2021-07-13 ENCOUNTER — Ambulatory Visit
Admission: RE | Admit: 2021-07-13 | Discharge: 2021-07-13 | Disposition: A | Discharge status: Home / Self Care | Payer: Medicare Other | Source: Ambulatory Visit | Attending: Internal Medicine | Admitting: Internal Medicine

## 2021-07-13 ENCOUNTER — Encounter: Payer: Self-pay | Admitting: Internal Medicine

## 2021-07-13 VITALS — BP 190/110 | HR 84 | Ht 61.0 in | Wt 160.8 lb

## 2021-07-13 DIAGNOSIS — Z794 Long term (current) use of insulin: Secondary | ICD-10-CM | POA: Diagnosis not present

## 2021-07-13 DIAGNOSIS — E1165 Type 2 diabetes mellitus with hyperglycemia: Secondary | ICD-10-CM | POA: Diagnosis not present

## 2021-07-13 DIAGNOSIS — Z780 Asymptomatic menopausal state: Secondary | ICD-10-CM | POA: Diagnosis not present

## 2021-07-13 DIAGNOSIS — E785 Hyperlipidemia, unspecified: Secondary | ICD-10-CM

## 2021-07-13 DIAGNOSIS — E2839 Other primary ovarian failure: Secondary | ICD-10-CM

## 2021-07-13 LAB — LIPID PANEL
Cholesterol: 238 mg/dL — ABNORMAL HIGH (ref 0–200)
HDL: 56.5 mg/dL (ref >39.00)
LDL Cholesterol: 161 mg/dL — ABNORMAL HIGH (ref 0–99)
NonHDL: 181.95
Total CHOL/HDL Ratio: 4
Triglycerides: 103 mg/dL (ref 0.0–149.0)
VLDL: 20.6 mg/dL (ref 0.0–40.0)

## 2021-07-13 LAB — BASIC METABOLIC PANEL
BUN: 10 mg/dL (ref 6–23)
CO2: 29 mEq/L (ref 19–32)
Calcium: 9.4 mg/dL (ref 8.4–10.5)
Chloride: 100 mEq/L (ref 96–112)
Creatinine, Ser: 0.71 mg/dL (ref 0.40–1.20)
GFR: 88.87 mL/min (ref >60.00)
Glucose, Bld: 326 mg/dL — ABNORMAL HIGH (ref 70–99)
Potassium: 4.5 mEq/L (ref 3.5–5.1)
Sodium: 136 mEq/L (ref 135–145)

## 2021-07-13 LAB — POCT GLYCOSYLATED HEMOGLOBIN (HGB A1C): Hemoglobin A1C: 14 % — AB (ref 4.0–5.6)

## 2021-07-13 LAB — MICROALBUMIN / CREATININE URINE RATIO
Creatinine,U: 53.9 mg/dL
Microalb Creat Ratio: 12.5 mg/g (ref 0.0–30.0)
Microalb, Ur: 6.7 mg/dL — ABNORMAL HIGH (ref 0.0–1.9)

## 2021-07-13 LAB — GLUCOSE, POCT (MANUAL RESULT ENTRY): POC Glucose: 340 mg/dl — AB (ref 70–99)

## 2021-07-13 MED ORDER — LISINOPRIL 40 MG PO TABS: 40.0000 mg | ORAL_TABLET | Freq: Every day | ORAL | 3 refills | Status: DC

## 2021-07-13 MED ORDER — ATORVASTATIN CALCIUM 20 MG PO TABS: 20.0000 mg | ORAL_TABLET | Freq: Every day | ORAL | 3 refills | Status: DC

## 2021-07-13 MED ORDER — TRESIBA FLEXTOUCH 100 UNIT/ML ~~LOC~~ SOPN: 16.0000 [IU] | PEN_INJECTOR | Freq: Every day | SUBCUTANEOUS | 6 refills | Status: DC

## 2021-07-13 NOTE — Progress Notes (Signed)
Name: Sharon PiesYolanda B Mcdonald  MRN/ DOB: [id number], Oct 10, 1955   Age/ Sex: 66 y.o., female    PCP: Dorothyann PengSanders, Robyn, MD   Reason for Endocrinology Evaluation: Type 2 Diabetes Mellitus     Date of Initial Endocrinology Visit: 03/09/2021    PATIENT IDENTIFIER: Sharon Mcdonald is a 66 y.o. female with a past medical history of T2DM, HTN, and dyslipidemia. The patient presented for initial endocrinology clinic visit on 03/09/2021 for consultative assistance with her diabetes management.      HPI: Sharon Mcdonald was    Diagnosed with DM in 2003 Prior Medications tried/Intolerance: She was on Ozempic, basal insulin, and Synjardy without intolerance      Hemoglobin A1c has ranged from 6.3% in 2021, peaking at 12.3% in 2022.  Mother and Brother with DM   Lives with husbands, retired Airline pilotaccountant at [employer]    On her initial visit to our clinic she had an A1c of 10.0% . She was not on any glycemic agents for about a month before her presentation. We restarted Synjardy and started basal insulin   SUBJECTIVE:   During the last visit (03/09/2021): 10.0 % restarted Synjardy and started [place]iba   Today (07/13/21): Sharon Mcdonald is here for a follow up on diabetes management.   She checks her blood sugars sporadically. The patient has not had hypoglycemic episodes since the last clinic visit  She drinks juice for breakfast  She has not been taking insulin  She takes Synjardy intermittently     HOME DIABETES REGIMEN: Synjardy 12.11-998 mg 2 tabs daily - intermittent intake  Tresiba 20 units daily - not taking      Statin: yes ACE-I/ARB: yes Prior Diabetic Education: yes    METER DOWNLOAD SUMMARY: Did not bing   DIABETIC COMPLICATIONS: Microvascular complications:   retinopathy (receives injection bilaterally ) Denies: CKD Last eye exam: Completed 01/2021 Dr. Dione BoozeGroat   Macrovascular complications:   Denies: CAD, PVD, CVA   PAST HISTORY: Past Medical History:  Past Medical  History:  Diagnosis Date   Diabetes mellitus    High cholesterol    Hypertension    Past Surgical History:  Past Surgical History:  Procedure Laterality Date   BREAST CYST EXCISION Left    BREAST SURGERY     REDUCTION MAMMAPLASTY Bilateral    TONSILLECTOMY     TUBAL LIGATION      Social History:  reports that she quit smoking about 42 years ago. Her smoking use included cigarettes. She has never used smokeless tobacco. She reports that she does not drink alcohol and does not use drugs. Family History:  Family History  Problem Relation Age of Onset   Diabetes Mother    Hypertension Mother    Obesity Mother    Diabetes Brother      HOME MEDICATIONS: Allergies as of 07/13/2021   No Known Allergies      Medication List        Accurate as of July 13, 2021 12:46 PM. If you have any questions, ask your nurse or doctor.          Dexcom G6 Sensor Misc 1 Device by Does not apply route as directed.   Dexcom G6 Transmitter Misc 1 Device by Does not apply route as directed.   dorzolamide-timolol 22.3-6.8 MG/ML ophthalmic solution Commonly known as: COSOPT 1 drop 2 (two) times daily.   gabapentin 300 MG capsule Commonly known as: NEURONTIN TAKE 1 CAPSULE BY MOUTH EVERY DAY   Insulin Pen Needle 32G  X 4 MM Misc 1 Device by Does not apply route daily in the afternoon.   lisinopril 40 MG tablet Commonly known as: ZESTRIL Take 1 tablet (40 mg total) by mouth daily. What changed:  medication strength how much to take Changed by: Scarlette Shorts, MD   meclizine 12.5 MG tablet Commonly known as: ANTIVERT Take 1 tablet (12.5 mg total) by mouth 3 (three) times daily as needed for dizziness.   OneTouch Verio test strip Generic drug: glucose blood 1 each by Other route in the morning and at bedtime. Use as instructed   pravastatin 20 MG tablet Commonly known as: PRAVACHOL Take 1 tablet (20 mg total) by mouth daily.   Synjardy XR 12.11-998 MG Tb24 Generic  drug: Empagliflozin-metFORMIN HCl ER Take 2 tablets by mouth daily.   Evaristo Bury FlexTouch 100 UNIT/ML FlexTouch Pen Generic drug: insulin degludec Inject 16 Units into the skin daily. What changed: how much to take Changed by: Scarlette Shorts, MD         ALLERGIES: No Known Allergies   REVIEW OF SYSTEMS: A comprehensive ROS was conducted with the patient and is negative except as per HPI     OBJECTIVE:   VITAL SIGNS: BP (!) 190/110    Pulse 84    Ht 5\' 1"  (1.549 m)    Wt 160 lb 12.8 oz (72.9 kg)    SpO2 94%    BMI 30.38 kg/m    PHYSICAL EXAM:  General: Pt appears well and is in NAD  Neck: General: Supple without adenopathy or carotid bruits. Thyroid: Thyroid size normal.  No goiter or nodules appreciated.   Lungs: Clear with good BS bilat with no rales, rhonchi, or wheezes  Heart: RRR   Abdomen: Normoactive bowel sounds, soft, nontender, without masses or organomegaly palpable  Extremities:  Lower extremities - No pretibial edema. No lesions.  Neuro: MS is good with appropriate affect, pt is alert and Ox3    DM foot exam: 03/09/2021  The skin of the feet is intact without sores or ulcerations. The pedal pulses are 2+ on right and 2+ on left. The sensation is intact to a screening 5.07, 10 gram monofilament bilaterally   DATA REVIEWED:  Lab Results  Component Value Date   HGBA1C 14.0 (A) 07/13/2021   HGBA1C 10.0 (H) 01/09/2021   HGBA1C 12.3 (H) 10/30/2020   Lab Results  Component Value Date   MICROALBUR 80 11/27/2020   LDLCALC 109 (H) 07/27/2020   CREATININE 0.72 01/09/2021   Lab Results  Component Value Date   MICRALBCREAT 300 11/27/2020    Lab Results  Component Value Date   CHOL 180 07/27/2020   HDL 55 07/27/2020   LDLCALC 109 (H) 07/27/2020   TRIG 88 07/27/2020   CHOLHDL 3.3 07/27/2020        ASSESSMENT / PLAN / RECOMMENDATIONS:   1) Type 2 Diabetes Mellitus, Poorly controlled, With retinopathic complications - Most recent A1c of 14.0%.  Goal A1c < 7.0 %.     -A1c increased from 10.0% to 14.0 % I suspect she did not take any of her meds. Pt states she has not taken insulin but was taking synjardy intermittently.  - I again emphaized the importance of taking medications as prescribed and if she has issues with obtaining medications to contact 07/29/2020 or the pharmacy . - Discussed risk of microvascular complications with uncontrolled diabetes  - We held  Ozempic due to reported stool incontinence   MEDICATIONS: Take  12.11-998 mg 2  tabs every morning Start Tresiba 16  units daily  EDUCATION / INSTRUCTIONS: BG monitoring instructions: Patient is instructed to check glucose once a day I reviewed the Rule of 15 for the treatment of hypoglycemia in detail with the patient. Literature supplied.   2) Diabetic complications:  Eye: Does  have known diabetic retinopathy.  Neuro/ Feet: Does not have known diabetic peripheral neuropathy. Renal: Patient does not have known baseline CKD. She is  on an ACEI/ARB at present.   3) Dyslipidemia:   -We discussed cardiovascular benefits of statins to reduce risk of strokes in future MIs - Lipid panel today is above goal with LDL 161 mg/dL. Will switch Pravastatin to atorvastatin    Medication  Stop Pravastatin 20 mg daily  Start Atorvastatin 20 mg daily    4) HTN :  - This is out of control. This is due to medication non-adherence.  - She is asymptomatic at this time  - Discussed her increased risk for CVA and CAD with uncontrolled HTN - I will increase her lisinopril as below  - She will return in a week for a BP check  - Pt advised to report to ED with any headaches, chest pain, palpitations , change in mental status etc    Medication  Increase Lisinopril to 40 mg daily     F/U in 4 months  Signed electronically by: Lyndle Herrlich, MD  [facility] Endocrinology  [facility] [address] [place] [place], [place] [address] Phone:  [phone] FAX: [phone]   CC: Dorothyann Peng, MD [address] [place] [address] Phone: [phone]  Fax: [phone]    Return to Endocrinology clinic as below: Future Appointments  Date Time Provider Department Center  07/13/2021  3:30 PM [facility] DX DEXA 1 [facility]  07/17/2021  4:40 PM [facility] MM 3 [facility]  07/20/2021 10:00 AM [facility] NURSE [facility] None  07/26/2021 10:00 AM [facility] None  07/26/2021 11:00 AM Dorothyann Peng, MD [facility] None  11/16/2021 10:10 AM Gentle Hoge, Konrad Dolores, MD [facility] None  11/29/2021 11:00 AM Dorothyann Peng, MD [facility] None

## 2021-07-13 NOTE — Patient Instructions (Addendum)
-   Synjardy 12.11-998 mg , TWO tablets with breakfast  - Start Tresiba 16 units daily     HOW TO TREAT LOW BLOOD SUGARS (Blood sugar LESS THAN 70 MG/DL) Please follow the RULE OF 15 for the treatment of hypoglycemia treatment (when your (blood sugars are less than 70 mg/dL)   STEP 1: Take 15 grams of carbohydrates when your blood sugar is low, which includes:  3-4 GLUCOSE TABS  OR 3-4 OZ OF JUICE OR REGULAR SODA OR ONE TUBE OF GLUCOSE GEL    STEP 2: RECHECK blood sugar in 15 MINUTES STEP 3: If your blood sugar is still low at the 15 minute recheck --> then, go back to STEP 1 and treat AGAIN with another 15 grams of carbohydrates.

## 2021-07-17 ENCOUNTER — Ambulatory Visit
Admission: RE | Admit: 2021-07-17 | Discharge: 2021-07-17 | Disposition: A | Discharge status: Home / Self Care | Payer: Medicare Other | Source: Ambulatory Visit | Attending: Internal Medicine | Admitting: Internal Medicine

## 2021-07-17 ENCOUNTER — Other Ambulatory Visit: Payer: Self-pay

## 2021-07-17 DIAGNOSIS — Z1231 Encounter for screening mammogram for malignant neoplasm of breast: Secondary | ICD-10-CM

## 2021-07-20 ENCOUNTER — Other Ambulatory Visit: Payer: Self-pay

## 2021-07-20 ENCOUNTER — Ambulatory Visit: Payer: Medicare Other

## 2021-07-20 VITALS — BP 136/92

## 2021-07-20 DIAGNOSIS — Z0130 Encounter for examination of blood pressure without abnormal findings: Secondary | ICD-10-CM

## 2021-07-20 NOTE — Progress Notes (Signed)
Patient advise to make sure she follows up with pcp. Blood pressure was better today.

## 2021-07-26 ENCOUNTER — Ambulatory Visit: Payer: Self-pay

## 2021-07-26 ENCOUNTER — Ambulatory Visit (INDEPENDENT_AMBULATORY_CARE_PROVIDER_SITE_OTHER): Payer: Medicare Other

## 2021-07-26 ENCOUNTER — Ambulatory Visit: Payer: 59 | Admitting: Internal Medicine

## 2021-07-26 VITALS — Ht 61.0 in | Wt 158.0 lb

## 2021-07-26 DIAGNOSIS — Z0000 Encounter for general adult medical examination without abnormal findings: Secondary | ICD-10-CM | POA: Diagnosis not present

## 2021-07-26 NOTE — Patient Instructions (Signed)
[name] , Thank you for taking time to come for your Medicare Wellness Visit. I appreciate your ongoing commitment to your health goals. Please review the following plan we discussed and let me know if I can assist you in the future.   Screening recommendations/referrals: Colonoscopy: completed 01/11/2015 Mammogram: completed 07/17/2021 Bone Density: completed 07/13/2021 Recommended yearly ophthalmology/optometry visit for glaucoma screening and checkup Recommended yearly dental visit for hygiene and checkup  Vaccinations: Influenza vaccine: decline Pneumococcal vaccine: decline Tdap vaccine: completed 06/03/2017, due 06/04/2027 Shingles vaccine: discussed    Covid-19: 07/03/2021, 04/24/2020, 10/12/2019, 09/17/2019  Advanced directives: Please bring a copy of your POA (Power of Attorney) and/or Living Will to your next appointment.   Conditions/risks identified: none  Next appointment: Follow up in one year for your annual wellness visit    Preventive Care 65 Years and Older, Female Preventive care refers to lifestyle choices and visits with your health care provider that can promote health and wellness. What does preventive care include? A yearly physical exam. This is also called an annual well check. Dental exams once or twice a year. Routine eye exams. Ask your health care provider how often you should have your eyes checked. Personal lifestyle choices, including: Daily care of your teeth and gums. Regular physical activity. Eating a healthy diet. Avoiding tobacco and drug use. Limiting alcohol use. Practicing safe sex. Taking low-dose aspirin every day. Taking vitamin and mineral supplements as recommended by your health care provider. What happens during an annual well check? The services and screenings done by your health care provider during your annual well check will depend on your age, overall health, lifestyle risk factors, and family history of disease. Counseling   Your health care provider may ask you questions about your: Alcohol use. Tobacco use. Drug use. Emotional well-being. Home and relationship well-being. Sexual activity. Eating habits. History of falls. Memory and ability to understand (cognition). Work and work Astronomer. Reproductive health. Screening  You may have the following tests or measurements: Height, weight, and BMI. Blood pressure. Lipid and cholesterol levels. These may be checked every 5 years, or more frequently if you are over 58 years old. Skin check. Lung cancer screening. You may have this screening every year starting at age 76 if you have a 30-pack-year history of smoking and currently smoke or have quit within the past 15 years. Fecal occult blood test (FOBT) of the stool. You may have this test every year starting at age 6. Flexible sigmoidoscopy or colonoscopy. You may have a sigmoidoscopy every 5 years or a colonoscopy every 10 years starting at age 88. Hepatitis C blood test. Hepatitis B blood test. Sexually transmitted disease (STD) testing. Diabetes screening. This is done by checking your blood sugar (glucose) after you have not eaten for a while (fasting). You may have this done every 1-3 years. Bone density scan. This is done to screen for osteoporosis. You may have this done starting at age 82. Mammogram. This may be done every 1-2 years. Talk to your health care provider about how often you should have regular mammograms. Talk with your health care provider about your test results, treatment options, and if necessary, the need for more tests. Vaccines  Your health care provider may recommend certain vaccines, such as: Influenza vaccine. This is recommended every year. Tetanus, diphtheria, and acellular pertussis (Tdap, Td) vaccine. You may need a Td booster every 10 years. Zoster vaccine. You may need this after age 19. Pneumococcal 13-valent conjugate (PCV13) vaccine. One dose  is recommended  after age 73. Pneumococcal polysaccharide (PPSV23) vaccine. One dose is recommended after age 64. Talk to your health care provider about which screenings and vaccines you need and how often you need them. This information is not intended to replace advice given to you by your health care provider. Make sure you discuss any questions you have with your health care provider. Document Released: 07/21/2015 Document Revised: 03/13/2016 Document Reviewed: 04/25/2015 Elsevier Interactive Patient Education  2017 [place] Prevention in the Home Falls can cause injuries. They can happen to people of all ages. There are many things you can do to make your home safe and to help prevent falls. What can I do on the outside of my home? Regularly fix the edges of walkways and driveways and fix any cracks. Remove anything that might make you trip as you walk through a door, such as a raised step or threshold. Trim any bushes or trees on the path to your home. Use bright outdoor lighting. Clear any walking paths of anything that might make someone trip, such as rocks or tools. Regularly check to see if handrails are loose or broken. Make sure that both sides of any steps have handrails. Any raised decks and porches should have guardrails on the edges. Have any leaves, snow, or ice cleared regularly. Use sand or salt on walking paths during winter. Clean up any spills in your garage right away. This includes oil or grease spills. What can I do in the bathroom? Use night lights. Install grab bars by the toilet and in the tub and shower. Do not use towel bars as grab bars. Use non-skid mats or decals in the tub or shower. If you need to sit down in the shower, use a plastic, non-slip stool. Keep the floor dry. Clean up any water that spills on the floor as soon as it happens. Remove soap buildup in the tub or shower regularly. Attach bath mats securely with double-sided non-slip rug tape. Do not  have throw rugs and other things on the floor that can make you trip. What can I do in the bedroom? Use night lights. Make sure that you have a light by your bed that is easy to reach. Do not use any sheets or blankets that are too big for your bed. They should not hang down onto the floor. Have a firm chair that has side arms. You can use this for support while you get dressed. Do not have throw rugs and other things on the floor that can make you trip. What can I do in the kitchen? Clean up any spills right away. Avoid walking on wet floors. Keep items that you use a lot in easy-to-reach places. If you need to reach something above you, use a strong step stool that has a grab bar. Keep electrical cords out of the way. Do not use floor polish or wax that makes floors slippery. If you must use wax, use non-skid floor wax. Do not have throw rugs and other things on the floor that can make you trip. What can I do with my stairs? Do not leave any items on the stairs. Make sure that there are handrails on both sides of the stairs and use them. Fix handrails that are broken or loose. Make sure that handrails are as long as the stairways. Check any carpeting to make sure that it is firmly attached to the stairs. Fix any carpet that is loose or worn. Avoid  having throw rugs at the top or bottom of the stairs. If you do have throw rugs, attach them to the floor with carpet tape. Make sure that you have a light switch at the top of the stairs and the bottom of the stairs. If you do not have them, ask someone to add them for you. What else can I do to help prevent falls? Wear shoes that: Do not have high heels. Have rubber bottoms. Are comfortable and fit you well. Are closed at the toe. Do not wear sandals. If you use a stepladder: Make sure that it is fully opened. Do not climb a closed stepladder. Make sure that both sides of the stepladder are locked into place. Ask someone to hold it for  you, if possible. Clearly mark and make sure that you can see: Any grab bars or handrails. First and last steps. Where the edge of each step is. Use tools that help you move around (mobility aids) if they are needed. These include: Canes. Walkers. Scooters. Crutches. Turn on the lights when you go into a dark area. Replace any light bulbs as soon as they burn out. Set up your furniture so you have a clear path. Avoid moving your furniture around. If any of your floors are uneven, fix them. If there are any pets around you, be aware of where they are. Review your medicines with your doctor. Some medicines can make you feel dizzy. This can increase your chance of falling. Ask your doctor what other things that you can do to help prevent falls. This information is not intended to replace advice given to you by your health care provider. Make sure you discuss any questions you have with your health care provider. Document Released: 04/20/2009 Document Revised: 11/30/2015 Document Reviewed: 07/29/2014 Elsevier Interactive Patient Education  2017 [employer].

## 2021-07-26 NOTE — Progress Notes (Signed)
I connected with [facility] today by telephone and verified that I am speaking with the correct person using two identifiers. Location patient: home Location provider: work Persons participating in the virtual visit: Marc Morgans, Elisha Ponder LPN.   I discussed the limitations, risks, security and privacy concerns of performing an evaluation and management service by telephone and the availability of in person appointments. I also discussed with the patient that there may be a patient responsible charge related to this service. The patient expressed understanding and verbally consented to this telephonic visit.    Interactive audio and video telecommunications were attempted between this provider and patient, however failed, due to patient having technical difficulties OR patient did not have access to video capability.  We continued and completed visit with audio only.     Vital signs may be patient reported or missing.  Subjective:   Sharon Mcdonald is a 66 y.o. female who presents for Medicare Annual (Subsequent) preventive examination.  Review of Systems     Cardiac Risk Factors include: advanced age (>38men, >15 women);diabetes mellitus;hypertension     Objective:    Today's Vitals   07/26/21 1556  Weight: 158 lb (71.7 kg)  Height: 5\' 1"  (1.549 m)   Body mass index is 29.85 kg/m.  Advanced Directives 07/26/2021 07/27/2020 06/26/2019  Does Patient Have a Medical Advance Directive? Yes Yes No  Type of 06/28/2019 of [place]icult [place];Living will Healthcare Power of Attorney -  Copy of Healthcare Power of Attorney in Chart? No - copy requested No - copy requested -  Would patient like information on creating a medical advance directive? - - No - Guardian declined    Current Medications (verified) Outpatient Encounter Medications as of 07/26/2021  Medication Sig   atorvastatin (LIPITOR) 20 MG tablet Take 1 tablet (20 mg total) by mouth daily.    dorzolamide-timolol (COSOPT) 22.3-6.8 MG/ML ophthalmic solution 1 drop 2 (two) times daily.   Empagliflozin-metFORMIN HCl ER (SYNJARDY XR) 12.11-998 MG TB24 Take 2 tablets by mouth daily.   gabapentin (NEURONTIN) 300 MG capsule TAKE 1 CAPSULE BY MOUTH EVERY DAY   glucose blood (ONETOUCH VERIO) test strip 1 each by Other route in the morning and at bedtime. Use as instructed   insulin degludec (TRESIBA FLEXTOUCH) 100 UNIT/ML FlexTouch Pen Inject 16 Units into the skin daily.   Insulin Pen Needle 32G X 4 MM MISC 1 Device by Does not apply route daily in the afternoon.   lisinopril (ZESTRIL) 40 MG tablet Take 1 tablet (40 mg total) by mouth daily.   Continuous Blood Gluc Sensor (DEXCOM G6 SENSOR) MISC 1 Device by Does not apply route as directed. (Patient not taking: Reported on 07/13/2021)   Continuous Blood Gluc Transmit (DEXCOM G6 TRANSMITTER) MISC 1 Device by Does not apply route as directed. (Patient not taking: Reported on 07/13/2021)   meclizine (ANTIVERT) 12.5 MG tablet Take 1 tablet (12.5 mg total) by mouth 3 (three) times daily as needed for dizziness. (Patient not taking: Reported on 07/26/2021)   No facility-administered encounter medications on file as of 07/26/2021.    Allergies (verified) Patient has no known allergies.   History: Past Medical History:  Diagnosis Date   Diabetes mellitus    High cholesterol    Hypertension    Past Surgical History:  Procedure Laterality Date   BREAST CYST EXCISION Left    BREAST SURGERY     REDUCTION MAMMAPLASTY Bilateral    TONSILLECTOMY     TUBAL LIGATION  Family History  Problem Relation Age of Onset   Diabetes Mother    Hypertension Mother    Obesity Mother    Diabetes Brother    Social History   Socioeconomic History   Marital status: Married    Spouse name: Not on file   Number of children: Not on file   Years of education: Not on file   Highest education level: Not on file  Occupational History   Not on file  Tobacco  Use   Smoking status: Former    Types: Cigarettes    Quit date: 09/07/1978    Years since quitting: 42.9   Smokeless tobacco: Never   Tobacco comments:    1/2 pack per week  Vaping Use   Vaping Use: Never used  Substance and Sexual Activity   Alcohol use: No   Drug use: No   Sexual activity: Yes    Birth control/protection: Surgical  Other Topics Concern   Not on file  Social History Narrative   Not on file   Social Determinants of Health   Financial Resource Strain: Low Risk    Difficulty of Paying Living Expenses: Not hard at all  Food Insecurity: No Food Insecurity   Worried About Programme researcher, broadcasting/film/video in the Last Year: Never true   Ran Out of Food in the Last Year: Never true  Transportation Needs: No Transportation Needs   Lack of Transportation (Medical): No   Lack of Transportation (Non-Medical): No  Physical Activity: Insufficiently Active   Days of Exercise per Week: 2 days   Minutes of Exercise per Session: 40 min  Stress: No Stress Concern Present   Feeling of Stress : Not at all  Social Connections: Socially Integrated   Frequency of Communication with Friends and Family: More than three times a week   Frequency of Social Gatherings with Friends and Family: Three times a week   Attends Religious Services: More than 4 times per year   Active Member of Clubs or Organizations: Yes   Attends Engineer, structural: More than 4 times per year   Marital Status: Married    Tobacco Counseling Counseling given: Not Answered Tobacco comments: 1/2 pack per week   Clinical Intake:  Pre-visit preparation completed: Yes  Pain : No/denies pain     Nutritional Status: BMI 25 -29 Overweight Nutritional Risks: None Diabetes: Yes  How often do you need to have someone help you when you read instructions, pamphlets, or other written materials from your doctor or pharmacy?: 1 - Never  Diabetic? Yes Nutrition Risk Assessment:  Has the patient had any N/V/D  within the last 2 months?  No  Does the patient have any non-healing wounds?  No  Has the patient had any unintentional weight loss or weight gain?  No   Diabetes:  Is the patient diabetic?  Yes  If diabetic, was a CBG obtained today?  No  Did the patient bring in their glucometer from home?  No  How often do you monitor your CBG's? daily.   Financial Strains and Diabetes Management:  Are you having any financial strains with the device, your supplies or your medication? No .  Does the patient want to be seen by Chronic Care Management for management of their diabetes?  No  Would the patient like to be referred to a Nutritionist or for Diabetic Management?  No   Diabetic Exams:  Diabetic Eye Exam: Completed 09/29/2020 Diabetic Foot Exam: Completed 11/27/2020   Interpreter  Needed?: No  Information entered by :: NAllen LPN   Activities of Daily Living In your present state of health, do you have any difficulty performing the following activities: 07/26/2021 07/27/2020  Hearing? N N  Vision? N N  Difficulty concentrating or making decisions? N N  Walking or climbing stairs? N N  Dressing or bathing? N N  Doing errands, shopping? N N  Preparing Food and eating ? N N  Using the Toilet? N N  In the past six months, have you accidently leaked urine? N N  Do you have problems with loss of bowel control? N N  Managing your Medications? N N  Managing your Finances? N N  Housekeeping or managing your Housekeeping? N N  Some recent data might be hidden    Patient Care Team: Dorothyann PengSanders, Robyn, MD as PCP - General (Internal Medicine)  Indicate any recent Medical Services you may have received from other than [facility] providers in the past year (date may be approximate).     Assessment:   This is a routine wellness examination for Betzaira.  Hearing/Vision screen Vision Screening - Comments:: Regular eye exams, [facility]  Dietary issues and exercise activities  discussed: Current Exercise Habits: Home exercise routine, Type of exercise: walking, Time (Minutes): 45, Frequency (Times/Week): 2, Weekly Exercise (Minutes/Week): 90   Goals Addressed             This Visit's Progress    Patient Stated       07/26/2021, get off medications       Depression Screen PHQ 2/9 Scores 07/26/2021 07/27/2020 01/03/2020 04/01/2019 10/27/2018 06/24/2018  PHQ - 2 Score 0 0 0 0 0 0    Fall Risk Fall Risk  07/26/2021 07/27/2020 01/03/2020 06/30/2019 04/01/2019  Falls in the past year? 0 0 0 1 0  Number falls in past yr: - - 0 0 -  Injury with Fall? - - 0 1 -  Risk for fall due to : Medication side effect - - - -  Follow up Falls evaluation completed;Education provided;Falls prevention discussed - - - -    FALL RISK PREVENTION PERTAINING TO THE HOME:  Any stairs in or around the home? Yes  If so, are there any without handrails? No  Home free of loose throw rugs in walkways, pet beds, electrical cords, etc? Yes  Adequate lighting in your home to reduce risk of falls? Yes   ASSISTIVE DEVICES UTILIZED TO PREVENT FALLS:  Life alert? No  Use of a cane, walker or w/c? No  Grab bars in the bathroom? No  Shower chair or bench in shower? No  Elevated toilet seat or a handicapped toilet? No   TIMED UP AND GO:  Was the test performed? No .      Cognitive Function:     6CIT Screen 07/26/2021 07/27/2020  What Year? 0 points 0 points  What month? 0 points 0 points  What time? 0 points 0 points  Count back from 20 0 points 0 points  Months in reverse 0 points 0 points  Repeat phrase 0 points 0 points  Total Score 0 0    Immunizations Immunization History  Administered Date(s) Administered   DTaP 06/03/2017   PFIZER(Purple Top)SARS-COV-2 Vaccination 09/17/2019, 10/12/2019, 04/24/2020   Pfizer Covid-19 Vaccine Bivalent Booster 5622yrs & up 07/03/2021    TDAP status: Up to date  Flu Vaccine status: Declined, Education has been provided regarding the  importance of this vaccine but patient still declined. Advised  may receive this vaccine at local pharmacy or Health Dept. Aware to provide a copy of the vaccination record if obtained from local pharmacy or Health Dept. Verbalized acceptance and understanding.  Pneumococcal vaccine status: Declined,  Education has been provided regarding the importance of this vaccine but patient still declined. Advised may receive this vaccine at local pharmacy or Health Dept. Aware to provide a copy of the vaccination record if obtained from local pharmacy or Health Dept. Verbalized acceptance and understanding.   Covid-19 vaccine status: Completed vaccines  Qualifies for Shingles Vaccine? Yes   Zostavax completed No   Shingrix Completed?: No.    Education has been provided regarding the importance of this vaccine. Patient has been advised to call insurance company to determine out of pocket expense if they have not yet received this vaccine. Advised may also receive vaccine at local pharmacy or Health Dept. Verbalized acceptance and understanding.  Screening Tests Health Maintenance  Topic Date Due   INFLUENZA VACCINE  10/05/2021 (Originally 02/05/2021)   Zoster Vaccines- Shingrix (1 of 2) 10/24/2021 (Originally 07/19/1974)   Pneumonia Vaccine 6565+ Years old (1 - PCV) 07/26/2022 (Originally [birth year]19[birth year]1961)   OPHTHALMOLOGY EXAM  09/29/2021   FOOT EXAM  11/27/2021   HEMOGLOBIN A1C  01/10/2022   MAMMOGRAM  07/18/2023   COLONOSCOPY (Pts 45-1870yrs Insurance coverage will need to be confirmed)  01/10/2025   TETANUS/TDAP  06/04/2027   DEXA SCAN  Completed   COVID-19 Vaccine  Completed   Hepatitis C Screening  Completed   HPV VACCINES  Aged Out    Health Maintenance  There are no preventive care reminders to display for this patient.   Colorectal cancer screening: Type of screening: Colonoscopy. Completed 01/11/2015. Repeat every 10 years  Mammogram status: Completed 07/17/2021. Repeat every year  Bone Density  status: Completed 07/13/2021  Lung Cancer Screening: (Low Dose CT Chest recommended if Age 70-80 years, 30 pack-year currently smoking OR have quit w/in 15years.) does not qualify.   Lung Cancer Screening Referral: no  Additional Screening:  Hepatitis C Screening: does qualify; Completed 06/24/2018  Vision Screening: Recommended annual ophthalmology exams for early detection of glaucoma and other disorders of the eye. Is the patient up to date with their annual eye exam?  Yes  Who is the provider or what is the name of the office in which the patient attends annual eye exams? [facility] If pt is not established with a provider, would they like to be referred to a provider to establish care? No .   Dental Screening: Recommended annual dental exams for proper oral hygiene  Community Resource Referral / Chronic Care Management: CRR required this visit?  No   CCM required this visit?  No      Plan:     I have personally reviewed and noted the following in the patients chart:   Medical and social history Use of alcohol, tobacco or illicit drugs  Current medications and supplements including opioid prescriptions.  Functional ability and status Nutritional status Physical activity Advanced directives List of other physicians Hospitalizations, surgeries, and ER visits in previous 12 months Vitals Screenings to include cognitive, depression, and falls Referrals and appointments  In addition, I have reviewed and discussed with patient certain preventive protocols, quality metrics, and best practice recommendations. A written personalized care plan for preventive services as well as general preventive health recommendations were provided to patient.     Barb Merinoickeah E Johnathin Vanderschaaf, LPN   1/61/09601/19/2023   Nurse Notes: none

## 2021-07-30 ENCOUNTER — Ambulatory Visit: Payer: Self-pay | Admitting: Internal Medicine

## 2021-08-07 ENCOUNTER — Other Ambulatory Visit: Payer: Self-pay | Admitting: Internal Medicine

## 2021-08-07 MED ORDER — GABAPENTIN 300 MG PO CAPS: ORAL_CAPSULE | ORAL | 1 refills | Status: DC

## 2021-08-10 DIAGNOSIS — E113513 Type 2 diabetes mellitus with proliferative diabetic retinopathy with macular edema, bilateral: Secondary | ICD-10-CM | POA: Diagnosis not present

## 2021-08-10 DIAGNOSIS — H3582 Retinal ischemia: Secondary | ICD-10-CM | POA: Diagnosis not present

## 2021-08-10 DIAGNOSIS — H43813 Vitreous degeneration, bilateral: Secondary | ICD-10-CM | POA: Diagnosis not present

## 2021-08-15 ENCOUNTER — Other Ambulatory Visit: Payer: Self-pay

## 2021-08-15 ENCOUNTER — Encounter: Payer: Self-pay | Admitting: Internal Medicine

## 2021-08-15 ENCOUNTER — Ambulatory Visit (INDEPENDENT_AMBULATORY_CARE_PROVIDER_SITE_OTHER): Payer: Medicare Other | Admitting: Internal Medicine

## 2021-08-15 VITALS — BP 132/80 | HR 63 | Temp 98.3°F | Ht 60.6 in | Wt 163.4 lb

## 2021-08-15 DIAGNOSIS — R011 Cardiac murmur, unspecified: Secondary | ICD-10-CM | POA: Diagnosis not present

## 2021-08-15 DIAGNOSIS — R5383 Other fatigue: Secondary | ICD-10-CM

## 2021-08-15 DIAGNOSIS — N390 Urinary tract infection, site not specified: Secondary | ICD-10-CM

## 2021-08-15 DIAGNOSIS — H1131 Conjunctival hemorrhage, right eye: Secondary | ICD-10-CM | POA: Diagnosis not present

## 2021-08-15 DIAGNOSIS — E6609 Other obesity due to excess calories: Secondary | ICD-10-CM

## 2021-08-15 DIAGNOSIS — Z794 Long term (current) use of insulin: Secondary | ICD-10-CM

## 2021-08-15 DIAGNOSIS — E113593 Type 2 diabetes mellitus with proliferative diabetic retinopathy without macular edema, bilateral: Secondary | ICD-10-CM

## 2021-08-15 DIAGNOSIS — Z6831 Body mass index (BMI) 31.0-31.9, adult: Secondary | ICD-10-CM

## 2021-08-15 LAB — POCT URINALYSIS DIPSTICK
Bilirubin, UA: NEGATIVE
Blood, UA: NEGATIVE
Glucose, UA: POSITIVE — AB
Ketones, UA: NEGATIVE
Nitrite, UA: POSITIVE
Protein, UA: NEGATIVE
Spec Grav, UA: <=1.005 — AB (ref 1.010–1.025)
Urobilinogen, UA: 0.2 E.U./dL
pH, UA: 5 (ref 5.0–8.0)

## 2021-08-15 MED ORDER — CEFTRIAXONE SODIUM 500 MG IJ SOLR
500.0000 mg | Freq: Once | INTRAMUSCULAR | Status: AC
  Administered 2021-08-15: 500 mg via INTRAMUSCULAR

## 2021-08-15 MED ORDER — NITROFURANTOIN MONOHYD MACRO 100 MG PO CAPS: 100.0000 mg | ORAL_CAPSULE | Freq: Two times a day (BID) | ORAL | 0 refills | Status: AC

## 2021-08-15 NOTE — Progress Notes (Signed)
Rich Brave [place],acting as a Education administrator for [name], MD.,have documented all relevant documentation on the behalf of [name], MD,as directed by  [name], MD while in the presence of [name], MD.  This visit occurred during the SARS-CoV-2 public health emergency.  Safety protocols were in place, including screening questions prior to the visit, additional usage of staff PPE, and extensive cleaning of exam room while observing appropriate contact time as indicated for disinfecting solutions.  Subjective:     Patient ID: Sharon Mcdonald , female    DOB: 01-09-1956 , 66 y.o.   MRN: [id number]   Chief Complaint  Patient presents with   Fatigue   Back Pain   Diabetes    HPI  Patient presents today for fatigue and back pain. Her fatigue started on Friday. She is waking up in the morning still feeling tired. She also admits to urinary frequency and malodorous urine.   Back Pain This is a recurrent problem. The current episode started more than 1 month ago. The problem occurs constantly. Pain location: under her right shoulder blade and down. The quality of the pain is described as shooting. The pain does not radiate. The pain is moderate. The pain is The same all the time. The symptoms are aggravated by twisting.    Past Medical History:  Diagnosis Date   Diabetes mellitus    Diabetic retinopathy ([place])    High cholesterol    Hypertension      Family History  Problem Relation Age of Onset   Diabetes Mother    Hypertension Mother    Obesity Mother    Diabetes Brother      Current Outpatient Medications:    atorvastatin (LIPITOR) 20 MG tablet, Take 1 tablet (20 mg total) by mouth daily., Disp: 90 tablet, Rfl: 3   dorzolamide-timolol (COSOPT) 22.3-6.8 MG/ML ophthalmic solution, 1 drop 2 (two) times daily., Disp: , Rfl:    Empagliflozin-metFORMIN HCl ER (SYNJARDY XR) 12.11-998 MG TB24, Take 2 tablets by mouth daily., Disp: 180 tablet, Rfl: 1    gabapentin (NEURONTIN) 300 MG capsule, TAKE 1 CAPSULE BY MOUTH EVERY DAY, Disp: 90 capsule, Rfl: 1   glucose blood (ONETOUCH VERIO) test strip, 1 each by Other route in the morning and at bedtime. Use as instructed, Disp: 200 each, Rfl: 3   insulin degludec (TRESIBA FLEXTOUCH) 100 UNIT/ML FlexTouch Pen, Inject 16 Units into the skin daily., Disp: 15 mL, Rfl: 6   Insulin Pen Needle 32G X 4 MM MISC, 1 Device by Does not apply route daily in the afternoon., Disp: 100 each, Rfl: 3   lisinopril (ZESTRIL) 40 MG tablet, Take 1 tablet (40 mg total) by mouth daily., Disp: 90 tablet, Rfl: 3   No Known Allergies   Review of Systems  Constitutional: Negative.   Respiratory: Negative.    Cardiovascular: Negative.   Gastrointestinal: Negative.   Musculoskeletal:  Positive for back pain.  Neurological: Negative.   Psychiatric/Behavioral: Negative.      Today's Vitals   08/15/21 1522  BP: 132/80  Pulse: 63  Temp: 98.3 F (36.8 C)  Weight: 163 lb 6.4 oz (74.1 kg)  Height: 5' 0.6" (1.539 m)  PainSc: 5   PainLoc: Back   Body mass index is 31.28 kg/m.  Wt Readings from Last 3 Encounters:  08/15/21 163 lb 6.4 oz (74.1 kg)  07/26/21 158 lb (71.7 kg)  07/13/21 160 lb 12.8 oz (72.9 kg)     Objective:  Physical  Exam Vitals and nursing note reviewed.  Constitutional:      Appearance: Normal appearance.  HENT:     Head: Normocephalic and atraumatic.     Comments: Subconjunctival hemorrhage of right eye    Nose:     Comments: Masked     Mouth/Throat:     Comments: Masked  Eyes:     Extraocular Movements: Extraocular movements intact.  Cardiovascular:     Rate and Rhythm: Normal rate and regular rhythm.     Heart sounds: Murmur heard.  Pulmonary:     Effort: Pulmonary effort is normal.     Breath sounds: Normal breath sounds.  Abdominal:     Comments: Neg CVA tenderness  Skin:    General: Skin is warm.  Neurological:     General: No focal deficit present.     Mental Status: She is  alert.  Psychiatric:        Mood and Affect: Mood normal.        Behavior: Behavior normal.     Assessment And Plan:     1. Urinary tract infection without hematuria, site unspecified Comments: This is likely contributing to her flank pain and fatigue. I will send off urine culture. Rx sent for macrobid, encouraged to take full course of abx. She was also given Rocephin , 500mg  IM  x1.  - POCT Urinalysis Dipstick [id number]) - Urine Culture - cefTRIAXone (ROCEPHIN) injection 500 mg  2. Fatigue, unspecified type Comments: Likely exacerbated by acute infection. She is encouraged to stay well hydrated.   3. Heart murmur Comments: She agrees to referral for 2d-echocardiogram for further evaluation.  - ECHOCARDIOGRAM COMPLETE; Future  4. Type 2 diabetes mellitus with proliferative retinopathy of both eyes, with long-term current use of insulin, macular edema presence unspecified, unspecified proliferative retinopathy type ([place]) Comments: She is s/p Erylea injection per Ophthalmology.  - Microalbumin / Creatinine Urine Ratio  5. Subconjunctival hemorrhage of right eye Comments: She is s/p Eylea injection for retinopathy last Friday 08/10/21. Pt advised to notify her ophthalmologist if she develops pain.   6. Class 1 obesity due to excess calories with serious comorbidity and body mass index (BMI) of 31.0 to 31.9 in adult Comments: She is encouraged to aim for at least 150 minutes of exercise per week to help her lose 5-10% of her body weight.    Patient was given opportunity to ask questions. Patient verbalized understanding of the plan and was able to repeat key elements of the plan. All questions were answered to their satisfaction.   I, [name], MD, have reviewed all documentation for this visit. The documentation on 08/15/21 for the exam, diagnosis, procedures, and orders are all accurate and complete.   IF YOU HAVE BEEN REFERRED TO A SPECIALIST, IT MAY TAKE 1-2 WEEKS TO  SCHEDULE/PROCESS THE REFERRAL. IF YOU HAVE NOT HEARD FROM US/SPECIALIST IN TWO WEEKS, PLEASE GIVE [place] A CALL AT [phone].   THE PATIENT IS ENCOURAGED TO PRACTICE SOCIAL DISTANCING DUE TO THE COVID-19 PANDEMIC.

## 2021-08-16 LAB — MICROALBUMIN / CREATININE URINE RATIO
Creatinine, Urine: 50.3 mg/dL
Microalb/Creat Ratio: 23 mg/g creat (ref 0–29)
Microalbumin, Urine: 11.4 ug/mL

## 2021-08-18 LAB — URINE CULTURE

## 2021-08-22 ENCOUNTER — Encounter: Payer: Self-pay | Admitting: Internal Medicine

## 2021-08-22 ENCOUNTER — Other Ambulatory Visit: Payer: Self-pay

## 2021-08-22 MED ORDER — ACCU-CHEK GUIDE ME W/DEVICE KIT
PACK | 1 refills | Status: DC
Start: 2021-08-22 — End: 2023-01-16

## 2021-09-03 ENCOUNTER — Encounter: Payer: Self-pay | Admitting: Internal Medicine

## 2021-09-04 ENCOUNTER — Other Ambulatory Visit: Payer: Self-pay | Admitting: Internal Medicine

## 2021-09-04 MED ORDER — EMPAGLIFLOZIN 25 MG PO TABS: 25.0000 mg | ORAL_TABLET | Freq: Every day | ORAL | 3 refills | Status: DC

## 2021-09-04 NOTE — Progress Notes (Unsigned)
Stopped Synjardy due to diarrhea 09/04/2020. Started [place] alone

## 2021-09-06 ENCOUNTER — Encounter: Payer: Self-pay | Admitting: Internal Medicine

## 2021-09-06 ENCOUNTER — Other Ambulatory Visit (HOSPITAL_COMMUNITY): Payer: Self-pay

## 2021-09-06 ENCOUNTER — Telehealth: Payer: Self-pay

## 2021-09-06 NOTE — Telephone Encounter (Signed)
Prior Authorization needs to be submitted for a tier reduction on Jardiance. ?

## 2021-11-16 ENCOUNTER — Ambulatory Visit (INDEPENDENT_AMBULATORY_CARE_PROVIDER_SITE_OTHER): Payer: Medicare Other | Admitting: Internal Medicine

## 2021-11-16 ENCOUNTER — Other Ambulatory Visit: Payer: Self-pay | Admitting: Internal Medicine

## 2021-11-16 ENCOUNTER — Encounter: Payer: Self-pay | Admitting: Internal Medicine

## 2021-11-16 VITALS — BP 130/70 | HR 95 | Ht 60.6 in | Wt 166.0 lb

## 2021-11-16 DIAGNOSIS — E113593 Type 2 diabetes mellitus with proliferative diabetic retinopathy without macular edema, bilateral: Secondary | ICD-10-CM

## 2021-11-16 DIAGNOSIS — E1165 Type 2 diabetes mellitus with hyperglycemia: Secondary | ICD-10-CM | POA: Diagnosis not present

## 2021-11-16 DIAGNOSIS — Z794 Long term (current) use of insulin: Secondary | ICD-10-CM

## 2021-11-16 DIAGNOSIS — E785 Hyperlipidemia, unspecified: Secondary | ICD-10-CM | POA: Diagnosis not present

## 2021-11-16 DIAGNOSIS — R739 Hyperglycemia, unspecified: Secondary | ICD-10-CM

## 2021-11-16 LAB — POCT GLYCOSYLATED HEMOGLOBIN (HGB A1C): Hemoglobin A1C: 10.3 % — AB (ref 4.0–5.6)

## 2021-11-16 LAB — POCT GLUCOSE (DEVICE FOR HOME USE): Glucose Fasting, POC: 175 mg/dL — AB (ref 70–99)

## 2021-11-16 MED ORDER — FLUCONAZOLE 150 MG PO TABS: 150.0000 mg | ORAL_TABLET | Freq: Once | ORAL | 1 refills | Status: AC

## 2021-11-16 MED ORDER — TRESIBA FLEXTOUCH 100 UNIT/ML ~~LOC~~ SOPN: 20.0000 [IU] | PEN_INJECTOR | Freq: Every day | SUBCUTANEOUS | 6 refills | Status: DC

## 2021-11-16 NOTE — Patient Instructions (Signed)
-   Continue Jardiance 25 mg daily  ?- Increase Tresiba 20 units daily  ? ? ? ?HOW TO TREAT LOW BLOOD SUGARS (Blood sugar LESS THAN 70 MG/DL) ?Please follow the RULE OF 15 for the treatment of hypoglycemia treatment (when your (blood sugars are less than 70 mg/dL)  ? ?STEP 1: Take 15 grams of carbohydrates when your blood sugar is low, which includes:  ?3-4 GLUCOSE TABS  OR ?3-4 OZ OF JUICE OR REGULAR SODA OR ?ONE TUBE OF GLUCOSE GEL   ? ?STEP 2: RECHECK blood sugar in 15 MINUTES ?STEP 3: If your blood sugar is still low at the 15 minute recheck --> then, go back to STEP 1 and treat AGAIN with another 15 grams of carbohydrates. ? ?

## 2021-11-16 NOTE — Progress Notes (Signed)
?Name: Sharon Mcdonald  ?MRN/ DOB: [id number], 11/27/1955   ?Age/ Sex: 66 y.o., female   ? ?PCP: Glendale Chard, MD   ?Reason for Endocrinology Evaluation: Type 2 Diabetes Mellitus  ?   ?Date of Initial Endocrinology Visit: 03/09/2021  ? ? ?PATIENT IDENTIFIER: [name] is a 66 y.o. female with a past medical history of T2DM, HTN, and dyslipidemia. The patient presented for initial endocrinology clinic visit on 03/09/2021 for consultative assistance with her diabetes management.  ? ? ? ? ?HPI: ?Ms. Keysor was  ? ? ?Diagnosed with DM in 2003 ?Prior Medications tried/Intolerance: She was on Ozempic, basal insulin, and Synjardy without intolerance      ?Hemoglobin A1c has ranged from 6.3% in 2021, peaking at 12.3% in 2022. ? ?Mother and Brother with DM  ? 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 ? ?On her initial visit to our clinic she had an A1c of 10.0% . She was not on any glycemic agents for about a month before her presentation. We restarted Synjardy and started basal insulin  ? ? ?Intolerant to [place] due to diarrhea  ? ?SUBJECTIVE:  ? ?During the last visit (07/13/2021): 14% restarted Synjardy and started [place]  ? ? ? ?Today (11/16/21): Ms. Quarry is here for a follow up on diabetes management.   She checks her blood sugars 1 daily . The patient has not had hypoglycemic episodes since the last clinic visit ? ? ?Diarrhea has resolved  ?Denies nausea, or vomiting  ?Has vaginal itching  ? ? ? ?HOME DIABETES REGIMEN: ?Jardiance 25 mg daily  ?Tresiba 16 units daily  ? ? ? ? ?Statin: yes ?ACE-I/ARB: yes ?Prior Diabetic Education: yes  ? ? ?METER DOWNLOAD SUMMARY: Did not bing  ? ?DIABETIC COMPLICATIONS: ?Microvascular complications:  ? retinopathy (receives injection bilaterally ) ?Denies: CKD ?Last eye exam: Completed 01/2021 Dr. Katy Fitch  ? ?Macrovascular complications:  ? ?Denies: CAD, PVD, CVA ? ? ?PAST HISTORY: ?Past Medical History:  ?Past Medical History:  ?Diagnosis Date  ?  Diabetes mellitus   ? Diabetic retinopathy ([place])   ? High cholesterol   ? Hypertension   ? ?Past Surgical History:  ?Past Surgical History:  ?Procedure Laterality Date  ? BREAST CYST EXCISION Left   ? BREAST SURGERY    ? REDUCTION MAMMAPLASTY Bilateral   ? TONSILLECTOMY    ? TUBAL LIGATION    ?  ?Social History:  reports that she quit smoking about 43 years ago. Her smoking use included cigarettes. She has never used smokeless tobacco. She reports that she does not drink alcohol and does not use drugs. ?Family History:  ?Family History  ?Problem Relation Age of Onset  ? Diabetes Mother   ? Hypertension Mother   ? Obesity Mother   ? Diabetes Brother   ? ? ? ?HOME MEDICATIONS: ?Allergies as of 11/16/2021   ?No Known Allergies ?  ? ?  ?Medication List  ?  ? ?  ? Accurate as of Nov 16, 2021  7:37 AM. If you have any questions, ask your nurse or doctor.  ?  ?  ? ?  ? ?Accu-Chek Guide Me w/Device Kit ?Use to check blood sugar 3 times a day. Dx code e11.65 ?  ?atorvastatin 20 MG tablet ?Commonly known as: LIPITOR ?Take 1 tablet (20 mg total) by mouth daily. ?  ?dorzolamide-timolol 22.3-6.8 MG/ML ophthalmic solution ?Commonly known as: COSOPT ?1 drop 2 (two) times daily. ?  ?empagliflozin 25 MG Tabs tablet ?Commonly  known as: Jardiance ?Take 1 tablet (25 mg total) by mouth daily before breakfast. ?  ?gabapentin 300 MG capsule ?Commonly known as: NEURONTIN ?TAKE 1 CAPSULE BY MOUTH EVERY DAY ?  ?Insulin Pen Needle 32G X 4 MM Misc ?1 Device by Does not apply route daily in the afternoon. ?  ?lisinopril 40 MG tablet ?Commonly known as: ZESTRIL ?Take 1 tablet (40 mg total) by mouth daily. ?  ?OneTouch Verio test strip ?Generic drug: glucose blood ?1 each by Other route in the morning and at bedtime. Use as instructed ?  ?Tyler Aas FlexTouch 100 UNIT/ML FlexTouch Pen ?Generic drug: insulin degludec ?Inject 16 Units into the skin daily. ?  ? ?  ? ? ? ?ALLERGIES: ?No Known Allergies ? ? ?REVIEW OF SYSTEMS: ?A comprehensive ROS was  conducted with the patient and is negative except as per HPI  ? ?  ?OBJECTIVE:  ? ?VITAL SIGNS: BP 130/70 (BP Location: Left Arm, Patient Position: Sitting, Cuff Size: Large)   Pulse 95   Ht 5' 0.6" (1.539 m)   Wt 166 lb (75.3 kg)   SpO2 (!) 80%   BMI 31.78 kg/m?  ? ? ?PHYSICAL EXAM:  ?General: Pt appears well and is in NAD  ?Neck: General: Supple without adenopathy or carotid bruits. ?Thyroid: Thyroid size normal.  No goiter or nodules appreciated.   ?Lungs: Clear with good BS bilat with no rales, rhonchi, or wheezes  ?Heart: RRR   ?Abdomen: Normoactive bowel sounds, soft, nontender, without masses or organomegaly palpable  ?Extremities:  ?Lower extremities - No pretibial edema. No lesions.  ?Neuro: MS is good with appropriate affect, pt is alert and Ox3  ? ? ?DM foot exam: 03/09/2021 ? ?The skin of the feet is intact without sores or ulcerations. ?The pedal pulses are 2+ on right and 2+ on left. ?The sensation is intact to a screening 5.07, 10 gram monofilament bilaterally ? ? ?DATA REVIEWED: ? ?Lab Results  ?Component Value Date  ? HGBA1C 14.0 (A) 07/13/2021  ? HGBA1C 10.0 (H) 01/09/2021  ? HGBA1C 12.3 (H) 10/30/2020  ? ?Lab Results  ?Component Value Date  ? MICROALBUR 6.7 (H) 07/13/2021  ? LDLCALC 161 (H) 07/13/2021  ? CREATININE 0.71 07/13/2021  ? ?Lab Results  ?Component Value Date  ? MICRALBCREAT 23 08/15/2021  ? ? ?Lab Results  ?Component Value Date  ? CHOL 238 (H) 07/13/2021  ? HDL 56.50 07/13/2021  ? LDLCALC 161 (H) 07/13/2021  ? TRIG 103.0 07/13/2021  ? CHOLHDL 4 07/13/2021  ?     ? ?ASSESSMENT / PLAN / RECOMMENDATIONS:  ? ?1) Type 2 Diabetes Mellitus, Poorly controlled, With retinopathic complications - Most recent A1c of 10.3%. Goal A1c < 7.0 %.   ? ? ?- A1c trended down from 14.0%  ?- INtolerant to Ozempic due to reported stool incontinence ?- Intolerant to Metformin due to diarrhea ? ? ? ?MEDICATIONS: ?-Continue Jardiance 25 mg daily ?-Increase Tresiba 20 units daily ? ? ? ?EDUCATION /  INSTRUCTIONS: ?BG monitoring instructions: Patient is instructed to check glucose once a day ?I reviewed the Rule of 15 for the treatment of hypoglycemia in detail with the patient. Literature supplied. ? ? ?2) Diabetic complications:  ?Eye: Does  have known diabetic retinopathy.  ?Neuro/ Feet: Does not have known diabetic peripheral neuropathy. ?Renal: Patient does not have known baseline CKD. She is  on an ACEI/ARB at present. ? ? ?3) Dyslipidemia: ? ? ?-We switched  Pravastatin to atorvastatin 07/2021 ?- Tolerating well  ? ? ?Medication  ? ?  Continue  Atorvastatin 20 mg daily  ? ?4) Acute Vaginitis : ? ? ?- Pt to use fluconazole x1 but continue jardiance  ?-Patient understands that if she continues to have acute vaginitis then we will have to stop the Jardiance and find alternatives ? ? ? ? ?F/U in 4 months  ?Signed electronically by: ?Abby Nena Jordan, MD ? ?[facility]  ?[facility] ?[address] ?[place], [place] [address] ?Phone: [phone] ?FAX: [phone]  ? ?CC: ?Glendale Chard, MD ?[address] ?[place] [address] ?Phone: [phone]  ?Fax: [phone] ? ? ? ?Return to Endocrinology clinic as below: ?Future Appointments  ?Date Time Provider [place]  ?11/16/2021 10:10 AM Betzayda Braxton, Melanie Crazier, MD [facility] None  ?11/29/2021 11:00 AM Glendale Chard, MD [facility] None  ?08/07/2022  2:40 PM [facility] None  ?08/07/2022  3:20 PM Glendale Chard, MD [facility] None  ?  ? ?

## 2021-11-26 ENCOUNTER — Other Ambulatory Visit: Payer: Self-pay | Admitting: Internal Medicine

## 2021-11-29 ENCOUNTER — Encounter: Payer: Medicare PPO | Admitting: Internal Medicine

## 2022-02-09 ENCOUNTER — Other Ambulatory Visit: Payer: Self-pay | Admitting: Internal Medicine

## 2022-02-11 LAB — HM DIABETES EYE EXAM

## 2022-02-12 ENCOUNTER — Encounter: Payer: Self-pay | Admitting: Internal Medicine

## 2022-03-05 ENCOUNTER — Encounter: Payer: Self-pay | Admitting: Internal Medicine

## 2022-03-05 ENCOUNTER — Other Ambulatory Visit: Payer: Self-pay | Admitting: Internal Medicine

## 2022-03-05 MED ORDER — FLUCONAZOLE 150 MG PO TABS: 150.0000 mg | ORAL_TABLET | Freq: Once | ORAL | 0 refills | Status: AC

## 2022-03-08 ENCOUNTER — Other Ambulatory Visit: Payer: Self-pay | Admitting: Internal Medicine

## 2022-03-08 ENCOUNTER — Telehealth: Payer: Self-pay | Admitting: Internal Medicine

## 2022-03-08 MED ORDER — TRESIBA FLEXTOUCH 100 UNIT/ML ~~LOC~~ SOPN: 25.0000 [IU] | PEN_INJECTOR | Freq: Every day | SUBCUTANEOUS | 3 refills | Status: DC

## 2022-03-08 MED ORDER — GLIPIZIDE 5 MG PO TABS: 5.0000 mg | ORAL_TABLET | Freq: Every day | ORAL | 6 refills | Status: DC

## 2022-03-08 NOTE — Telephone Encounter (Signed)
Spoke to the patient on 03/08/2022 at 1600    I had stopped her [place] couple days ago, she received a call from the pharmacist asking about Synjardy  Patient is intolerant to [place] Patient is intolerant to Jardiance due to recurrent yeast infections    Her BG's are around 170 mg/DL   I have asked the patient to increase Tresiba to 25 units daily  I also have asked her that if her BG's remain above 150 mg/DL, to start taking glipizide 1 tablet before first meal of the day  I did caution the patient against hypoglycemia and she needs to be more observant of her BG readings due to the combination of glipizide and insulin, but unfortunately we are running out of options as she is intolerant to GLP-1 agonist and SGLT2 inhibitors.  The last resort is to start her on prandial dose of insulin    Patient expressed understanding

## 2022-03-08 NOTE — Telephone Encounter (Signed)
Tobi Bastos from [facility] is calling stating that patient is concern because she doesn't have any medication. Jardiance was stopped and patient doesn't want to do the [place].

## 2022-03-29 ENCOUNTER — Encounter: Payer: Self-pay | Admitting: Internal Medicine

## 2022-03-29 ENCOUNTER — Ambulatory Visit (INDEPENDENT_AMBULATORY_CARE_PROVIDER_SITE_OTHER): Payer: Medicare Other | Admitting: Internal Medicine

## 2022-03-29 VITALS — BP 128/82 | HR 75 | Ht 60.06 in | Wt 173.6 lb

## 2022-03-29 DIAGNOSIS — Z794 Long term (current) use of insulin: Secondary | ICD-10-CM

## 2022-03-29 DIAGNOSIS — E113593 Type 2 diabetes mellitus with proliferative diabetic retinopathy without macular edema, bilateral: Secondary | ICD-10-CM | POA: Diagnosis not present

## 2022-03-29 DIAGNOSIS — E1165 Type 2 diabetes mellitus with hyperglycemia: Secondary | ICD-10-CM | POA: Diagnosis not present

## 2022-03-29 DIAGNOSIS — E785 Hyperlipidemia, unspecified: Secondary | ICD-10-CM

## 2022-03-29 LAB — POCT GLYCOSYLATED HEMOGLOBIN (HGB A1C): Hemoglobin A1C: 9.4 % — AB (ref 4.0–5.6)

## 2022-03-29 LAB — LIPID PANEL
Cholesterol: 167 mg/dL (ref 0–200)
HDL: 55.5 mg/dL (ref >39.00)
LDL Cholesterol: 102 mg/dL — ABNORMAL HIGH (ref 0–99)
NonHDL: 111.69
Total CHOL/HDL Ratio: 3
Triglycerides: 48 mg/dL (ref 0.0–149.0)
VLDL: 9.6 mg/dL (ref 0.0–40.0)

## 2022-03-29 LAB — COMPREHENSIVE METABOLIC PANEL
ALT: 25 U/L (ref 0–35)
AST: 24 U/L (ref 0–37)
Albumin: 3.9 g/dL (ref 3.5–5.2)
Alkaline Phosphatase: 64 U/L (ref 39–117)
BUN: 13 mg/dL (ref 6–23)
CO2: 28 mEq/L (ref 19–32)
Calcium: 9.6 mg/dL (ref 8.4–10.5)
Chloride: 105 mEq/L (ref 96–112)
Creatinine, Ser: 0.68 mg/dL (ref 0.40–1.20)
GFR: 90.58 mL/min (ref >60.00)
Glucose, Bld: 187 mg/dL — ABNORMAL HIGH (ref 70–99)
Potassium: 4.3 mEq/L (ref 3.5–5.1)
Sodium: 139 mEq/L (ref 135–145)
Total Bilirubin: 0.5 mg/dL (ref 0.2–1.2)
Total Protein: 7.2 g/dL (ref 6.0–8.3)

## 2022-03-29 MED ORDER — GLIPIZIDE 5 MG PO TABS: 5.0000 mg | ORAL_TABLET | Freq: Two times a day (BID) | ORAL | 1 refills | Status: DC

## 2022-03-29 MED ORDER — TIRZEPATIDE 2.5 MG/0.5ML ~~LOC~~ SOAJ: 2.5000 mg | SUBCUTANEOUS | 2 refills | Status: DC

## 2022-03-29 MED ORDER — ATORVASTATIN CALCIUM 20 MG PO TABS: 20.0000 mg | ORAL_TABLET | Freq: Every day | ORAL | 3 refills | Status: DC

## 2022-03-29 NOTE — Patient Instructions (Signed)
-   Glipizide 5 mg 1 tablet before Breakfast and 1 tablet before Supper ( if you start Mounjaro do NOT take the Supper dose but continue with the breakfast dose  - Continue Tresiba 25 units daily  - Start Mounjaro 2.5 mg once weekly     HOW TO TREAT LOW BLOOD SUGARS (Blood sugar LESS THAN 70 MG/DL) Please follow the RULE OF 15 for the treatment of hypoglycemia treatment (when your (blood sugars are less than 70 mg/dL)   STEP 1: Take 15 grams of carbohydrates when your blood sugar is low, which includes:  3-4 GLUCOSE TABS  OR 3-4 OZ OF JUICE OR REGULAR SODA OR ONE TUBE OF GLUCOSE GEL    STEP 2: RECHECK blood sugar in 15 MINUTES STEP 3: If your blood sugar is still low at the 15 minute recheck --> then, go back to STEP 1 and treat AGAIN with another 15 grams of carbohydrates.

## 2022-03-29 NOTE — Progress Notes (Signed)
Name: Sharon Mcdonald  MRN/ DOB: [id number], 03/26/1956   Age/ Sex: 66 y.o., female    PCP: Sanders, Robyn, MD   Reason for Endocrinology Evaluation: Type 2 Diabetes Mellitus     Date of Initial Endocrinology Visit: 03/09/2021    PATIENT IDENTIFIER: [name] is a 66 y.o. female with a past medical history of T2DM, HTN, and dyslipidemia. The patient presented for initial endocrinology clinic visit on 03/09/2021 for consultative assistance with her diabetes management.      HPI: [name] was    Diagnosed with DM in 2003 Prior Medications tried/Intolerance: She was on Ozempic, basal insulin, and Synjardy without intolerance      Hemoglobin A1c has ranged from 6.3% in 2021, peaking at 12.3% in 2022.  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 She was not on any glycemic agents for about a month before her presentation. We restarted Synjardy and started basal insulin    Intolerant to Synjardy due to diarrhea   Jardiance was stopped September 2023 due to recurrent vaginitis, she was started on glipizide   SUBJECTIVE:   During the last visit (07/13/2021): 14% restarted Synjardy and started Tresiba     Today (03/29/22): Ms. [name] is here for a follow up on diabetes management.   She checks her blood sugars 1 daily . The patient has not had hypoglycemic episodes since the last clinic visit  Denies nausea vomiting or diarrhea  Denies vaginitis  Vision stable     HOME DIABETES REGIMEN: Glipizide 5 mg daily Tresiba 25 units daily    Statin: yes ACE-I/ARB: yes Prior Diabetic Education: yes    METER DOWNLOAD SUMMARY: unable to download  99 -219 mg/dL     DIABETIC COMPLICATIONS: Microvascular complications:   retinopathy (receives injection bilaterally ) Denies: CKD Last eye exam: Completed 02/2022 Dr. Groat , Dr Sanders next Friday    Macrovascular complications:   Denies: CAD, PVD, CVA   PAST HISTORY: Past Medical History:  Past Medical History:  Diagnosis Date   Diabetes mellitus    Diabetic retinopathy ([facility])    High cholesterol    Hypertension    Past Surgical History:  Past Surgical History:  Procedure Laterality Date   BREAST CYST EXCISION Left    BREAST SURGERY     REDUCTION MAMMAPLASTY Bilateral    TONSILLECTOMY     TUBAL LIGATION      Social History:  reports that she quit smoking about 43 years ago. Her smoking use included cigarettes. She has never used smokeless tobacco. She reports that she does not drink alcohol and does not use drugs. Family History:  Family History  Problem Relation Age of Onset   Diabetes Mother    Hypertension Mother    Obesity Mother    Diabetes Brother      HOME MEDICATIONS: Allergies as of 03/29/2022   No Known Allergies      Medication List        Accurate as of March 29, 2022  9:48 AM. If you have any questions, ask your nurse or doctor.          Accu-Chek Guide Me w/Device Kit Use to check blood sugar 3 times a day. Dx code e11.65   Accu-Chek Guide test strip Generic drug: glucose blood USE TO CHECK BLOOD SUGARS 2 TIMES DAILY. DX CODE:E11.65     Accu-Chek Softclix Lancets lancets USE TO CHECK BLOOD SUGARS 2 TIMES DAILY. DX CODE:E11.65   atorvastatin 20 MG tablet Commonly known as: LIPITOR Take 1 tablet (20 mg total) by mouth daily.   dorzolamide-timolol 22.3-6.8 MG/ML ophthalmic solution Commonly known as: COSOPT 1 drop 2 (two) times daily.   gabapentin 300 MG capsule Commonly known as: NEURONTIN TAKE 1 CAPSULE BY MOUTH EVERY DAY   glipiZIDE 5 MG tablet Commonly known as: GLUCOTROL Take 1 tablet (5 mg total) by mouth daily before breakfast.   Insulin Pen Needle 32G X 4 MM Misc 1 Device by Does not apply route daily in the afternoon.   lisinopril 40 MG tablet Commonly known as: ZESTRIL Take 1 tablet (40 mg total) by mouth  daily.   meloxicam 15 MG tablet Commonly known as: MOBIC Take 15 mg by mouth daily.   Tyler Aas FlexTouch 100 UNIT/ML FlexTouch Pen Generic drug: insulin degludec Inject 25 Units into the skin daily.         ALLERGIES: No Known Allergies   REVIEW OF SYSTEMS: A comprehensive ROS was conducted with the patient and is negative except as per HPI     OBJECTIVE:   VITAL SIGNS: BP 128/82 (BP Location: Right Arm, Patient Position: Sitting, Cuff Size: Normal)   Pulse 75   Ht 5' 0.06" (1.526 m)   Wt 173 lb 9.6 oz (78.7 kg)   SpO2 91%   BMI 33.84 kg/m    PHYSICAL EXAM:  General: Pt appears well and is in NAD  Neck: General: Supple without adenopathy or carotid bruits. Thyroid: Thyroid size normal.  No goiter or nodules appreciated.   Lungs: Clear with good BS bilat with no rales, rhonchi, or wheezes  Heart: RRR   Abdomen: Normoactive bowel sounds, soft, nontender, without masses or organomegaly palpable  Extremities:  Lower extremities - No pretibial edema.   Neuro: MS is good with appropriate affect, pt is alert and Ox3    DM foot exam: 03/29/2022  The skin of the feet is intact without sores or ulcerations. The pedal pulses are 2+ on right and 2+ on left. The sensation is intact to a screening 5.07, 10 gram monofilament bilaterally   DATA REVIEWED:  Lab Results  Component Value Date   HGBA1C 9.4 (A) 03/29/2022   HGBA1C 10.3 (A) 11/16/2021   HGBA1C 14.0 (A) 07/13/2021    Latest Reference Range & Units 03/29/22 10:02  Sodium 135 - 145 mEq/L 139  Potassium 3.5 - 5.1 mEq/L 4.3  Chloride 96 - 112 mEq/L 105  CO2 19 - 32 mEq/L 28  Glucose 70 - 99 mg/dL 187 (H)  BUN 6 - 23 mg/dL 13  Creatinine 0.40 - 1.20 mg/dL 0.68  Calcium 8.4 - 10.5 mg/dL 9.6  Alkaline Phosphatase 39 - 117 U/L 64  Albumin 3.5 - 5.2 g/dL 3.9  AST 0 - 37 U/L 24  ALT 0 - 35 U/L 25  Total Protein 6.0 - 8.3 g/dL 7.2  Total Bilirubin 0.2 - 1.2 mg/dL 0.5  GFR >60.00 mL/min 90.58  Total CHOL/HDL  Ratio  3  Cholesterol 0 - 200 mg/dL 167  HDL Cholesterol >39.00 mg/dL 55.50  LDL (calc) 0 - 99 mg/dL 102 (H)  NonHDL  111.69    ASSESSMENT / PLAN / RECOMMENDATIONS:   1) Type 2 Diabetes Mellitus, Poorly controlled, With retinopathic complications - Most recent A1c of 9.4 %. Goal A1c < 7.0 %.    -Her A1c has been gradually trending down, but she understands this remains  above goal - INtolerant to Ozempic due to reported stool incontinence, but she is interested in trying Mounjaro, she understands similar side effects could occur - Intolerant to Metformin due to diarrhea -Intolerant to Jardiance due to recurrent vaginal infections -She is tolerating glipizide in combination with Tresiba without hypoglycemia, my plan is to increase glipizide as below, but I did advise the patient that if she happens to be able to start Mounjaro then she should not increase the glipizide to twice daily and remain on once daily -We did discuss the risk of hypoglycemia as Mounjaro may improve insulin sensitivity  MEDICATIONS: -Increase glipizide 5 mg twice daily -Continue Tresiba 25 units daily -Start Mounjaro 2.5 mg weekly    EDUCATION / INSTRUCTIONS: BG monitoring instructions: Patient is instructed to check glucose once a day I reviewed the Rule of 15 for the treatment of hypoglycemia in detail with the patient. Literature supplied.   2) Diabetic complications:  Eye: Does  have known diabetic retinopathy.  Neuro/ Feet: Does not have known diabetic peripheral neuropathy. Renal: Patient does not have known baseline CKD. She is  on an ACEI/ARB at present.   3) Dyslipidemia:   -We switched  Pravastatin to atorvastatin 07/2021 -LDL has been decreased from 161 mg/DL to 102 mg/DL -No change   Medication   Continue  Atorvastatin 20 mg daily       F/U in 4 months  Signed electronically by: Abby Jaralla Shamleffer, MD  [facility]  [facility] [address]  [address] [place], [place] [address] Phone: [phone] FAX: [phone]   CC: Sanders, Robyn, MD [address] [place] [address] Phone: [phone]  Fax: [phone]    Return to Endocrinology clinic as below: Future Appointments  Date Time Provider Department Center  08/07/2022  2:40 PM [facility] None  08/07/2022  3:20 PM Sanders, Robyn, MD [facility] None    

## 2022-04-03 ENCOUNTER — Other Ambulatory Visit: Payer: Self-pay | Admitting: Internal Medicine

## 2022-04-04 ENCOUNTER — Other Ambulatory Visit (HOSPITAL_COMMUNITY): Payer: Self-pay

## 2022-04-12 ENCOUNTER — Other Ambulatory Visit: Payer: Self-pay | Admitting: Internal Medicine

## 2022-04-16 NOTE — Telephone Encounter (Signed)
Detalied vm left for patient.

## 2022-05-08 ENCOUNTER — Ambulatory Visit: Payer: Medicare Other | Admitting: Internal Medicine

## 2022-05-16 ENCOUNTER — Encounter: Payer: Self-pay | Admitting: Internal Medicine

## 2022-05-21 ENCOUNTER — Encounter: Payer: Self-pay | Admitting: Internal Medicine

## 2022-05-21 ENCOUNTER — Other Ambulatory Visit: Payer: Self-pay | Admitting: Internal Medicine

## 2022-05-21 DIAGNOSIS — E1165 Type 2 diabetes mellitus with hyperglycemia: Secondary | ICD-10-CM

## 2022-05-21 MED ORDER — TRESIBA FLEXTOUCH 100 UNIT/ML ~~LOC~~ SOPN: 30.0000 [IU] | PEN_INJECTOR | Freq: Every day | SUBCUTANEOUS | 3 refills | Status: DC

## 2022-06-14 DIAGNOSIS — E133511 Other specified diabetes mellitus with proliferative diabetic retinopathy with macular edema, right eye: Secondary | ICD-10-CM | POA: Diagnosis not present

## 2022-07-09 ENCOUNTER — Ambulatory Visit (INDEPENDENT_AMBULATORY_CARE_PROVIDER_SITE_OTHER): Payer: Medicare Other | Admitting: Internal Medicine

## 2022-07-09 ENCOUNTER — Encounter: Payer: Self-pay | Admitting: Internal Medicine

## 2022-07-09 VITALS — BP 150/90 | HR 106 | Temp 99.3°F | Ht 60.6 in | Wt 176.0 lb

## 2022-07-09 DIAGNOSIS — R058 Other specified cough: Secondary | ICD-10-CM | POA: Diagnosis not present

## 2022-07-09 DIAGNOSIS — N189 Chronic kidney disease, unspecified: Secondary | ICD-10-CM

## 2022-07-09 DIAGNOSIS — E113593 Type 2 diabetes mellitus with proliferative diabetic retinopathy without macular edema, bilateral: Secondary | ICD-10-CM | POA: Diagnosis not present

## 2022-07-09 DIAGNOSIS — E1122 Type 2 diabetes mellitus with diabetic chronic kidney disease: Secondary | ICD-10-CM | POA: Diagnosis not present

## 2022-07-09 DIAGNOSIS — I129 Hypertensive chronic kidney disease with stage 1 through stage 4 chronic kidney disease, or unspecified chronic kidney disease: Secondary | ICD-10-CM

## 2022-07-09 DIAGNOSIS — E1169 Type 2 diabetes mellitus with other specified complication: Secondary | ICD-10-CM | POA: Diagnosis not present

## 2022-07-09 DIAGNOSIS — R051 Acute cough: Secondary | ICD-10-CM

## 2022-07-09 DIAGNOSIS — J029 Acute pharyngitis, unspecified: Secondary | ICD-10-CM | POA: Diagnosis not present

## 2022-07-09 DIAGNOSIS — R0981 Nasal congestion: Secondary | ICD-10-CM | POA: Diagnosis not present

## 2022-07-09 DIAGNOSIS — R0989 Other specified symptoms and signs involving the circulatory and respiratory systems: Secondary | ICD-10-CM | POA: Diagnosis not present

## 2022-07-09 DIAGNOSIS — E1159 Type 2 diabetes mellitus with other circulatory complications: Secondary | ICD-10-CM | POA: Diagnosis not present

## 2022-07-09 DIAGNOSIS — I152 Hypertension secondary to endocrine disorders: Secondary | ICD-10-CM | POA: Diagnosis not present

## 2022-07-09 DIAGNOSIS — E6609 Other obesity due to excess calories: Secondary | ICD-10-CM | POA: Diagnosis not present

## 2022-07-09 DIAGNOSIS — Z794 Long term (current) use of insulin: Secondary | ICD-10-CM | POA: Diagnosis not present

## 2022-07-09 DIAGNOSIS — Z6833 Body mass index (BMI) 33.0-33.9, adult: Secondary | ICD-10-CM

## 2022-07-09 DIAGNOSIS — E669 Obesity, unspecified: Secondary | ICD-10-CM

## 2022-07-09 LAB — POC INFLUENZA A&B (BINAX/QUICKVUE)
Influenza A, POC: POSITIVE — AB
Influenza B, POC: NEGATIVE

## 2022-07-09 LAB — POCT RAPID STREP A (OFFICE): Rapid Strep A Screen: NEGATIVE

## 2022-07-09 LAB — POC COVID19 BINAXNOW: SARS Coronavirus 2 Ag: NEGATIVE

## 2022-07-09 MED ORDER — OSELTAMIVIR PHOSPHATE 75 MG PO CAPS: 75.0000 mg | ORAL_CAPSULE | Freq: Two times a day (BID) | ORAL | 0 refills | Status: AC

## 2022-07-09 MED ORDER — HYDROCODONE BIT-HOMATROP MBR 5-1.5 MG/5ML PO SOLN: 5.0000 mL | Freq: Four times a day (QID) | ORAL | 0 refills | Status: DC | PRN

## 2022-07-09 NOTE — Progress Notes (Addendum)
Rich Brave Llittleton,acting as a Education administrator for [name], MD.,have documented all relevant documentation on the behalf of [name], MD,as directed by  [name], MD while in the presence of [name], MD.    Subjective:     Patient ID: Sharon Mcdonald , female    DOB: 09/03/1955 , 67 y.o.   MRN: [id number]   Chief Complaint  Patient presents with   URI    HPI  Patient presents today for cold symptoms. She reports her symptoms started on Saturday. She started "feeling bad" on Saturday just prior to going to the movies. She had a sore throat. She admits to associated back pain. Denies fever/chills. Does admit to slight headache. Did not do any home COVID testing. She has tried Chloraseptic throat spray and diabetic Tussin without relief of her sx. She never tried any Tylenol.   URI  This is a new problem. The current episode started in the past 7 days. There has been no fever. Associated symptoms include congestion, coughing, headaches and a sore throat. She has tried decongestant for the symptoms. The treatment provided no relief.     Past Medical History:  Diagnosis Date   Diabetes mellitus    Diabetic retinopathy ([place])    High cholesterol    Hypertension      Family History  Problem Relation Age of Onset   Diabetes Mother    Hypertension Mother    Obesity Mother    Diabetes Brother      Current Outpatient Medications:    ACCU-CHEK GUIDE test strip, USE TO CHECK BLOOD SUGARS 2 TIMES DAILY. DX CODE:E11.65, Disp: 100 strip, Rfl: 11   Accu-Chek Softclix Lancets lancets, USE TO CHECK BLOOD SUGARS 2 TIMES DAILY. DX CODE:E11.65, Disp: 100 each, Rfl: 11   atorvastatin (LIPITOR) 20 MG tablet, Take 1 tablet (20 mg total) by mouth daily., Disp: 90 tablet, Rfl: 3   BD PEN NEEDLE NANO 2ND GEN 32G X 4 MM MISC, USE DAILY IN THE AFTERNOON, Disp: 100 each, Rfl: 3   Blood Glucose Monitoring Suppl (ACCU-CHEK GUIDE ME) w/Device KIT, Use to check blood sugar 3 times a  day. Dx code e11.65, Disp: 1 kit, Rfl: 1   dorzolamide-timolol (COSOPT) 22.3-6.8 MG/ML ophthalmic solution, 1 drop 2 (two) times daily., Disp: , Rfl:    gabapentin (NEURONTIN) 300 MG capsule, TAKE 1 CAPSULE BY MOUTH EVERY DAY, Disp: 90 capsule, Rfl: 1   glipiZIDE (GLUCOTROL) 5 MG tablet, Take 1 tablet (5 mg total) by mouth 2 (two) times daily before a meal., Disp: 180 tablet, Rfl: 1   insulin degludec (TRESIBA FLEXTOUCH) 100 UNIT/ML FlexTouch Pen, Inject 30 Units into the skin daily., Disp: 30 mL, Rfl: 3   lisinopril (ZESTRIL) 40 MG tablet, TAKE 1 TABLET BY MOUTH EVERY DAY, Disp: 90 tablet, Rfl: 3   meloxicam (MOBIC) 15 MG tablet, Take 15 mg by mouth daily., Disp: , Rfl:    tirzepatide (MOUNJARO) 2.5 MG/0.5ML Pen, Inject 2.5 mg into the skin once a week., Disp: 6 mL, Rfl: 2   albuterol (VENTOLIN HFA) 108 (90 Base) MCG/ACT inhaler, Inhale 2 puffs into the lungs every 6 (six) hours as needed for wheezing or shortness of breath., Disp: 8 g, Rfl: 2   amoxicillin-clavulanate (AUGMENTIN) 875-125 MG tablet, Take 1 tablet by mouth 2 (two) times daily., Disp: 20 tablet, Rfl: 0   HYDROcodone bit-homatropine (HYDROMET) 5-1.5 MG/5ML syrup, Take 5 mLs by mouth every 6 (six) hours as needed., Disp: 120  mL, Rfl: 0   No Known Allergies   Review of Systems  Constitutional: Negative.   HENT:  Positive for congestion and sore throat.   Respiratory:  Positive for cough.   Cardiovascular: Negative.   Gastrointestinal: Negative.   Neurological:  Positive for headaches.  Psychiatric/Behavioral: Negative.       Today's Vitals   07/09/22 1415  BP: (!) 150/90  Pulse: (!) 106  Temp: 99.3 F (37.4 C)  Weight: 176 lb (79.8 kg)  Height: 5' 0.6" (1.539 m)  PainSc: 10-Worst pain ever  PainLoc: Throat   Body mass index is 33.7 kg/m.  Wt Readings from Last 3 Encounters:  07/15/22 176 lb (79.8 kg)  07/09/22 176 lb (79.8 kg)  03/29/22 173 lb 9.6 oz (78.7 kg)     Objective:  Physical Exam Vitals and nursing  note reviewed.  Constitutional:      Appearance: Normal appearance. She is ill-appearing.  HENT:     Head: Normocephalic and atraumatic.     Right Ear: Tympanic membrane, ear canal and external ear normal. There is no impacted cerumen.     Left Ear: Tympanic membrane, ear canal and external ear normal. There is no impacted cerumen.     Nose:     Comments: Masked     Mouth/Throat:     Comments: Masked  Eyes:     Extraocular Movements: Extraocular movements intact.  Cardiovascular:     Rate and Rhythm: Normal rate and regular rhythm.     Heart sounds: Normal heart sounds.  Pulmonary:     Effort: Pulmonary effort is normal.     Breath sounds: Normal breath sounds.  Musculoskeletal:     Cervical back: Normal range of motion.  Skin:    General: Skin is warm.  Neurological:     General: No focal deficit present.     Mental Status: She is alert.  Psychiatric:        Mood and Affect: Mood normal.        Behavior: Behavior normal.         Assessment And Plan:     1. Acute cough Comments: Flu A POS(faint), Flu B negative. Rapid COVID negative. I will also send resp panel. She was sent rx Tamiflu $RemoveBe'75mg'[id number]$  twice daily. - POC Influenza A&B(BINAX/QUICKVUE) - POC COVID-19 - Respiratory Panel w/ SARS-CoV2  2. Sore throat Comments: Strep test negative. Advised to take Tylenol prn, may benefit from salt water gargles. - POCT rapid strep A - Respiratory Panel w/ SARS-CoV2  3. Obesity, diabetes, and hypertension syndrome ([place]) Comments: Chronic, uncontrolled -- likely exacerbated by acute illness. I will not make any changes today, encouraged to avoid OTC decongestants.  4. Class 1 obesity due to excess calories with serious comorbidity and body mass index (BMI) of 33.0 to 33.9 in adult Comments: She is encouraged to aim for at least 150 minutes of exercise/week, while striving for BMI<30 to decrease cardiac risk.  5. Type 2 diabetes mellitus with proliferative retinopathy of both eyes,  with long-term current use of insulin, macular edema presence unspecified, unspecified proliferative retinopathy type ([place]) Comments: Chronic,she is followed by Ophthalmology. She reports previous compliance with ocular injections.  She is also followed by Endo, importance of medication/dietary compliance was stressed to patient.    Patient was given opportunity to ask questions. Patient verbalized understanding of the plan and was able to repeat key elements of the plan. All questions were answered to their satisfaction.   I, [name], MD,  have reviewed all documentation for this visit. The documentation on 07/09/22 for the exam, diagnosis, procedures, and orders are all accurate and complete.   IF YOU HAVE BEEN REFERRED TO A SPECIALIST, IT MAY TAKE 1-2 WEEKS TO SCHEDULE/PROCESS THE REFERRAL. IF YOU HAVE NOT HEARD FROM US/SPECIALIST IN TWO WEEKS, PLEASE GIVE [place] A CALL AT [phone].   THE PATIENT IS ENCOURAGED TO PRACTICE SOCIAL DISTANCING DUE TO THE COVID-19 PANDEMIC.

## 2022-07-09 NOTE — Patient Instructions (Signed)

## 2022-07-10 ENCOUNTER — Ambulatory Visit: Payer: Medicare Other | Admitting: Internal Medicine

## 2022-07-11 LAB — RESPIRATORY PANEL W/ SARS-COV2
Adenovirus: NOT DETECTED
Bordetella parapertussis: NOT DETECTED
Bordetella pertussis: NOT DETECTED
Chlamydophila pneumoniae: NOT DETECTED
Coronavirus 229E: NOT DETECTED
Coronavirus HKU1: NOT DETECTED
Coronavirus NL63: NOT DETECTED
Coronavirus OC43: NOT DETECTED
Human Metapneumovirus: DETECTED — AB
Human Rhinovirus/Enterovirus: NOT DETECTED
Influenza A/H1-2009: NOT DETECTED
Influenza A/H1: NOT DETECTED
Influenza A/H3: NOT DETECTED
Influenza A: NOT DETECTED
Influenza B: NOT DETECTED
Mycoplasma pneumoniae: NOT DETECTED
Parainfluenza 1: NOT DETECTED
Parainfluenza 2: NOT DETECTED
Parainfluenza 3: NOT DETECTED
Parainfluenza 4: NOT DETECTED
Respiratory Syncytial Virus: NOT DETECTED
SARS-CoV-2: NOT DETECTED

## 2022-07-12 ENCOUNTER — Encounter: Payer: Self-pay | Admitting: Internal Medicine

## 2022-07-12 ENCOUNTER — Other Ambulatory Visit: Payer: Self-pay | Admitting: Internal Medicine

## 2022-07-12 MED ORDER — AMOXICILLIN-POT CLAVULANATE 875-125 MG PO TABS: 1.0000 | ORAL_TABLET | Freq: Two times a day (BID) | ORAL | 0 refills | Status: DC

## 2022-07-13 ENCOUNTER — Encounter: Payer: Self-pay | Admitting: Internal Medicine

## 2022-07-14 ENCOUNTER — Other Ambulatory Visit: Payer: Self-pay | Admitting: Internal Medicine

## 2022-07-14 MED ORDER — HYDROCODONE BIT-HOMATROP MBR 5-1.5 MG/5ML PO SOLN: 5.0000 mL | Freq: Four times a day (QID) | ORAL | 0 refills | Status: DC | PRN

## 2022-07-15 ENCOUNTER — Encounter: Payer: Self-pay | Admitting: Internal Medicine

## 2022-07-15 ENCOUNTER — Ambulatory Visit
Admission: RE | Admit: 2022-07-15 | Discharge: 2022-07-15 | Disposition: A | Discharge status: Home / Self Care | Payer: Medicare Other | Source: Ambulatory Visit | Attending: Internal Medicine | Admitting: Internal Medicine

## 2022-07-15 ENCOUNTER — Other Ambulatory Visit (HOSPITAL_COMMUNITY): Payer: Self-pay

## 2022-07-15 ENCOUNTER — Ambulatory Visit (INDEPENDENT_AMBULATORY_CARE_PROVIDER_SITE_OTHER): Payer: Medicare Other | Admitting: Internal Medicine

## 2022-07-15 VITALS — BP 124/80 | HR 100 | Temp 98.5°F | Ht 60.6 in | Wt 176.0 lb

## 2022-07-15 DIAGNOSIS — J069 Acute upper respiratory infection, unspecified: Secondary | ICD-10-CM

## 2022-07-15 DIAGNOSIS — R059 Cough, unspecified: Secondary | ICD-10-CM | POA: Diagnosis not present

## 2022-07-15 DIAGNOSIS — E6609 Other obesity due to excess calories: Secondary | ICD-10-CM

## 2022-07-15 MED ORDER — CEFTRIAXONE SODIUM 500 MG IJ SOLR: 500.0000 mg | Freq: Once | INTRAMUSCULAR | Status: DC

## 2022-07-15 MED ORDER — TRIAMCINOLONE ACETONIDE 40 MG/ML IJ SUSP
40.0000 mg | Freq: Once | INTRAMUSCULAR | Status: AC
  Administered 2022-07-15: 40 mg via INTRAMUSCULAR

## 2022-07-15 MED ORDER — CEFTRIAXONE SODIUM 500 MG IJ SOLR: 1000.0000 mg | Freq: Once | INTRAMUSCULAR | Status: DC

## 2022-07-15 MED ORDER — HYDROCODONE BIT-HOMATROP MBR 5-1.5 MG/5ML PO SOLN
5.0000 mL | Freq: Four times a day (QID) | ORAL | 0 refills | Status: DC | PRN
  Filled 2022-07-15: qty 100, 5d supply, fill #0

## 2022-07-15 MED ORDER — ALBUTEROL SULFATE HFA 108 (90 BASE) MCG/ACT IN AERS: 2.0000 | INHALATION_SPRAY | Freq: Four times a day (QID) | RESPIRATORY_TRACT | 2 refills | Status: DC | PRN

## 2022-07-15 NOTE — Progress Notes (Signed)
Rich Brave [place],acting as a Education administrator for [name], MD.,have documented all relevant documentation on the behalf of [name], MD,as directed by  [name], MD while in the presence of [name], MD.    Subjective:     Patient ID: Sharon Mcdonald , female    DOB: [birth year] , 67 y.o.   MRN: [id number]   No chief complaint on file.   HPI  Patient presents today for a follow up. She is still coughing a lot. She reports she has not been able to sleep due to the cough.  She has taken Tamiflu. She came in today for further evaluation of persistent symptoms. She has had no relief w/ previous treatments thus far.      Past Medical History:  Diagnosis Date   Diabetes mellitus    Diabetic retinopathy ([place])    High cholesterol    Hypertension      Family History  Problem Relation Age of Onset   Diabetes Mother    Hypertension Mother    Obesity Mother    Diabetes Brother      Current Outpatient Medications:    ACCU-CHEK GUIDE test strip, USE TO CHECK BLOOD SUGARS 2 TIMES DAILY. DX CODE:E11.65, Disp: 100 strip, Rfl: 11   Accu-Chek Softclix Lancets lancets, USE TO CHECK BLOOD SUGARS 2 TIMES DAILY. DX CODE:E11.65, Disp: 100 each, Rfl: 11   albuterol (VENTOLIN HFA) 108 (90 Base) MCG/ACT inhaler, Inhale 2 puffs into the lungs every 6 (six) hours as needed for wheezing or shortness of breath., Disp: 8 g, Rfl: 2   amoxicillin-clavulanate (AUGMENTIN) 875-125 MG tablet, Take 1 tablet by mouth 2 (two) times daily., Disp: 20 tablet, Rfl: 0   atorvastatin (LIPITOR) 20 MG tablet, Take 1 tablet (20 mg total) by mouth daily., Disp: 90 tablet, Rfl: 3   BD PEN NEEDLE NANO 2ND GEN 32G X 4 MM MISC, USE DAILY IN THE AFTERNOON, Disp: 100 each, Rfl: 3   Blood Glucose Monitoring Suppl (ACCU-CHEK GUIDE ME) w/Device KIT, Use to check blood sugar 3 times a day. Dx code e11.65, Disp: 1 kit, Rfl: 1   dorzolamide-timolol (COSOPT) 22.3-6.8 MG/ML ophthalmic solution, 1 drop 2 (two)  times daily., Disp: , Rfl:    gabapentin (NEURONTIN) 300 MG capsule, TAKE 1 CAPSULE BY MOUTH EVERY DAY, Disp: 90 capsule, Rfl: 1   glipiZIDE (GLUCOTROL) 5 MG tablet, Take 1 tablet (5 mg total) by mouth 2 (two) times daily before a meal., Disp: 180 tablet, Rfl: 1   HYDROcodone bit-homatropine (HYDROMET) 5-1.5 MG/5ML syrup, Take 5 mLs by mouth every 6 (six) hours as needed., Disp: 120 mL, Rfl: 0   insulin degludec (TRESIBA FLEXTOUCH) 100 UNIT/ML FlexTouch Pen, Inject 30 Units into the skin daily., Disp: 30 mL, Rfl: 3   lisinopril (ZESTRIL) 40 MG tablet, TAKE 1 TABLET BY MOUTH EVERY DAY, Disp: 90 tablet, Rfl: 3   meloxicam (MOBIC) 15 MG tablet, Take 15 mg by mouth daily., Disp: , Rfl:    tirzepatide (MOUNJARO) 2.5 MG/0.5ML Pen, Inject 2.5 mg into the skin once a week., Disp: 6 mL, Rfl: 2   No Known Allergies   Review of Systems  Constitutional:  Positive for fatigue.  HENT: Negative.    Respiratory:  Positive for cough.   Cardiovascular: Negative.   Gastrointestinal: Negative.   Musculoskeletal: Negative.   Skin: Negative.   Neurological: Negative.   Psychiatric/Behavioral: Negative.       Today's Vitals   07/15/22 0951  BP:  124/80  Pulse: 100  Temp: 98.5 F (36.9 C)  SpO2: 98%  Weight: 176 lb (79.8 kg)  Height: 5' 0.6" (1.539 m)  PainSc: 0-No pain   Body mass index is 33.7 kg/m.  Wt Readings from Last 3 Encounters:  07/15/22 176 lb (79.8 kg)  07/09/22 176 lb (79.8 kg)  03/29/22 173 lb 9.6 oz (78.7 kg)     Objective:  Physical Exam Vitals and nursing note reviewed.  Constitutional:      Appearance: Normal appearance.  HENT:     Head: Normocephalic and atraumatic.     Right Ear: Tympanic membrane, ear canal and external ear normal. There is no impacted cerumen.     Left Ear: Tympanic membrane, ear canal and external ear normal. There is no impacted cerumen.     Nose:     Comments: Masked     Mouth/Throat:     Comments: Masked  Eyes:     Extraocular Movements:  Extraocular movements intact.  Cardiovascular:     Rate and Rhythm: Normal rate and regular rhythm.     Heart sounds: Normal heart sounds.  Pulmonary:     Effort: Pulmonary effort is normal.     Breath sounds: No wheezing or rhonchi.     Comments: Decreased breath sounds R base Musculoskeletal:     Cervical back: Normal range of motion.  Skin:    General: Skin is warm.  Neurological:     General: No focal deficit present.     Mental Status: She is alert.  Psychiatric:        Mood and Affect: Mood normal.        Behavior: Behavior normal.      Assessment And Plan:     1. Viral URI with cough Comments: Due to persistent sx, I will refer her for CXR. She was given Kenalog, 40mg  IM x 1.  I will send rx hydromet syrup and albuterol inhaler to use prn. - triamcinolone acetonide (KENALOG-40) injection 40 mg - DG Chest 2 View; Future   Patient was given opportunity to ask questions. Patient verbalized understanding of the plan and was able to repeat key elements of the plan. All questions were answered to their satisfaction.   I, , MD, have reviewed all documentation for this visit. The documentation on 07/15/22 for the exam, diagnosis, procedures, and orders are all accurate and complete.   IF YOU HAVE BEEN REFERRED TO A SPECIALIST, IT MAY TAKE 1-2 WEEKS TO SCHEDULE/PROCESS THE REFERRAL. IF YOU HAVE NOT HEARD FROM US/SPECIALIST IN TWO WEEKS, PLEASE GIVE 09/13/22 A CALL AT [phone].   THE PATIENT IS ENCOURAGED TO PRACTICE SOCIAL DISTANCING DUE TO THE COVID-19 PANDEMIC.

## 2022-07-16 ENCOUNTER — Encounter: Payer: Self-pay | Admitting: Internal Medicine

## 2022-07-16 ENCOUNTER — Other Ambulatory Visit: Payer: Self-pay | Admitting: Internal Medicine

## 2022-08-02 ENCOUNTER — Ambulatory Visit: Payer: Medicare Other | Admitting: Dietician

## 2022-08-06 ENCOUNTER — Other Ambulatory Visit: Payer: Self-pay | Admitting: Internal Medicine

## 2022-08-07 ENCOUNTER — Ambulatory Visit (INDEPENDENT_AMBULATORY_CARE_PROVIDER_SITE_OTHER): Payer: Medicare Other

## 2022-08-07 ENCOUNTER — Ambulatory Visit (INDEPENDENT_AMBULATORY_CARE_PROVIDER_SITE_OTHER): Payer: Medicare Other | Admitting: Internal Medicine

## 2022-08-07 ENCOUNTER — Encounter: Payer: Self-pay | Admitting: Internal Medicine

## 2022-08-07 VITALS — BP 130/80 | HR 96 | Temp 98.2°F | Ht 64.0 in | Wt 174.2 lb

## 2022-08-07 VITALS — BP 130/80 | HR 96 | Temp 98.2°F | Ht 64.0 in | Wt 174.0 lb

## 2022-08-07 DIAGNOSIS — Z6829 Body mass index (BMI) 29.0-29.9, adult: Secondary | ICD-10-CM

## 2022-08-07 DIAGNOSIS — E1159 Type 2 diabetes mellitus with other circulatory complications: Secondary | ICD-10-CM | POA: Diagnosis not present

## 2022-08-07 DIAGNOSIS — E669 Obesity, unspecified: Secondary | ICD-10-CM

## 2022-08-07 DIAGNOSIS — E1169 Type 2 diabetes mellitus with other specified complication: Secondary | ICD-10-CM

## 2022-08-07 DIAGNOSIS — R011 Cardiac murmur, unspecified: Secondary | ICD-10-CM | POA: Diagnosis not present

## 2022-08-07 DIAGNOSIS — Z0000 Encounter for general adult medical examination without abnormal findings: Secondary | ICD-10-CM

## 2022-08-07 DIAGNOSIS — I152 Hypertension secondary to endocrine disorders: Secondary | ICD-10-CM | POA: Diagnosis not present

## 2022-08-07 MED ORDER — MOUNJARO 5 MG/0.5ML ~~LOC~~ SOAJ: 5.0000 mg | SUBCUTANEOUS | 0 refills | Status: DC

## 2022-08-07 NOTE — Progress Notes (Signed)
Subjective:   Sharon Mcdonald is a 67 y.o. female who presents for Medicare Annual (Subsequent) preventive examination.  Review of Systems     Cardiac Risk Factors include: advanced age (>64men, >11 women);diabetes mellitus;hypertension     Objective:    Today's Vitals   08/07/22 1430 08/07/22 1450  BP: 138/70 130/80  Pulse: 96   Temp: 98.2 F (36.8 C)   TempSrc: Oral   SpO2: 96%   Weight: 174 lb 3.2 oz (79 kg)   Height: 5\' 4"  (1.626 m)    Body mass index is 29.9 kg/m.     08/07/2022    2:40 PM 07/26/2021    4:00 PM 07/27/2020    2:21 PM 06/26/2019    8:58 PM  Advanced Directives  Does Patient Have a Medical Advance Directive? Yes Yes Yes No  Type of 06/28/2019 of [place];Living will Healthcare Power of [place];Living will Healthcare Power of Attorney   Copy of Healthcare Power of Attorney in Chart? No - copy requested No - copy requested No - copy requested   Would patient like information on creating a medical advance directive?    No - Guardian declined    Current Medications (verified) Outpatient Encounter Medications as of 08/07/2022  Medication Sig   ACCU-CHEK GUIDE test strip USE TO CHECK BLOOD SUGARS 2 TIMES DAILY. DX CODE:E11.65   Accu-Chek Softclix Lancets lancets USE TO CHECK BLOOD SUGARS 2 TIMES DAILY. DX CODE:E11.65   albuterol (VENTOLIN HFA) 108 (90 Base) MCG/ACT inhaler TAKE 2 PUFFS BY MOUTH EVERY 6 HOURS AS NEEDED FOR WHEEZE OR SHORTNESS OF BREATH   atorvastatin (LIPITOR) 20 MG tablet Take 1 tablet (20 mg total) by mouth daily.   BD PEN NEEDLE NANO 2ND GEN 32G X 4 MM MISC USE DAILY IN THE AFTERNOON   Blood Glucose Monitoring Suppl (ACCU-CHEK GUIDE ME) w/Device KIT Use to check blood sugar 3 times a day. Dx code e11.65   dorzolamide-timolol (COSOPT) 22.3-6.8 MG/ML ophthalmic solution 1 drop 2 (two) times daily.   gabapentin (NEURONTIN) 300 MG capsule TAKE 1 CAPSULE BY MOUTH EVERY DAY   glipiZIDE (GLUCOTROL) 5 MG tablet Take 1  tablet (5 mg total) by mouth 2 (two) times daily before a meal.   insulin degludec (TRESIBA FLEXTOUCH) 100 UNIT/ML FlexTouch Pen Inject 30 Units into the skin daily.   lisinopril (ZESTRIL) 40 MG tablet TAKE 1 TABLET BY MOUTH EVERY DAY   amoxicillin-clavulanate (AUGMENTIN) 875-125 MG tablet Take 1 tablet by mouth 2 (two) times daily. (Patient not taking: Reported on 08/07/2022)   HYDROcodone bit-homatropine (HYDROMET) 5-1.5 MG/5ML syrup Take 5 mLs by mouth every 6 (six) hours as needed. (Patient not taking: Reported on 08/07/2022)   meloxicam (MOBIC) 15 MG tablet Take 15 mg by mouth daily. (Patient not taking: Reported on 08/07/2022)   tirzepatide [facility]) 2.5 MG/0.5ML Pen Inject 2.5 mg into the skin once a week. (Patient not taking: Reported on 08/07/2022)   No facility-administered encounter medications on file as of 08/07/2022.    Allergies (verified) Patient has no known allergies.   History: Past Medical History:  Diagnosis Date   Diabetes mellitus    Diabetic retinopathy (HCC)    High cholesterol    Hypertension    Past Surgical History:  Procedure Laterality Date   BREAST CYST EXCISION Left    BREAST SURGERY     REDUCTION MAMMAPLASTY Bilateral    TONSILLECTOMY     TUBAL LIGATION     Family History  Problem Relation  Age of Onset   Diabetes Mother    Hypertension Mother    Obesity Mother    Diabetes Brother    Social History   Socioeconomic History   Marital status: Married    Spouse name: Not on file   Number of children: Not on file   Years of education: Not on file   Highest education level: Not on file  Occupational History   Not on file  Tobacco Use   Smoking status: Former    Types: Cigarettes    Quit date: 09/07/1978    Years since quitting: 43.9   Smokeless tobacco: Never   Tobacco comments:    1/2 pack per week  Vaping Use   Vaping Use: Never used  Substance and Sexual Activity   Alcohol use: No   Drug use: No   Sexual activity: Yes    Birth  control/protection: Surgical  Other Topics Concern   Not on file  Social History Narrative   Not on file   Social Determinants of Health   Financial Resource Strain: Low Risk  (08/07/2022)   Overall Financial Resource Strain (CARDIA)    Difficulty of Paying Living Expenses: Not hard at all  Food Insecurity: No Food Insecurity (08/07/2022)   Hunger Vital Sign    Worried About Running Out of Food in the Last Year: Never true    Ran Out of Food in the Last Year: Never true  Transportation Needs: No Transportation Needs (08/07/2022)   PRAPARE - Administrator, Civil Service (Medical): No    Lack of Transportation (Non-Medical): No  Physical Activity: Inactive (08/07/2022)   Exercise Vital Sign    Days of Exercise per Week: 0 days    Minutes of Exercise per Session: 0 min  Stress: No Stress Concern Present (08/07/2022)   [employer] of Occupational Health - Occupational Stress Questionnaire    Feeling of Stress : Not at all  Social Connections: Socially Integrated (07/27/2020)   Social Connection and Isolation Panel [NHANES]    Frequency of Communication with Friends and Family: More than three times a week    Frequency of Social Gatherings with Friends and Family: Three times a week    Attends Religious Services: More than 4 times per year    Active Member of Clubs or Organizations: Yes    Attends Engineer, structural: More than 4 times per year    Marital Status: Married    Tobacco Counseling Counseling given: Not Answered Tobacco comments: 1/2 pack per week   Clinical Intake:  Pre-visit preparation completed: Yes  Pain : No/denies pain     Nutritional Status: BMI 25 -29 Overweight Nutritional Risks: None Diabetes: Yes  How often do you need to have someone help you when you read instructions, pamphlets, or other written materials from your doctor or pharmacy?: 1 - Never  Diabetic? Yes Nutrition Risk Assessment:  Has the patient had any N/V/D  within the last 2 months?  No  Does the patient have any non-healing wounds?  No  Has the patient had any unintentional weight loss or weight gain?  No   Diabetes:  Is the patient diabetic?  Yes  If diabetic, was a CBG obtained today?  No  Did the patient bring in their glucometer from home?  No  How often do you monitor your CBG's? Does not.   Financial Strains and Diabetes Management:  Are you having any financial strains with the device, your supplies or your medication?  No .  Does the patient want to be seen by Chronic Care Management for management of their diabetes?  No  Would the patient like to be referred to a Nutritionist or for Diabetic Management?  No   Diabetic Exams:  Diabetic Eye Exam: Overdue for diabetic eye exam. Pt has been advised about the importance in completing this exam. Patient advised to call and schedule an eye exam. Diabetic Foot Exam: Completed 03/29/2022   Interpreter Needed?: No  Information entered by :: NAllen LPN   Activities of Daily Living    08/07/2022    2:40 PM  In your present state of health, do you have any difficulty performing the following activities:  Hearing? 0  Vision? 0  Difficulty concentrating or making decisions? 0  Walking or climbing stairs? 0  Dressing or bathing? 0  Doing errands, shopping? 0  Preparing Food and eating ? N  Using the Toilet? N  In the past six months, have you accidently leaked urine? N  Do you have problems with loss of bowel control? N  Managing your Medications? N  Managing your Finances? N  Housekeeping or managing your Housekeeping? N    Patient Care Team: Glendale Chard, MD as PCP - General (Internal Medicine)  Indicate any recent Medical Services you may have received from other than [facility] providers in the past year (date may be approximate).     Assessment:   This is a routine wellness examination for Artie.  Hearing/Vision screen Vision Screening - Comments:: Regular eye exams,  [facility]  Dietary issues and exercise activities discussed: Current Exercise Habits: The patient does not participate in regular exercise at present   Goals Addressed             This Visit's Progress    Patient Stated       08/07/2022, wants to lose 30 pounds       Depression Screen    08/07/2022    2:40 PM 07/26/2021    4:01 PM 07/27/2020    2:22 PM 01/03/2020    4:45 PM 04/01/2019   11:32 AM 10/27/2018    1:59 PM 06/24/2018    2:19 PM  PHQ 2/9 Scores  PHQ - 2 Score 0 0 0 0 0 0 0    Fall Risk    08/07/2022    2:40 PM 07/26/2021    4:01 PM 07/27/2020    2:22 PM 01/03/2020    4:45 PM 06/30/2019    2:04 PM  [place] in the past year? 0 0 0 0 1  Number falls in past yr: 0   0 0  Injury with Fall? 0   0 1  Risk for fall due to : Medication side effect Medication side effect     Follow up Falls prevention discussed;Education provided;Falls evaluation completed Falls evaluation completed;Education provided;Falls prevention discussed       FALL RISK PREVENTION PERTAINING TO THE HOME:  Any stairs in or around the home? Yes  If so, are there any without handrails? No  Home free of loose throw rugs in walkways, pet beds, electrical cords, etc? Yes  Adequate lighting in your home to reduce risk of falls? Yes   ASSISTIVE DEVICES UTILIZED TO PREVENT FALLS:  Life alert? No  Use of a cane, walker or w/c? No  Grab bars in the bathroom? No  Shower chair or bench in shower? No  Elevated toilet seat or a handicapped toilet? No  TIMED UP AND GO:  Was the test performed? Yes .  Length of time to ambulate 10 feet: 5 sec.   Gait steady and fast without use of assistive device  Cognitive Function:        08/07/2022    2:41 PM 07/26/2021    4:02 PM 07/27/2020    2:24 PM  6CIT Screen  What Year? 0 points 0 points 0 points  What month? 0 points 0 points 0 points  What time? 0 points 0 points 0 points  Count back from 20 0 points 0 points 0 points   Months in reverse 2 points 0 points 0 points  Repeat phrase 0 points 0 points 0 points  Total Score 2 points 0 points 0 points    Immunizations Immunization History  Administered Date(s) Administered   DTaP 06/03/2017   PFIZER(Purple Top)SARS-COV-2 Vaccination 09/17/2019, 10/12/2019, 04/24/2020   Pfizer Covid-19 Vaccine Bivalent Booster 108yrs & up 07/03/2021   Unspecified SARS-COV-2 Vaccination 04/01/2022    TDAP status: Up to date  Flu Vaccine status: Declined, Education has been provided regarding the importance of this vaccine but patient still declined. Advised may receive this vaccine at local pharmacy or Health Dept. Aware to provide a copy of the vaccination record if obtained from local pharmacy or Health Dept. Verbalized acceptance and understanding.  Pneumococcal vaccine status: Declined,  Education has been provided regarding the importance of this vaccine but patient still declined. Advised may receive this vaccine at local pharmacy or Health Dept. Aware to provide a copy of the vaccination record if obtained from local pharmacy or Health Dept. Verbalized acceptance and understanding.   Covid-19 vaccine status: Completed vaccines  Qualifies for Shingles Vaccine? Yes   Zostavax completed No   Shingrix Completed?: No.    Education has been provided regarding the importance of this vaccine. Patient has been advised to call insurance company to determine out of pocket expense if they have not yet received this vaccine. Advised may also receive vaccine at local pharmacy or Health Dept. Verbalized acceptance and understanding.  Screening Tests Health Maintenance  Topic Date Due   OPHTHALMOLOGY EXAM  09/29/2021   COVID-19 Vaccine (6 - 2023-24 season) 05/27/2022   Medicare Annual Wellness (AWV)  07/26/2022   Diabetic kidney evaluation - Urine ACR  08/15/2022   INFLUENZA VACCINE  10/06/2022 (Originally 02/05/2022)   Zoster Vaccines- Shingrix (1 of 2) 10/08/2022 (Originally  07/19/1974)   Pneumonia Vaccine 69+ Years old (1 - PCV) 08/08/2023 (Originally 07/19/2020)   HEMOGLOBIN A1C  09/27/2022   Diabetic kidney evaluation - eGFR measurement  03/30/2023   FOOT EXAM  03/30/2023   MAMMOGRAM  07/18/2023   COLONOSCOPY (Pts 45-27yrs Insurance coverage will need to be confirmed)  01/10/2025   DTaP/Tdap/Td (2 - Tdap) 06/04/2027   DEXA SCAN  Completed   Hepatitis C Screening  Completed   HPV VACCINES  Aged Out    Health Maintenance  Health Maintenance Due  Topic Date Due   OPHTHALMOLOGY EXAM  09/29/2021   COVID-19 Vaccine (6 - 2023-24 season) 05/27/2022   Medicare Annual Wellness (AWV)  07/26/2022   Diabetic kidney evaluation - Urine ACR  08/15/2022    Colorectal cancer screening: Type of screening: Colonoscopy. Completed 01/11/2015. Repeat every 10 years  Mammogram status: Completed 07/17/2021. Repeat every year  Bone Density status: Completed 07/13/2021.   Lung Cancer Screening: (Low Dose CT Chest recommended if Age 53-80 years, 30 pack-year currently smoking OR have quit w/in 15years.) does not qualify.  Lung Cancer Screening Referral: no  Additional Screening:  Hepatitis C Screening: does qualify; Completed 06/24/2018  Vision Screening: Recommended annual ophthalmology exams for early detection of glaucoma and other disorders of the eye. Is the patient up to date with their annual eye exam?  No  Who is the provider or what is the name of the office in which the patient attends annual eye exams? [facility]  If pt is not established with a provider, would they like to be referred to a provider to establish care? No .   Dental Screening: Recommended annual dental exams for proper oral hygiene  Community Resource Referral / Chronic Care Management: CRR required this visit?  No   CCM required this visit?  No      Plan:     I have personally reviewed and noted the following in the patient's chart:   Medical and social history Use of alcohol, tobacco or  illicit drugs  Current medications and supplements including opioid prescriptions. Patient is not currently taking opioid prescriptions. Functional ability and status Nutritional status Physical activity Advanced directives List of other physicians Hospitalizations, surgeries, and ER visits in previous 12 months Vitals Screenings to include cognitive, depression, and falls Referrals and appointments  In addition, I have reviewed and discussed with patient certain preventive protocols, quality metrics, and best practice recommendations. A written personalized care plan for preventive services as well as general preventive health recommendations were provided to patient.     Kellie Simmering, LPN   [id number]   Nurse Notes: none

## 2022-08-07 NOTE — Progress Notes (Signed)
I,Sharon Mcdonald,acting as a scribe for Sharon Greenland, MD.,have documented all relevant documentation on the behalf of Sharon Greenland, MD,as directed by  Sharon Greenland, MD while in the presence of Sharon Greenland, MD.    Subjective:     Patient ID: Sharon Mcdonald , female    DOB: [birth year] , 67 y.o.   MRN: [id number]   Chief Complaint  Patient presents with   Diabetes   Hypertension    HPI  She presents today for DM and HTN f/u. She is now taking her meds as prescribed. She adds that she has been walking daily as well. She denies headaches, chest pain and shortness of breath. She has no specific questions or concerns.   Diabetes She presents for her follow-up diabetic visit. She has type 2 diabetes mellitus. Her disease course has been stable. There are no hypoglycemic associated symptoms. Pertinent negatives for diabetes include no blurred vision and no chest pain. There are no hypoglycemic complications. Diabetic complications include nephropathy. Risk factors for coronary artery disease include diabetes mellitus, dyslipidemia, hypertension and post-menopausal. She is following a diabetic diet. She participates in exercise three times a week. Her breakfast blood glucose is taken between 7-8 am. Her breakfast blood glucose range is generally 90-110 mg/dl. An ACE inhibitor/angiotensin II receptor blocker is being taken. Eye exam is current.  Hypertension This is a chronic problem. The current episode started more than 1 year ago. The problem is unchanged. The problem is controlled. Pertinent negatives include no blurred vision, chest pain, palpitations or shortness of breath.     Past Medical History:  Diagnosis Date   Diabetes mellitus    Diabetic retinopathy ([place])    High cholesterol    Hypertension      Family History  Problem Relation Age of Onset   Diabetes Mother    Hypertension Mother    Obesity Mother    Diabetes Brother      Current Outpatient  Medications:    tirzepatide (MOUNJARO) 5 MG/0.5ML Pen, Inject 5 mg into the skin once a week., Disp: 2 mL, Rfl: 0   ACCU-CHEK GUIDE test strip, USE TO CHECK BLOOD SUGARS 2 TIMES DAILY. DX CODE:E11.65, Disp: 100 strip, Rfl: 11   Accu-Chek Softclix Lancets lancets, USE TO CHECK BLOOD SUGARS 2 TIMES DAILY. DX CODE:E11.65, Disp: 100 each, Rfl: 11   albuterol (VENTOLIN HFA) 108 (90 Base) MCG/ACT inhaler, TAKE 2 PUFFS BY MOUTH EVERY 6 HOURS AS NEEDED FOR WHEEZE OR SHORTNESS OF BREATH, Disp: 8.5 each, Rfl: 1   atorvastatin (LIPITOR) 20 MG tablet, Take 1 tablet (20 mg total) by mouth daily., Disp: 90 tablet, Rfl: 3   BD PEN NEEDLE NANO 2ND GEN 32G X 4 MM MISC, USE DAILY IN THE AFTERNOON, Disp: 100 each, Rfl: 3   Blood Glucose Monitoring Suppl (ACCU-CHEK GUIDE ME) w/Device KIT, Use to check blood sugar 3 times a day. Dx code e11.65, Disp: 1 kit, Rfl: 1   dorzolamide-timolol (COSOPT) 22.3-6.8 MG/ML ophthalmic solution, 1 drop 2 (two) times daily., Disp: , Rfl:    gabapentin (NEURONTIN) 300 MG capsule, TAKE 1 CAPSULE BY MOUTH EVERY DAY, Disp: 90 capsule, Rfl: 1   glipiZIDE (GLUCOTROL) 5 MG tablet, Take 1 tablet (5 mg total) by mouth 2 (two) times daily before a meal., Disp: 180 tablet, Rfl: 1   HYDROcodone bit-homatropine (HYDROMET) 5-1.5 MG/5ML syrup, Take 5 mLs by mouth every 6 (six) hours as needed. (Patient not taking: Reported on 08/07/2022), Disp: 120 mL,  Rfl: 0   insulin degludec (TRESIBA FLEXTOUCH) 100 UNIT/ML FlexTouch Pen, Inject 30 Units into the skin daily., Disp: 30 mL, Rfl: 3   lisinopril (ZESTRIL) 40 MG tablet, TAKE 1 TABLET BY MOUTH EVERY DAY, Disp: 90 tablet, Rfl: 3   meloxicam (MOBIC) 15 MG tablet, Take 15 mg by mouth daily. (Patient not taking: Reported on 08/07/2022), Disp: , Rfl:    No Known Allergies   Review of Systems  Constitutional: Negative.   Eyes:  Negative for blurred vision.  Respiratory: Negative.  Negative for shortness of breath.   Cardiovascular: Negative.  Negative for  chest pain and palpitations.  Neurological: Negative.   Psychiatric/Behavioral: Negative.    All other systems reviewed and are negative.    Today's Vitals   08/07/22 1524  BP: 130/80  Pulse: 96  Temp: 98.2 F (36.8 C)  Weight: 174 lb (78.9 kg)  Height: 5\' 4"  (1.626 m)   Body mass index is 29.87 kg/m.  Wt Readings from Last 3 Encounters:  08/07/22 174 lb (78.9 kg)  08/07/22 174 lb 3.2 oz (79 kg)  07/15/22 176 lb (79.8 kg)    Objective:  Physical Exam Vitals and nursing note reviewed.  Constitutional:      Appearance: Normal appearance.  HENT:     Head: Normocephalic and atraumatic.     Nose:     Comments: Masked     Mouth/Throat:     Comments: Masked  Eyes:     Extraocular Movements: Extraocular movements intact.  Cardiovascular:     Rate and Rhythm: Normal rate and regular rhythm.     Heart sounds: Murmur heard.  Pulmonary:     Effort: Pulmonary effort is normal.     Breath sounds: Normal breath sounds.  Musculoskeletal:     Cervical back: Normal range of motion.  Skin:    General: Skin is warm.  Neurological:     General: No focal deficit present.     Mental Status: She is alert.  Psychiatric:        Mood and Affect: Mood normal.        Behavior: Behavior normal.      Assessment And Plan:     1. Obesity, diabetes, and hypertension syndrome ([place]) Comments: Chronic, BP is fairly controlled. Goal is LESS THAN 130/80. She is encouraged to follow low sodium diet. She is now followed by Endo.  She will c/w glipizide and Tresiba. My goal is to first wean her off of glipizide.  She states she was taken off of Ozempic and does not know why. We discussed the use of Mounjaro to address continued hyperglycemia. She confirms no Fhx thyroid cancer. She was taught how to self administer the medication. I will send 5mg  dose to the pharmacy to start PA process. She was advised of possible side effects including GI distress, constipation and diarrhea. All questions were  answered to her satisfaction. She is reminded to stop eating when full.  It is also important to include strength training into her exercise routine and to have adequate protein intake to decrease risk of muscle wasting. She will f/u in 4-6 weeks for re-evaluation.  - CMP14+EGFR - Hemoglobin A1c  2. Heart murmur Comments: It appears she never had echo ordered last year .I wil re-order again. - ECHOCARDIOGRAM COMPLETE; Future  3. Body mass index (BMI) 29.0-29.9, adult Comments: She is encouraged to aim for at least 150 minutes of exercise/week.   Patient was given opportunity to ask questions. Patient verbalized understanding  of the plan and was able to repeat key elements of the plan. All questions were answered to their satisfaction.   I, Sharon Greenland, MD, have reviewed all documentation for this visit. The documentation on 08/07/22 for the exam, diagnosis, procedures, and orders are all accurate and complete.   IF YOU HAVE BEEN REFERRED TO A SPECIALIST, IT MAY TAKE 1-2 WEEKS TO SCHEDULE/PROCESS THE REFERRAL. IF YOU HAVE NOT HEARD FROM US/SPECIALIST IN TWO WEEKS, PLEASE GIVE [place] A CALL AT [phone] X [phone].   THE PATIENT IS ENCOURAGED TO PRACTICE SOCIAL DISTANCING DUE TO THE COVID-19 PANDEMIC.

## 2022-08-07 NOTE — Patient Instructions (Signed)
Hypertension, Adult Hypertension is another name for high blood pressure. High blood pressure forces your heart to work harder to pump blood. This can cause problems over time. There are two numbers in a blood pressure reading. There is a top number (systolic) over a bottom number (diastolic). It is best to have a blood pressure that is below 120/80. What are the causes? The cause of this condition is not known. Some other conditions can lead to high blood pressure. What increases the risk? Some lifestyle factors can make you more likely to develop high blood pressure: Smoking. Not getting enough exercise or physical activity. Being overweight. Having too much fat, sugar, calories, or salt (sodium) in your diet. Drinking too much alcohol. Other risk factors include: Having any of these conditions: Heart disease. Diabetes. High cholesterol. Kidney disease. Obstructive sleep apnea. Having a family history of high blood pressure and high cholesterol. Age. The risk increases with age. Stress. What are the signs or symptoms? High blood pressure may not cause symptoms. Very high blood pressure (hypertensive crisis) may cause: Headache. Fast or uneven heartbeats (palpitations). Shortness of breath. Nosebleed. Vomiting or feeling like you may vomit (nauseous). Changes in how you see. Very bad chest pain. Feeling dizzy. Seizures. How is this treated? This condition is treated by making healthy lifestyle changes, such as: Eating healthy foods. Exercising more. Drinking less alcohol. Your doctor may prescribe medicine if lifestyle changes do not help enough and if: Your top number is above 130. Your bottom number is above 80. Your personal target blood pressure may vary. Follow these instructions at home: Eating and drinking  If told, follow the DASH eating plan. To follow this plan: Fill one half of your plate at each meal with fruits and vegetables. Fill one fourth of your plate  at each meal with whole grains. Whole grains include whole-wheat pasta, brown rice, and whole-grain bread. Eat or drink low-fat dairy products, such as skim milk or low-fat yogurt. Fill one fourth of your plate at each meal with low-fat (lean) proteins. Low-fat proteins include fish, chicken without skin, eggs, beans, and tofu. Avoid fatty meat, cured and processed meat, or chicken with skin. Avoid pre-made or processed food. Limit the amount of salt in your diet to less than 1,500 mg each day. Do not drink alcohol if: Your doctor tells you not to drink. You are pregnant, may be pregnant, or are planning to become pregnant. If you drink alcohol: Limit how much you have to: 0-1 drink a day for women. 0-2 drinks a day for men. Know how much alcohol is in your drink. In the [place] one drink equals one 12 oz bottle of beer (355 mL), one 5 oz glass of wine (148 mL), or one 1 oz glass of hard liquor (44 mL). Lifestyle  Work with your doctor to stay at a healthy weight or to lose weight. Ask your doctor what the best weight is for you. Get at least 30 minutes of exercise that causes your heart to beat faster (aerobic exercise) most days of the week. This may include walking, swimming, or biking. Get at least 30 minutes of exercise that strengthens your muscles (resistance exercise) at least 3 days a week. This may include lifting weights or doing Pilates. Do not smoke or use any products that contain nicotine or tobacco. If you need help quitting, ask your doctor. Check your blood pressure at home as told by your doctor. Keep all follow-up visits. Medicines Take over-the-counter and prescription medicines  only as told by your doctor. Follow directions carefully. ?Do not skip doses of blood pressure medicine. The medicine does not work as well if you skip doses. Skipping doses also puts you at risk for problems. ?Ask your doctor about side effects or reactions to medicines that you should watch  for. ?Contact a doctor if: ?You think you are having a reaction to the medicine you are taking. ?You have headaches that keep coming back. ?You feel dizzy. ?You have swelling in your ankles. ?You have trouble with your vision. ?Get help right away if: ?You get a very bad headache. ?You start to feel mixed up (confused). ?You feel weak or numb. ?You feel faint. ?You have very bad pain in your: ?Chest. ?Belly (abdomen). ?You vomit more than once. ?You have trouble breathing. ?These symptoms may be an emergency. Get help right away. Call 911. ?Do not wait to see if the symptoms will go away. ?Do not drive yourself to the hospital. ?Summary ?Hypertension is another name for high blood pressure. ?High blood pressure forces your heart to work harder to pump blood. ?For most people, a normal blood pressure is less than 120/80. ?Making healthy choices can help lower blood pressure. If your blood pressure does not get lower with healthy choices, you may need to take medicine. ?This information is not intended to replace advice given to you by your health care provider. Make sure you discuss any questions you have with your health care provider. ?Document Revised: 04/12/2021 Document Reviewed: 04/12/2021 ?Elsevier Patient Education ? 2023 Elsevier Inc. ? ?

## 2022-08-07 NOTE — Patient Instructions (Signed)
Sharon Mcdonald , Thank you for taking time to come for your Medicare Wellness Visit. I appreciate your ongoing commitment to your health goals. Please review the following plan we discussed and let me know if I can assist you in the future.   These are the goals we discussed:  Goals      Patient Stated     "I would like to get off of these meds"     Patient Stated     07/26/2021, get off medications     Patient Stated     08/07/2022, wants to lose 30 pounds        This is a list of the screening recommended for you and due dates:  Health Maintenance  Topic Date Due   Eye exam for diabetics  09/29/2021   COVID-19 Vaccine (6 - 2023-24 season) 05/27/2022   Yearly kidney health urinalysis for diabetes  08/15/2022   Flu Shot  10/06/2022*   Zoster (Shingles) Vaccine (1 of 2) 10/08/2022*   Pneumonia Vaccine (1 - PCV) 08/08/2023*   Hemoglobin A1C  09/27/2022   Yearly kidney function blood test for diabetes  03/30/2023   Complete foot exam   03/30/2023   Mammogram  07/18/2023   Medicare Annual Wellness Visit  08/08/2023   Colon Cancer Screening  01/10/2025   DTaP/Tdap/Td vaccine (2 - Tdap) 06/04/2027   DEXA scan (bone density measurement)  Completed   Hepatitis C Screening: USPSTF Recommendation to screen - Ages 8-79 yo.  Completed   HPV Vaccine  Aged Out  *Topic was postponed. The date shown is not the original due date.    Advanced directives: Please bring a copy of your POA (Power of Attorney) and/or Living Will to your next appointment.   Conditions/risks identified: none  Next appointment: Follow up in one year for your annual wellness visit    Preventive Care 65 Years and Older, Female Preventive care refers to lifestyle choices and visits with your health care provider that can promote health and wellness. What does preventive care include? A yearly physical exam. This is also called an annual well check. Dental exams once or twice a year. Routine eye exams. Ask your  health care provider how often you should have your eyes checked. Personal lifestyle choices, including: Daily care of your teeth and gums. Regular physical activity. Eating a healthy diet. Avoiding tobacco and drug use. Limiting alcohol use. Practicing safe sex. Taking low-dose aspirin every day. Taking vitamin and mineral supplements as recommended by your health care provider. What happens during an annual well check? The services and screenings done by your health care provider during your annual well check will depend on your age, overall health, lifestyle risk factors, and family history of disease. Counseling  Your health care provider may ask you questions about your: Alcohol use. Tobacco use. Drug use. Emotional well-being. Home and relationship well-being. Sexual activity. Eating habits. History of falls. Memory and ability to understand (cognition). Work and work Statistician. Reproductive health. Screening  You may have the following tests or measurements: Height, weight, and BMI. Blood pressure. Lipid and cholesterol levels. These may be checked every 5 years, or more frequently if you are over 82 years old. Skin check. Lung cancer screening. You may have this screening every year starting at age 41 if you have a 30-pack-year history of smoking and currently smoke or have quit within the past 15 years. Fecal occult blood test (FOBT) of the stool. You may have this test every year  starting at age 64. Flexible sigmoidoscopy or colonoscopy. You may have a sigmoidoscopy every 5 years or a colonoscopy every 10 years starting at age 76. Hepatitis C blood test. Hepatitis B blood test. Sexually transmitted disease (STD) testing. Diabetes screening. This is done by checking your blood sugar (glucose) after you have not eaten for a while (fasting). You may have this done every 1-3 years. Bone density scan. This is done to screen for osteoporosis. You may have this done  starting at age 71. Mammogram. This may be done every 1-2 years. Talk to your health care provider about how often you should have regular mammograms. Talk with your health care provider about your test results, treatment options, and if necessary, the need for more tests. Vaccines  Your health care provider may recommend certain vaccines, such as: Influenza vaccine. This is recommended every year. Tetanus, diphtheria, and acellular pertussis (Tdap, Td) vaccine. You may need a Td booster every 10 years. Zoster vaccine. You may need this after age 67. Pneumococcal 13-valent conjugate (PCV13) vaccine. One dose is recommended after age 37. Pneumococcal polysaccharide (PPSV23) vaccine. One dose is recommended after age 51. Talk to your health care provider about which screenings and vaccines you need and how often you need them. This information is not intended to replace advice given to you by your health care provider. Make sure you discuss any questions you have with your health care provider. Document Released: 07/21/2015 Document Revised: 03/13/2016 Document Reviewed: 04/25/2015 Elsevier Interactive Patient Education  2017 [place] Prevention in the Home Falls can cause injuries. They can happen to people of all ages. There are many things you can do to make your home safe and to help prevent falls. What can I do on the outside of my home? Regularly fix the edges of walkways and driveways and fix any cracks. Remove anything that might make you trip as you walk through a door, such as a raised step or threshold. Trim any bushes or trees on the path to your home. Use bright outdoor lighting. Clear any walking paths of anything that might make someone trip, such as rocks or tools. Regularly check to see if handrails are loose or broken. Make sure that both sides of any steps have handrails. Any raised decks and porches should have guardrails on the edges. Have any leaves, snow, or  ice cleared regularly. Use sand or salt on walking paths during winter. Clean up any spills in your garage right away. This includes oil or grease spills. What can I do in the bathroom? Use night lights. Install grab bars by the toilet and in the tub and shower. Do not use towel bars as grab bars. Use non-skid mats or decals in the tub or shower. If you need to sit down in the shower, use a plastic, non-slip stool. Keep the floor dry. Clean up any water that spills on the floor as soon as it happens. Remove soap buildup in the tub or shower regularly. Attach bath mats securely with double-sided non-slip rug tape. Do not have throw rugs and other things on the floor that can make you trip. What can I do in the bedroom? Use night lights. Make sure that you have a light by your bed that is easy to reach. Do not use any sheets or blankets that are too big for your bed. They should not hang down onto the floor. Have a firm chair that has side arms. You can use this for  support while you get dressed. Do not have throw rugs and other things on the floor that can make you trip. What can I do in the kitchen? Clean up any spills right away. Avoid walking on wet floors. Keep items that you use a lot in easy-to-reach places. If you need to reach something above you, use a strong step stool that has a grab bar. Keep electrical cords out of the way. Do not use floor polish or wax that makes floors slippery. If you must use wax, use non-skid floor wax. Do not have throw rugs and other things on the floor that can make you trip. What can I do with my stairs? Do not leave any items on the stairs. Make sure that there are handrails on both sides of the stairs and use them. Fix handrails that are broken or loose. Make sure that handrails are as long as the stairways. Check any carpeting to make sure that it is firmly attached to the stairs. Fix any carpet that is loose or worn. Avoid having throw rugs at  the top or bottom of the stairs. If you do have throw rugs, attach them to the floor with carpet tape. Make sure that you have a light switch at the top of the stairs and the bottom of the stairs. If you do not have them, ask someone to add them for you. What else can I do to help prevent falls? Wear shoes that: Do not have high heels. Have rubber bottoms. Are comfortable and fit you well. Are closed at the toe. Do not wear sandals. If you use a stepladder: Make sure that it is fully opened. Do not climb a closed stepladder. Make sure that both sides of the stepladder are locked into place. Ask someone to hold it for you, if possible. Clearly mark and make sure that you can see: Any grab bars or handrails. First and last steps. Where the edge of each step is. Use tools that help you move around (mobility aids) if they are needed. These include: Canes. Walkers. Scooters. Crutches. Turn on the lights when you go into a dark area. Replace any light bulbs as soon as they burn out. Set up your furniture so you have a clear path. Avoid moving your furniture around. If any of your floors are uneven, fix them. If there are any pets around you, be aware of where they are. Review your medicines with your doctor. Some medicines can make you feel dizzy. This can increase your chance of falling. Ask your doctor what other things that you can do to help prevent falls. This information is not intended to replace advice given to you by your health care provider. Make sure you discuss any questions you have with your health care provider. Document Released: 04/20/2009 Document Revised: 11/30/2015 Document Reviewed: 07/29/2014 Elsevier Interactive Patient Education  2017 [employer].

## 2022-08-08 LAB — CMP14+EGFR
ALT: 11 IU/L (ref 0–32)
AST: 17 IU/L (ref 0–40)
Albumin/Globulin Ratio: 1.6 (ref 1.2–2.2)
Albumin: 4.2 g/dL (ref 3.9–4.9)
Alkaline Phosphatase: 73 IU/L (ref 44–121)
BUN/Creatinine Ratio: 16 (ref 12–28)
BUN: 11 mg/dL (ref 8–27)
Bilirubin Total: 0.3 mg/dL (ref 0.0–1.2)
CO2: 22 mmol/L (ref 20–29)
Calcium: 9.4 mg/dL (ref 8.7–10.3)
Chloride: 102 mmol/L (ref 96–106)
Creatinine, Ser: 0.67 mg/dL (ref 0.57–1.00)
Globulin, Total: 2.7 g/dL (ref 1.5–4.5)
Glucose: 166 mg/dL — ABNORMAL HIGH (ref 70–99)
Potassium: 4.2 mmol/L (ref 3.5–5.2)
Sodium: 140 mmol/L (ref 134–144)
Total Protein: 6.9 g/dL (ref 6.0–8.5)
eGFR: 96 mL/min/{1.73_m2} (ref >59)

## 2022-08-08 LAB — HEMOGLOBIN A1C
Est. average glucose Bld gHb Est-mCnc: 280 mg/dL
Hgb A1c MFr Bld: 11.4 % — ABNORMAL HIGH (ref 4.8–5.6)

## 2022-08-11 DIAGNOSIS — E663 Overweight: Secondary | ICD-10-CM | POA: Insufficient documentation

## 2022-08-11 DIAGNOSIS — Z6827 Body mass index (BMI) 27.0-27.9, adult: Secondary | ICD-10-CM | POA: Insufficient documentation

## 2022-08-11 DIAGNOSIS — R011 Cardiac murmur, unspecified: Secondary | ICD-10-CM | POA: Insufficient documentation

## 2022-08-11 DIAGNOSIS — E1159 Type 2 diabetes mellitus with other circulatory complications: Secondary | ICD-10-CM | POA: Insufficient documentation

## 2022-08-11 DIAGNOSIS — Z6829 Body mass index (BMI) 29.0-29.9, adult: Secondary | ICD-10-CM | POA: Insufficient documentation

## 2022-08-12 ENCOUNTER — Other Ambulatory Visit: Payer: Self-pay | Admitting: Internal Medicine

## 2022-08-16 ENCOUNTER — Encounter: Payer: Self-pay | Admitting: Internal Medicine

## 2022-08-16 ENCOUNTER — Ambulatory Visit (INDEPENDENT_AMBULATORY_CARE_PROVIDER_SITE_OTHER): Payer: Medicare Other | Admitting: Internal Medicine

## 2022-08-16 VITALS — BP 124/76 | Ht 64.0 in | Wt 173.0 lb

## 2022-08-16 DIAGNOSIS — Z794 Long term (current) use of insulin: Secondary | ICD-10-CM | POA: Diagnosis not present

## 2022-08-16 DIAGNOSIS — E1165 Type 2 diabetes mellitus with hyperglycemia: Secondary | ICD-10-CM

## 2022-08-16 DIAGNOSIS — E113593 Type 2 diabetes mellitus with proliferative diabetic retinopathy without macular edema, bilateral: Secondary | ICD-10-CM

## 2022-08-16 DIAGNOSIS — E785 Hyperlipidemia, unspecified: Secondary | ICD-10-CM

## 2022-08-16 MED ORDER — TIRZEPATIDE 5 MG/0.5ML ~~LOC~~ SOAJ: 5.0000 mg | SUBCUTANEOUS | 3 refills | Status: DC

## 2022-08-16 NOTE — Patient Instructions (Addendum)
-   Glipizide 5 mg 1 tablet before Breakfast  - Decrease Tresiba 32 units daily  - Increase  Mounjaro 5 mg once weekly     HOW TO TREAT LOW BLOOD SUGARS (Blood sugar LESS THAN 70 MG/DL) Please follow the RULE OF 15 for the treatment of hypoglycemia treatment (when your (blood sugars are less than 70 mg/dL)   STEP 1: Take 15 grams of carbohydrates when your blood sugar is low, which includes:  3-4 GLUCOSE TABS  OR 3-4 OZ OF JUICE OR REGULAR SODA OR ONE TUBE OF GLUCOSE GEL    STEP 2: RECHECK blood sugar in 15 MINUTES STEP 3: If your blood sugar is still low at the 15 minute recheck --> then, go back to STEP 1 and treat AGAIN with another 15 grams of carbohydrates.

## 2022-08-16 NOTE — Progress Notes (Signed)
Name: Sharon Mcdonald  MRN/ DOB: [id number], [birth year]   Age/ Sex: 67 y.o., female    PCP: Glendale Chard, MD   Reason for Endocrinology Evaluation: Type 2 Diabetes Mellitus     Date of Initial Endocrinology Visit: 03/09/2021    PATIENT IDENTIFIER: Sharon Mcdonald is a 67 y.o. female with a past medical history of T2DM, HTN, and dyslipidemia. The patient presented for initial endocrinology clinic visit on 03/09/2021 for consultative assistance with her diabetes management.      HPI: Sharon Mcdonald was    Diagnosed with DM in 2003 Prior Medications tried/Intolerance: She was on Ozempic, basal insulin, and Synjardy without intolerance      Hemoglobin A1c has ranged from 6.3% in 2021, peaking at 12.3% in 2022.   On her initial visit to our clinic she had an A1c of 10.0% . She was not on any glycemic agents for about a month before her presentation. We restarted Synjardy and started basal insulin    Intolerant to A[place]um due to diarrhea   Vania Rea was stopped September 2023 due to recurrent vaginitis, she was started on glipizide  Started Mounjaro 03/2022 SUBJECTIVE:   During the last visit (03/29/2022): 9.4%    Today (08/16/22): Sharon Mcdonald is here for a follow up on diabetes management.   She checks her blood sugars 1 daily . The patient has not had hypoglycemic episodes since the last clinic visit She normally 2 meals  ( lunch and supper) Denies nausea vomiting or diarrhea   She just started mounjaro last week due to cost  She just received the dexcom  and started using it recently   HOME DIABETES REGIMEN: Glipizide 5 mg BID- takes once daily  Tresiba 33 units daily  Mounjaro 2.5 mg weekly      Statin: yes ACE-I/ARB: yes Prior Diabetic Education: yes    CONTINUOUS GLUCOSE MONITORING RECORD INTERPRETATION    Dates of Recording: 1/27-08/16/2022  Sensor description: Dexcom  Results statistics:   CGM use % of time 57  Average and SD 184/50  Time in range      53   %  % Time Above 180 37  % Time above 250 10  % Time Below target 0   Glycemic patterns summary: BG's trend down during the night and increase during the day  Hyperglycemic episodes postprandial  Hypoglycemic episodes occurred N/A  Overnight periods: Trends down     DIABETIC COMPLICATIONS: Microvascular complications:   retinopathy (receives injection bilaterally ) Denies: CKD Last eye exam: Completed 02/2022 Dr. Katy Fitch , Dr Baird Cancer next Friday   Macrovascular complications:   Denies: CAD, PVD, CVA   PAST HISTORY: Past Medical History:  Past Medical History:  Diagnosis Date   Diabetes mellitus    Diabetic retinopathy ([place])    High cholesterol    Hypertension    Past Surgical History:  Past Surgical History:  Procedure Laterality Date   BREAST CYST EXCISION Left    BREAST SURGERY     REDUCTION MAMMAPLASTY Bilateral    TONSILLECTOMY     TUBAL LIGATION      Social History:  reports that she quit smoking about 43 years ago. Her smoking use included cigarettes. She has never used smokeless tobacco. She reports that she does not drink alcohol and does not use drugs. Family History:  Family History  Problem Relation Age of Onset   Diabetes Mother    Hypertension Mother    Obesity Mother    Diabetes Brother  HOME MEDICATIONS: Allergies as of 08/16/2022   No Known Allergies      Medication List        Accurate as of August 16, 2022 10:16 AM. If you have any questions, ask your nurse or doctor.          STOP taking these medications    Synjardy XR 12.11-998 MG Tb24 Generic drug: Empagliflozin-metFORMIN HCl ER Stopped by: Dorita Sciara, MD       TAKE these medications    Accu-Chek Guide Me w/Device Kit Use to check blood sugar 3 times a day. Dx code e11.65   Accu-Chek Guide test strip Generic drug: glucose blood USE TO CHECK BLOOD SUGARS 2 TIMES DAILY. DX CODE:E11.65   Accu-Chek Softclix Lancets lancets USE TO CHECK BLOOD  SUGARS 2 TIMES DAILY. DX CODE:E11.65   albuterol 108 (90 Base) MCG/ACT inhaler Commonly known as: VENTOLIN HFA TAKE 2 PUFFS BY MOUTH EVERY 6 HOURS AS NEEDED FOR WHEEZE OR SHORTNESS OF BREATH   atorvastatin 20 MG tablet Commonly known as: LIPITOR Take 1 tablet (20 mg total) by mouth daily.   BD Pen Needle Nano 2nd Gen 32G X 4 MM Misc Generic drug: Insulin Pen Needle USE DAILY IN THE AFTERNOON   dorzolamide-timolol 2-0.5 % ophthalmic solution Commonly known as: COSOPT 1 drop 2 (two) times daily.   gabapentin 300 MG capsule Commonly known as: NEURONTIN TAKE 1 CAPSULE BY MOUTH EVERY DAY   glipiZIDE 5 MG tablet Commonly known as: GLUCOTROL Take 1 tablet (5 mg total) by mouth 2 (two) times daily before a meal. What changed: when to take this   lisinopril 40 MG tablet Commonly known as: ZESTRIL TAKE 1 TABLET BY MOUTH EVERY DAY   meloxicam 15 MG tablet Commonly known as: MOBIC Take 15 mg by mouth daily.   Mounjaro 5 MG/0.5ML Pen Generic drug: tirzepatide Inject 5 mg into the skin once a week.   Tyler Aas FlexTouch 100 UNIT/ML FlexTouch Pen Generic drug: insulin degludec Inject 30 Units into the skin daily. What changed: how much to take         ALLERGIES: No Known Allergies   REVIEW OF SYSTEMS: A comprehensive ROS was conducted with the patient and is negative except as per HPI     OBJECTIVE:   VITAL SIGNS: BP 124/76 (BP Location: Left Arm, Patient Position: Sitting, Cuff Size: Large)   Ht 5' 4"$  (1.626 m)   Wt 173 lb (78.5 kg)   BMI 29.70 kg/m    PHYSICAL EXAM:  General: Pt appears well and is in NAD  Neck: General: Supple without adenopathy or carotid bruits. Thyroid: Thyroid size normal.  No goiter or nodules appreciated.   Lungs: Clear with good BS bilat with no rales, rhonchi, or wheezes  Heart: RRR   Extremities:  Lower extremities - No pretibial edema.   Neuro: MS is good with appropriate affect, pt is alert and Ox3    DM foot exam:  03/29/2022  The skin of the feet is intact without sores or ulcerations. The pedal pulses are 2+ on right and 2+ on left. The sensation is intact to a screening 5.07, 10 gram monofilament bilaterally   DATA REVIEWED:  Lab Results  Component Value Date   HGBA1C 11.4 (H) 08/07/2022   HGBA1C 9.4 (A) 03/29/2022   HGBA1C 10.3 (A) 11/16/2021    Latest Reference Range & Units 08/07/22 15:58  Sodium 134 - 144 mmol/L 140  Potassium 3.5 - 5.2 mmol/L 4.2  Chloride 96 - 106  mmol/L 102  CO2 20 - 29 mmol/L 22  Glucose 70 - 99 mg/dL 166 (H)  BUN 8 - 27 mg/dL 11  Creatinine 0.57 - 1.00 mg/dL 0.67  Calcium 8.7 - 10.3 mg/dL 9.4  BUN/Creatinine Ratio 12 - 28  16  eGFR >59 mL/min/1.73 96  Alkaline Phosphatase 44 - 121 IU/L 73  Albumin 3.9 - 4.9 g/dL 4.2  Albumin/Globulin Ratio 1.2 - 2.2  1.6  AST 0 - 40 IU/L 17  ALT 0 - 32 IU/L 11  Total Protein 6.0 - 8.5 g/dL 6.9  Total Bilirubin 0.0 - 1.2 mg/dL 0.3     ASSESSMENT / PLAN / RECOMMENDATIONS:   1) Type 2 Diabetes Mellitus, Poorly controlled, With retinopathic complications - Most recent A1c of 11.4 %. Goal A1c < 7.0 %.    -Patient continues with persistent hyperglycemia -She was not able to get the [facility] when it was prescribed back in September, but was able to get it last week, so far she has no side effects, she was advised to continue current dose for months, next prescription will be at a higher dose as below - INtolerant to Ozempic due to reported stool incontinence - Intolerant to Metformin due to diarrhea -Intolerant to Jardiance due to recurrent vaginal infections -She has not increased glipizide to twice daily as previously advised, in reviewing her CGM data her BG's are trending down and averaging 184 mg/DL, hence would not increase glipizide MEDICATIONS: -Continue glipizide 5 mg  daily -Change Tresiba 32 units daily -Increase Mounjaro 5 mg weekly    EDUCATION / INSTRUCTIONS: BG monitoring instructions: Patient is  instructed to check glucose once a day I reviewed the Rule of 15 for the treatment of hypoglycemia in detail with the patient. Literature supplied.   2) Diabetic complications:  Eye: Does  have known diabetic retinopathy.  Neuro/ Feet: Does not have known diabetic peripheral neuropathy. Renal: Patient does not have known baseline CKD. She is  on an ACEI/ARB at present.   3) Dyslipidemia:   -We switched  Pravastatin to atorvastatin 07/2021 -LDL has been being down, will continue to monitor   Medication   Continue  Atorvastatin 20 mg daily       F/U in 6 months    Signed electronically by: Mack Guise, MD  [facility] Endocrin[facility]  [facility] [address] [place], [place] [address] Phone: [phone] FAX: [phone]   CC: Glendale Chard, [address] [place] [address] Phone: [phone]  Fax: [phone]    Return to Endocrinology clinic as below: Future Appointments  Date Time Provider [place]  09/23/2022  4:00 PM Glendale Chard, MD [facility] None  02/17/2023  2:00 PM Glendale Chard, MD [facility] None  08/27/2023  3:00 PM [facility] None

## 2022-08-19 ENCOUNTER — Encounter: Payer: Self-pay | Admitting: Internal Medicine

## 2022-08-20 ENCOUNTER — Other Ambulatory Visit: Payer: Self-pay

## 2022-08-20 MED ORDER — DEXCOM G7 RECEIVER DEVI: 1 refills | Status: DC

## 2022-08-20 MED ORDER — DEXCOM G7 SENSOR MISC: 2 refills | Status: DC

## 2022-08-23 DIAGNOSIS — H43819 Vitreous degeneration, unspecified eye: Secondary | ICD-10-CM | POA: Diagnosis not present

## 2022-08-23 DIAGNOSIS — H3582 Retinal ischemia: Secondary | ICD-10-CM | POA: Diagnosis not present

## 2022-08-23 DIAGNOSIS — E113513 Type 2 diabetes mellitus with proliferative diabetic retinopathy with macular edema, bilateral: Secondary | ICD-10-CM | POA: Diagnosis not present

## 2022-08-27 ENCOUNTER — Other Ambulatory Visit: Payer: Self-pay | Admitting: Internal Medicine

## 2022-08-27 DIAGNOSIS — R011 Cardiac murmur, unspecified: Secondary | ICD-10-CM

## 2022-09-11 ENCOUNTER — Other Ambulatory Visit: Payer: Self-pay | Admitting: Internal Medicine

## 2022-09-11 DIAGNOSIS — Z1231 Encounter for screening mammogram for malignant neoplasm of breast: Secondary | ICD-10-CM

## 2022-09-23 ENCOUNTER — Encounter: Payer: Self-pay | Admitting: Internal Medicine

## 2022-09-23 ENCOUNTER — Ambulatory Visit (INDEPENDENT_AMBULATORY_CARE_PROVIDER_SITE_OTHER): Payer: Medicare Other | Admitting: Internal Medicine

## 2022-09-23 VITALS — BP 128/76 | HR 87 | Temp 98.2°F | Ht 64.0 in | Wt 171.4 lb

## 2022-09-23 DIAGNOSIS — R82998 Other abnormal findings in urine: Secondary | ICD-10-CM | POA: Diagnosis not present

## 2022-09-23 DIAGNOSIS — E663 Overweight: Secondary | ICD-10-CM | POA: Diagnosis not present

## 2022-09-23 DIAGNOSIS — Z794 Long term (current) use of insulin: Secondary | ICD-10-CM

## 2022-09-23 DIAGNOSIS — I10 Essential (primary) hypertension: Secondary | ICD-10-CM | POA: Diagnosis not present

## 2022-09-23 DIAGNOSIS — Z6829 Body mass index (BMI) 29.0-29.9, adult: Secondary | ICD-10-CM

## 2022-09-23 DIAGNOSIS — E113593 Type 2 diabetes mellitus with proliferative diabetic retinopathy without macular edema, bilateral: Secondary | ICD-10-CM | POA: Diagnosis not present

## 2022-09-23 DIAGNOSIS — I129 Hypertensive chronic kidney disease with stage 1 through stage 4 chronic kidney disease, or unspecified chronic kidney disease: Secondary | ICD-10-CM

## 2022-09-23 LAB — POCT URINALYSIS DIPSTICK
Bilirubin, UA: NEGATIVE
Glucose, UA: NEGATIVE
Ketones, UA: NEGATIVE
Nitrite, UA: POSITIVE
Protein, UA: NEGATIVE
Spec Grav, UA: >=1.03 — AB (ref 1.010–1.025)
Urobilinogen, UA: 0.2 E.U./dL
pH, UA: 5.5 (ref 5.0–8.0)

## 2022-09-23 MED ORDER — MOUNJARO 7.5 MG/0.5ML ~~LOC~~ SOAJ: 7.5000 mg | SUBCUTANEOUS | 0 refills | Status: DC

## 2022-09-23 NOTE — Progress Notes (Addendum)
Barnet Glasgow Martin,acting as a Education administrator for [name], MD.,have documented all relevant documentation on the behalf of [name], MD,as directed by  [name], MD while in the presence of [name], MD.    Subjective:     Patient ID: Sharon Mcdonald , female    DOB: [birth year] , 67 y.o.   MRN: [id number]   Chief Complaint  Patient presents with   Diabetes   Hypertension    HPI  She presents today for DM and HTN f/u. She is now taking her meds as prescribed. She denies headaches, chest pain and shortness of breath. She has no specific questions or concerns. She states she has noticed an improvement in her blood sugars.   BP Readings from Last 3 Encounters: 09/23/22 : 134/80 08/16/22 : 124/76 08/07/22 : 130/80    Diabetes She presents for her follow-up diabetic visit. She has type 2 diabetes mellitus. Her disease course has been stable. There are no hypoglycemic associated symptoms. Pertinent negatives for diabetes include no blurred vision and no chest pain. There are no hypoglycemic complications. Diabetic complications include nephropathy. Risk factors for coronary artery disease include diabetes mellitus, dyslipidemia, hypertension and post-menopausal. She is following a diabetic diet. She participates in exercise three times a week. Her breakfast blood glucose is taken between 7-8 am. Her breakfast blood glucose range is generally 90-110 mg/dl. An ACE inhibitor/angiotensin II receptor blocker is being taken. Eye exam is current.  Hypertension This is a chronic problem. The current episode started more than 1 year ago. The problem is unchanged. The problem is controlled. Pertinent negatives include no blurred vision, chest pain, palpitations or shortness of breath.     Past Medical History:  Diagnosis Date   Diabetes mellitus    Diabetic retinopathy ([place])    High cholesterol    Hypertension      Family History  Problem Relation Age of Onset   Diabetes  Mother    Hypertension Mother    Obesity Mother    Diabetes Brother      Current Outpatient Medications:    atorvastatin (LIPITOR) 20 MG tablet, Take 1 tablet (20 mg total) by mouth daily., Disp: 90 tablet, Rfl: 3   BD PEN NEEDLE NANO 2ND GEN 32G X 4 MM MISC, USE DAILY IN THE AFTERNOON, Disp: 100 each, Rfl: 3   Continuous Blood Gluc Receiver (DEXCOM G7 RECEIVER) DEVI, USE AS DIRECTED., Disp: 1 each, Rfl: 1   Continuous Blood Gluc Sensor (DEXCOM G7 SENSOR) MISC, USE AS DIRECTED., Disp: 1 each, Rfl: 2   dorzolamide-timolol (COSOPT) 22.3-6.8 MG/ML ophthalmic solution, 1 drop 2 (two) times daily., Disp: , Rfl:    gabapentin (NEURONTIN) 300 MG capsule, TAKE 1 CAPSULE BY MOUTH EVERY DAY, Disp: 90 capsule, Rfl: 1   glipiZIDE (GLUCOTROL) 5 MG tablet, Take 1 tablet (5 mg total) by mouth 2 (two) times daily before a meal. (Patient taking differently: Take 5 mg by mouth daily.), Disp: 180 tablet, Rfl: 1   insulin degludec (TRESIBA FLEXTOUCH) 100 UNIT/ML FlexTouch Pen, Inject 30 Units into the skin daily. (Patient taking differently: Inject 33 Units into the skin daily.), Disp: 30 mL, Rfl: 3   lisinopril (ZESTRIL) 40 MG tablet, TAKE 1 TABLET BY MOUTH EVERY DAY, Disp: 90 tablet, Rfl: 3   meloxicam (MOBIC) 15 MG tablet, Take 15 mg by mouth daily., Disp: , Rfl:    nitrofurantoin, macrocrystal-monohydrate, (MACROBID) 100 MG capsule, Take 1 capsule (100 mg total) by mouth 2 (two)  times daily for 5 days., Disp: 10 capsule, Rfl: 0   tirzepatide (MOUNJARO) 7.5 MG/0.5ML Pen, Inject 7.5 mg into the skin once a week., Disp: 2 mL, Rfl: 0   ACCU-CHEK GUIDE test strip, USE TO CHECK BLOOD SUGARS 2 TIMES DAILY. DX CODE:E11.65 (Patient not taking: Reported on 08/16/2022), Disp: 100 strip, Rfl: 11   Accu-Chek Softclix Lancets lancets, USE TO CHECK BLOOD SUGARS 2 TIMES DAILY. DX CODE:E11.65 (Patient not taking: Reported on 08/16/2022), Disp: 100 each, Rfl: 11   albuterol (VENTOLIN HFA) 108 (90 Base) MCG/ACT inhaler, TAKE 2 PUFFS  BY MOUTH EVERY 6 HOURS AS NEEDED FOR WHEEZE OR SHORTNESS OF BREATH (Patient not taking: Reported on 09/23/2022), Disp: 8.5 each, Rfl: 1   Blood Glucose Monitoring Suppl (ACCU-CHEK GUIDE ME) w/Device KIT, Use to check blood sugar 3 times a day. Dx code e11.65 (Patient not taking: Reported on 08/16/2022), Disp: 1 kit, Rfl: 1   No Known Allergies   Review of Systems  Constitutional: Negative.   Eyes:  Negative for blurred vision.  Respiratory: Negative.  Negative for shortness of breath.   Cardiovascular: Negative.  Negative for chest pain and palpitations.  Gastrointestinal: Negative.   Skin: Negative.   Neurological: Negative.   Psychiatric/Behavioral: Negative.       Today's Vitals   09/23/22 1545 09/23/22 1555  BP: 134/80 128/76  Pulse: 87   Temp: 98.2 F (36.8 C)   TempSrc: Oral   Weight: 171 lb 6.4 oz (77.7 kg)   Height: 5\' 4"  (1.626 m)   PainSc: 0-No pain    Body mass index is 29.42 kg/m.  Wt Readings from Last 3 Encounters:  09/23/22 171 lb 6.4 oz (77.7 kg)  08/16/22 173 lb (78.5 kg)  08/07/22 174 lb (78.9 kg)    Objective:  Physical Exam Vitals and nursing note reviewed.  Constitutional:      Appearance: Normal appearance.  HENT:     Head: Normocephalic and atraumatic.     Nose:     Comments: Masked     Mouth/Throat:     Comments: Masked  Eyes:     Extraocular Movements: Extraocular movements intact.  Cardiovascular:     Rate and Rhythm: Normal rate and regular rhythm.     Heart sounds: Normal heart sounds.  Pulmonary:     Effort: Pulmonary effort is normal.     Breath sounds: Normal breath sounds.  Musculoskeletal:     Cervical back: Normal range of motion.  Skin:    General: Skin is warm.  Neurological:     General: No focal deficit present.     Mental Status: She is alert.  Psychiatric:        Mood and Affect: Mood normal.        Behavior: Behavior normal.    Assessment And Plan:     1. Type 2 diabetes mellitus with proliferative retinopathy  of both eyes, with long-term current use of insulin, macular edema presence unspecified, unspecified proliferative retinopathy type ([place]) Comments: Chronic, she agrees to increase Mounjaro to 7.5mg  weekly. I suspect her sugars will continue to improve with higher dosage. She will f/u in 3 months. - BMP8+eGFR - Hemoglobin A1c - Microalbumin / Creatinine Urine Ratio - POCT Urinalysis Dipstick (81002)  2. Benign hypertension Comments: Chronic, controlled. She will c/w lisnopril 40mg  daily.  She is  encouraged to follow a low sodium diet. - POCT Urinalysis Dipstick (81002)  3. Leukocytes in urine Comments: Due to underlying DM, I will send off urine culture. -  Culture, Urine  4. Overweight with body mass index (BMI) of 29 to 29.9 in adult Comments: She is encouraged to aim for at least 150 minutes of exercise/week. Her BMi is acceptable for her demographics.     Patient was given opportunity to ask questions. Patient verbalized understanding of the plan and was able to repeat key elements of the plan. All questions were answered to their satisfaction.   I, [name], MD, have reviewed all documentation for this visit. The documentation on 09/23/22 for the exam, diagnosis, procedures, and orders are all accurate and complete.   IF YOU HAVE BEEN REFERRED TO A SPECIALIST, IT MAY TAKE 1-2 WEEKS TO SCHEDULE/PROCESS THE REFERRAL. IF YOU HAVE NOT HEARD FROM US/SPECIALIST IN TWO WEEKS, PLEASE GIVE [place] A CALL AT [phone].   THE PATIENT IS ENCOURAGED TO PRACTICE SOCIAL DISTANCING DUE TO THE COVID-19 PANDEMIC.

## 2022-09-23 NOTE — Patient Instructions (Signed)
Hypertension, Adult ?Hypertension is another name for high blood pressure. High blood pressure forces your heart to work harder to pump blood. This can cause problems over time. ?There are two numbers in a blood pressure reading. There is a top number (systolic) over a bottom number (diastolic). It is best to have a blood pressure that is below 120/80. ?What are the causes? ?The cause of this condition is not known. Some other conditions can lead to high blood pressure. ?What increases the risk? ?Some lifestyle factors can make you more likely to develop high blood pressure: ?Smoking. ?Not getting enough exercise or physical activity. ?Being overweight. ?Having too much fat, sugar, calories, or salt (sodium) in your diet. ?Drinking too much alcohol. ?Other risk factors include: ?Having any of these conditions: ?Heart disease. ?Diabetes. ?High cholesterol. ?Kidney disease. ?Obstructive sleep apnea. ?Having a family history of high blood pressure and high cholesterol. ?Age. The risk increases with age. ?Stress. ?What are the signs or symptoms? ?High blood pressure may not cause symptoms. Very high blood pressure (hypertensive crisis) may cause: ?Headache. ?Fast or uneven heartbeats (palpitations). ?Shortness of breath. ?Nosebleed. ?Vomiting or feeling like you may vomit (nauseous). ?Changes in how you see. ?Very bad chest pain. ?Feeling dizzy. ?Seizures. ?How is this treated? ?This condition is treated by making healthy lifestyle changes, such as: ?Eating healthy foods. ?Exercising more. ?Drinking less alcohol. ?Your doctor may prescribe medicine if lifestyle changes do not help enough and if: ?Your top number is above 130. ?Your bottom number is above 80. ?Your personal target blood pressure may vary. ?Follow these instructions at home: ?Eating and drinking ? ?If told, follow the DASH eating plan. To follow this plan: ?Fill one half of your plate at each meal with fruits and vegetables. ?Fill one fourth of your plate  at each meal with whole grains. Whole grains include whole-wheat pasta, brown rice, and whole-grain bread. ?Eat or drink low-fat dairy products, such as skim milk or low-fat yogurt. ?Fill one fourth of your plate at each meal with low-fat (lean) proteins. Low-fat proteins include fish, chicken without skin, eggs, beans, and tofu. ?Avoid fatty meat, cured and processed meat, or chicken with skin. ?Avoid pre-made or processed food. ?Limit the amount of salt in your diet to less than 1,500 mg each day. ?Do not drink alcohol if: ?Your doctor tells you not to drink. ?You are pregnant, may be pregnant, or are planning to become pregnant. ?If you drink alcohol: ?Limit how much you have to: ?0-1 drink a day for women. ?0-2 drinks a day for men. ?Know how much alcohol is in your drink. In the [place] one drink equals one 12 oz bottle of beer (355 mL), one 5 oz glass of wine (148 mL), or one 1? oz glass of hard liquor (44 mL). ?Lifestyle ? ?Work with your doctor to stay at a healthy weight or to lose weight. Ask your doctor what the best weight is for you. ?Get at least 30 minutes of exercise that causes your heart to beat faster (aerobic exercise) most days of the week. This may include walking, swimming, or biking. ?Get at least 30 minutes of exercise that strengthens your muscles (resistance exercise) at least 3 days a week. This may include lifting weights or doing Pilates. ?Do not smoke or use any products that contain nicotine or tobacco. If you need help quitting, ask your doctor. ?Check your blood pressure at home as told by your doctor. ?Keep all follow-up visits. ?Medicines ?Take over-the-counter and prescription medicines   only as told by your doctor. Follow directions carefully. ?Do not skip doses of blood pressure medicine. The medicine does not work as well if you skip doses. Skipping doses also puts you at risk for problems. ?Ask your doctor about side effects or reactions to medicines that you should watch  for. ?Contact a doctor if: ?You think you are having a reaction to the medicine you are taking. ?You have headaches that keep coming back. ?You feel dizzy. ?You have swelling in your ankles. ?You have trouble with your vision. ?Get help right away if: ?You get a very bad headache. ?You start to feel mixed up (confused). ?You feel weak or numb. ?You feel faint. ?You have very bad pain in your: ?Chest. ?Belly (abdomen). ?You vomit more than once. ?You have trouble breathing. ?These symptoms may be an emergency. Get help right away. Call 911. ?Do not wait to see if the symptoms will go away. ?Do not drive yourself to the hospital. ?Summary ?Hypertension is another name for high blood pressure. ?High blood pressure forces your heart to work harder to pump blood. ?For most people, a normal blood pressure is less than 120/80. ?Making healthy choices can help lower blood pressure. If your blood pressure does not get lower with healthy choices, you may need to take medicine. ?This information is not intended to replace advice given to you by your health care provider. Make sure you discuss any questions you have with your health care provider. ?Document Revised: 04/12/2021 Document Reviewed: 04/12/2021 ?Elsevier Patient Education ? 2023 Elsevier Inc[employer] ? ?

## 2022-09-24 LAB — BMP8+EGFR
BUN/Creatinine Ratio: 22 (ref 12–28)
BUN: 17 mg/dL (ref 8–27)
CO2: 20 mmol/L (ref 20–29)
Calcium: 9.6 mg/dL (ref 8.7–10.3)
Chloride: 107 mmol/L — ABNORMAL HIGH (ref 96–106)
Creatinine, Ser: 0.78 mg/dL (ref 0.57–1.00)
Glucose: 201 mg/dL — ABNORMAL HIGH (ref 70–99)
Potassium: 4.5 mmol/L (ref 3.5–5.2)
Sodium: 147 mmol/L — ABNORMAL HIGH (ref 134–144)
eGFR: 83 mL/min/{1.73_m2} (ref >59)

## 2022-09-24 LAB — HEMOGLOBIN A1C
Est. average glucose Bld gHb Est-mCnc: 220 mg/dL
Hgb A1c MFr Bld: 9.3 % — ABNORMAL HIGH (ref 4.8–5.6)

## 2022-09-24 LAB — MICROALBUMIN / CREATININE URINE RATIO
Creatinine, Urine: 164.3 mg/dL
Microalb/Creat Ratio: 26 mg/g creat (ref 0–29)
Microalbumin, Urine: 42.2 ug/mL

## 2022-09-26 ENCOUNTER — Other Ambulatory Visit: Payer: Self-pay | Admitting: Internal Medicine

## 2022-09-26 ENCOUNTER — Encounter: Payer: Self-pay | Admitting: Internal Medicine

## 2022-09-26 LAB — URINE CULTURE

## 2022-09-26 MED ORDER — NITROFURANTOIN MONOHYD MACRO 100 MG PO CAPS: 100.0000 mg | ORAL_CAPSULE | Freq: Two times a day (BID) | ORAL | 0 refills | Status: AC

## 2022-10-06 ENCOUNTER — Other Ambulatory Visit: Payer: Self-pay | Admitting: Internal Medicine

## 2022-10-07 ENCOUNTER — Other Ambulatory Visit: Payer: Self-pay

## 2022-10-07 ENCOUNTER — Other Ambulatory Visit: Payer: Self-pay | Admitting: Internal Medicine

## 2022-10-07 MED ORDER — DEXCOM G7 RECEIVER DEVI: 1 refills | Status: DC

## 2022-10-07 MED ORDER — DEXCOM G7 SENSOR MISC: 2 refills | Status: DC

## 2022-10-08 ENCOUNTER — Other Ambulatory Visit: Payer: Self-pay

## 2022-10-08 MED ORDER — MOUNJARO 5 MG/0.5ML ~~LOC~~ SOAJ: 5.0000 mg | SUBCUTANEOUS | 1 refills | Status: DC

## 2022-10-08 NOTE — Telephone Encounter (Signed)
Pharmacy comment: Product Backordered/Unavailable:ON A LONG TERM BACK ORDER. PLEASE SEND ALTERNATIVE.

## 2022-10-09 MED ORDER — MOUNJARO 7.5 MG/0.5ML ~~LOC~~ SOAJ: SUBCUTANEOUS | 1 refills | Status: DC

## 2022-10-09 NOTE — Addendum Note (Signed)
Addended by: Wadie Lessen on: 10/09/2022 04:42 PM   Modules accepted: Orders

## 2022-10-15 ENCOUNTER — Other Ambulatory Visit: Payer: Self-pay

## 2022-10-15 MED ORDER — MOUNJARO 5 MG/0.5ML ~~LOC~~ SOAJ: 5.0000 mg | SUBCUTANEOUS | 1 refills | Status: DC

## 2022-10-25 ENCOUNTER — Ambulatory Visit
Admission: RE | Admit: 2022-10-25 | Discharge: 2022-10-25 | Disposition: A | Discharge status: Home / Self Care | Payer: Medicare Other | Source: Ambulatory Visit | Attending: Internal Medicine | Admitting: Internal Medicine

## 2022-10-25 DIAGNOSIS — Z1231 Encounter for screening mammogram for malignant neoplasm of breast: Secondary | ICD-10-CM | POA: Diagnosis not present

## 2022-11-01 ENCOUNTER — Other Ambulatory Visit: Payer: Self-pay | Admitting: Internal Medicine

## 2022-11-01 DIAGNOSIS — E113511 Type 2 diabetes mellitus with proliferative diabetic retinopathy with macular edema, right eye: Secondary | ICD-10-CM | POA: Diagnosis not present

## 2022-11-11 DIAGNOSIS — M25572 Pain in left ankle and joints of left foot: Secondary | ICD-10-CM | POA: Diagnosis not present

## 2022-11-13 ENCOUNTER — Other Ambulatory Visit: Payer: Self-pay

## 2022-11-13 MED ORDER — MOUNJARO 5 MG/0.5ML ~~LOC~~ SOAJ: 5.0000 mg | SUBCUTANEOUS | 1 refills | Status: DC

## 2022-12-04 ENCOUNTER — Other Ambulatory Visit: Payer: Self-pay | Admitting: Internal Medicine

## 2022-12-06 ENCOUNTER — Ambulatory Visit: Payer: Self-pay

## 2022-12-06 NOTE — Patient Instructions (Signed)
Visit Information  Thank you for taking time to visit with me today. Please don't hesitate to contact me if I can be of assistance to you.   Following are the goals we discussed today:  - Contact your primary care provider as needed   If you are experiencing a Mental Health or Behavioral Health Crisis or need someone to talk to, please call 1-800-273-TALK (toll free, 24 hour hotline) go to [facility] [address] [place] [phone]) call 911  Patient verbalizes understanding of instructions and care plan provided today and agrees to view in MyChart. Active MyChart status and patient understanding of how to access instructions and care plan via MyChart confirmed with patient.     No further follow up required: Please contact the care coordination team as needed.  Bevelyn Ngo, BSW, CDP Social Worker, Certified Dementia Practitioner [facility] Care Management  Care Coordination [phone]

## 2022-12-06 NOTE — Patient Outreach (Signed)
  Care Coordination   Initial Visit Note   12/06/2022 Name: Sharon Mcdonald MRN: [id number] DOB: 08-03-1955  Sharon Mcdonald is a 67 y.o. year old female who sees Dorothyann Peng, MD for primary care. I spoke with  Sharon Mcdonald by phone today.  What matters to the patients health and wellness today?  No concerns, patient is doing well at this time.    Goals Addressed             This Visit's Progress    COMPLETED: Care Coordination Activities       Care Coordination Interventions: [facility] screening reviewed - no acute resource challenges identified at this time Determined the patient does not have concerns with medication costs and/or adherence at this time Education provided on the role of the care coordination team - no follow up desired at this time Encouraged the patient to contact her primary care provider as needed         SDOH assessments and interventions completed:  Yes  SDOH Interventions Today    Flowsheet Row Most Recent Value  SDOH Interventions   Food Insecurity Interventions Intervention Not Indicated  Housing Interventions Intervention Not Indicated  Transportation Interventions Intervention Not Indicated        Care Coordination Interventions:  Yes, provided   Interventions Today    Flowsheet Row Most Recent Value  Chronic Disease   Chronic disease during today's visit Diabetes  General Interventions   General Interventions Discussed/Reviewed General Interventions Discussed, Doctor Visits  Doctor Visits Discussed/Reviewed Doctor Visits Reviewed  Education Interventions   Education Provided Provided Education  [educated on the role of the care coordination team]       Follow up plan: No further intervention required.   Encounter Outcome:  Pt. Visit Completed   Bevelyn Ngo, BSW, CDP Social Worker, Certified Dementia Practitioner [facility] Care Management  Care Coordination [phone]

## 2022-12-13 ENCOUNTER — Other Ambulatory Visit: Payer: Self-pay

## 2022-12-13 ENCOUNTER — Encounter: Payer: Self-pay | Admitting: Internal Medicine

## 2022-12-13 MED ORDER — MOUNJARO 5 MG/0.5ML ~~LOC~~ SOAJ: 5.0000 mg | SUBCUTANEOUS | 1 refills | Status: DC

## 2023-01-13 ENCOUNTER — Other Ambulatory Visit: Payer: Self-pay

## 2023-01-13 DIAGNOSIS — Z794 Long term (current) use of insulin: Secondary | ICD-10-CM

## 2023-01-13 MED ORDER — MOUNJARO 7.5 MG/0.5ML ~~LOC~~ SOAJ
7.5000 mg | SUBCUTANEOUS | 1 refills | Status: DC
Start: 2023-01-13 — End: 2023-02-17

## 2023-01-16 ENCOUNTER — Encounter: Payer: Self-pay | Admitting: Family Medicine

## 2023-01-16 ENCOUNTER — Ambulatory Visit (INDEPENDENT_AMBULATORY_CARE_PROVIDER_SITE_OTHER): Payer: Medicare Other | Admitting: Family Medicine

## 2023-01-16 VITALS — BP 130/82 | HR 95 | Temp 98.2°F | Ht 64.0 in | Wt 174.2 lb

## 2023-01-16 DIAGNOSIS — Z2821 Immunization not carried out because of patient refusal: Secondary | ICD-10-CM | POA: Diagnosis not present

## 2023-01-16 DIAGNOSIS — M62830 Muscle spasm of back: Secondary | ICD-10-CM | POA: Diagnosis not present

## 2023-01-16 DIAGNOSIS — M545 Low back pain, unspecified: Secondary | ICD-10-CM | POA: Diagnosis not present

## 2023-01-16 MED ORDER — METHOCARBAMOL 500 MG PO TABS
500.0000 mg | ORAL_TABLET | Freq: Two times a day (BID) | ORAL | 0 refills | Status: DC | PRN
Start: 2023-01-16 — End: 2023-02-10

## 2023-01-16 MED ORDER — BIOFREEZE COOL THE PAIN 4 % EX GEL
1.0000 [in_us] | Freq: Two times a day (BID) | CUTANEOUS | 2 refills | Status: DC | PRN
Start: 2023-01-16 — End: 2023-06-30

## 2023-01-16 MED ORDER — MELOXICAM 7.5 MG PO TABS
7.5000 mg | ORAL_TABLET | Freq: Every day | ORAL | 3 refills | Status: DC
Start: 2023-01-16 — End: 2023-06-30

## 2023-01-16 NOTE — Progress Notes (Signed)
I,[name], CMA,acting as a Neurosurgeon for [employer], NP.,have documented all relevant documentation on the behalf of Orvile Corona, NP,as directed by  Ahni Bradwell Moshe Salisbury, NP while in the presence of Yehuda Printup, NP.  Subjective:  Patient ID: Sharon Mcdonald , female    DOB: 27-Feb-1956 , 67 y.o.   MRN: [id number]  Chief Complaint  Patient presents with   Back Pain    HPI  Patient presents today for back pain that started a week ago.She states the pain is on the  upper right back that radiates to lower back. Patient denies having any fall or trauma to the back .  Patient reports she feels like she is having muscle spasms. Patient states she took some tylenol tablets for the first time last night. She rates her pain at 7/10.  Back Pain This is a new problem. The current episode started in the past 7 days. The problem occurs constantly. The problem is unchanged. The pain is present in the thoracic spine. The quality of the pain is described as aching. The pain is at a severity of 7/10. The pain is moderate. The pain is The same all the time. The symptoms are aggravated by bending and twisting. Pertinent negatives include no numbness, tingling or weakness. She has tried analgesics for the symptoms. The treatment provided no relief.     Past Medical History:  Diagnosis Date   Diabetes mellitus    Diabetic retinopathy ([facility])    High cholesterol    Hypertension      Family History  Problem Relation Age of Onset   Diabetes Mother    Hypertension Mother    Obesity Mother    Diabetes Brother      Current Outpatient Medications:    Continuous Blood Gluc Receiver (DEXCOM G7 RECEIVER) DEVI, USE AS DIRECTED., Disp: 1 each, Rfl: 1   Continuous Glucose Sensor (DEXCOM G7 SENSOR) MISC, USE AS DIRECTED., Disp: 3 each, Rfl: 1   dorzolamide-timolol (COSOPT) 22.3-6.8 MG/ML ophthalmic solution, 1 drop 2 (two) times daily., Disp: , Rfl:    gabapentin (NEURONTIN) 300 MG capsule, TAKE 1 CAPSULE BY MOUTH  EVERY DAY, Disp: 90 capsule, Rfl: 1   glipiZIDE (GLUCOTROL) 5 MG tablet, Take 1 tablet (5 mg total) by mouth daily., Disp: 90 tablet, Rfl: 1   insulin degludec (TRESIBA FLEXTOUCH) 100 UNIT/ML FlexTouch Pen, Inject 30 Units into the skin daily. (Patient taking differently: Inject 33 Units into the skin daily.), Disp: 30 mL, Rfl: 3   lisinopril (ZESTRIL) 40 MG tablet, TAKE 1 TABLET BY MOUTH EVERY DAY, Disp: 90 tablet, Rfl: 3   meloxicam (MOBIC) 7.5 MG tablet, Take 1 tablet (7.5 mg total) by mouth daily., Disp: 30 tablet, Rfl: 3   Menthol, Topical Analgesic, (BIOFREEZE COOL THE PAIN) 4 % GEL, Apply 1 inch topically 2 (two) times daily as needed., Disp: 3 mL, Rfl: 2   methocarbamol (ROBAXIN) 500 MG tablet, Take 1 tablet (500 mg total) by mouth 2 (two) times daily as needed for muscle spasms., Disp: 60 tablet, Rfl: 0   tirzepatide (MOUNJARO) 7.5 MG/0.5ML Pen, Inject 7.5 mg into the skin once a week., Disp: 2 mL, Rfl: 1   atorvastatin (LIPITOR) 20 MG tablet, Take 1 tablet (20 mg total) by mouth daily., Disp: 90 tablet, Rfl: 3   BD PEN NEEDLE NANO 2ND GEN 32G X 4 MM MISC, USE DAILY IN THE AFTERNOON, Disp: 100 each, Rfl: 3   No Known Allergies   Review of Systems  Constitutional:  Negative.   Eyes: Negative.   Respiratory: Negative.    Cardiovascular: Negative.   Genitourinary: Negative.   Musculoskeletal:  Positive for back pain.  Skin: Negative.   Neurological:  Negative for tingling, weakness and numbness.  Psychiatric/Behavioral: Negative.       Today's Vitals   01/16/23 1521  BP: 130/82  Pulse: 95  Temp: 98.2 F (36.8 C)  Weight: 174 lb 3.2 oz (79 kg)  Height: 5\' 4"  (1.626 m)  PainSc: 7    Body mass index is 29.9 kg/m.  Wt Readings from Last 3 Encounters:  01/16/23 174 lb 3.2 oz (79 kg)  09/23/22 171 lb 6.4 oz (77.7 kg)  08/16/22 173 lb (78.5 kg)     Objective:  Physical Exam Cardiovascular:     Rate and Rhythm: Normal rate.  Pulmonary:     Effort: Pulmonary effort is  normal.     Breath sounds: Normal breath sounds.  Musculoskeletal:       Arms:     Cervical back: Normal range of motion.     Thoracic back: Spasms and tenderness present.     Lumbar back: Spasms and tenderness present.  Skin:    General: Skin is warm and dry.  Neurological:     Mental Status: She is alert and oriented to person, place, and time.  Psychiatric:        Mood and Affect: Mood normal.         Assessment And Plan:  Acute right-sided low back pain without sciatica Assessment & Plan: May apply topical analgesic to back as needed.  Orders: -     Meloxicam; Take 1 tablet (7.5 mg total) by mouth daily.  Dispense: 30 tablet; Refill: 3  Spasm of back muscles Assessment & Plan: Take muscle relaxer as prescribed  Orders: -     Methocarbamol; Take 1 tablet (500 mg total) by mouth 2 (two) times daily as needed for muscle spasms.  Dispense: 60 tablet; Refill: 0 -     Biofreeze Cool The Pain; Apply 1 inch topically 2 (two) times daily as needed.  Dispense: 3 mL; Refill: 2  Herpes zoster vaccination declined     Return if symptoms worsen or fail to improve.  Patient was given opportunity to ask questions. Patient verbalized understanding of the plan and was able to repeat key elements of the plan. All questions were answered to their satisfaction.  Chandra Feger Moshe Salisbury, NP  I, Ema Hebner Moshe Salisbury, NP, have reviewed all documentation for this visit. The documentation on 01/24/23 for the exam, diagnosis, procedures, and orders are all accurate and complete.   IF YOU HAVE BEEN REFERRED TO A SPECIALIST, IT MAY TAKE 1-2 WEEKS TO SCHEDULE/PROCESS THE REFERRAL. IF YOU HAVE NOT HEARD FROM US/SPECIALIST IN TWO WEEKS, PLEASE GIVE [place] A CALL AT [phone].   THE PATIENT IS ENCOURAGED TO PRACTICE SOCIAL DISTANCING DUE TO THE COVID-19 PANDEMIC.

## 2023-01-17 DIAGNOSIS — E113513 Type 2 diabetes mellitus with proliferative diabetic retinopathy with macular edema, bilateral: Secondary | ICD-10-CM | POA: Diagnosis not present

## 2023-01-17 DIAGNOSIS — H43813 Vitreous degeneration, bilateral: Secondary | ICD-10-CM | POA: Diagnosis not present

## 2023-01-17 DIAGNOSIS — H3582 Retinal ischemia: Secondary | ICD-10-CM | POA: Diagnosis not present

## 2023-01-23 ENCOUNTER — Ambulatory Visit: Payer: Medicare Other | Admitting: Internal Medicine

## 2023-01-24 DIAGNOSIS — M545 Low back pain, unspecified: Secondary | ICD-10-CM | POA: Insufficient documentation

## 2023-01-24 DIAGNOSIS — M62830 Muscle spasm of back: Secondary | ICD-10-CM | POA: Insufficient documentation

## 2023-01-24 NOTE — Assessment & Plan Note (Signed)
Take muscle relaxer as prescribed

## 2023-01-24 NOTE — Assessment & Plan Note (Signed)
May apply topical analgesic to back as needed.

## 2023-02-04 ENCOUNTER — Ambulatory Visit: Payer: Medicare Other | Admitting: Internal Medicine

## 2023-02-08 ENCOUNTER — Other Ambulatory Visit: Payer: Self-pay | Admitting: Family Medicine

## 2023-02-08 ENCOUNTER — Other Ambulatory Visit: Payer: Self-pay | Admitting: Internal Medicine

## 2023-02-08 DIAGNOSIS — M62830 Muscle spasm of back: Secondary | ICD-10-CM

## 2023-02-15 ENCOUNTER — Other Ambulatory Visit: Payer: Self-pay | Admitting: Internal Medicine

## 2023-02-17 ENCOUNTER — Other Ambulatory Visit: Payer: Self-pay

## 2023-02-17 ENCOUNTER — Encounter: Payer: Self-pay | Admitting: Internal Medicine

## 2023-02-17 ENCOUNTER — Ambulatory Visit (INDEPENDENT_AMBULATORY_CARE_PROVIDER_SITE_OTHER): Payer: Medicare Other | Admitting: Internal Medicine

## 2023-02-17 VITALS — BP 124/78 | HR 99 | Temp 97.5°F | Ht 64.0 in | Wt 167.4 lb

## 2023-02-17 DIAGNOSIS — Z0001 Encounter for general adult medical examination with abnormal findings: Secondary | ICD-10-CM

## 2023-02-17 DIAGNOSIS — Z0000 Encounter for general adult medical examination without abnormal findings: Secondary | ICD-10-CM

## 2023-02-17 DIAGNOSIS — E113593 Type 2 diabetes mellitus with proliferative diabetic retinopathy without macular edema, bilateral: Secondary | ICD-10-CM

## 2023-02-17 DIAGNOSIS — I10 Essential (primary) hypertension: Secondary | ICD-10-CM | POA: Diagnosis not present

## 2023-02-17 DIAGNOSIS — Z6828 Body mass index (BMI) 28.0-28.9, adult: Secondary | ICD-10-CM

## 2023-02-17 DIAGNOSIS — Z794 Long term (current) use of insulin: Secondary | ICD-10-CM

## 2023-02-17 DIAGNOSIS — Z79899 Other long term (current) drug therapy: Secondary | ICD-10-CM

## 2023-02-17 DIAGNOSIS — R197 Diarrhea, unspecified: Secondary | ICD-10-CM | POA: Diagnosis not present

## 2023-02-17 DIAGNOSIS — E663 Overweight: Secondary | ICD-10-CM

## 2023-02-17 DIAGNOSIS — R61 Generalized hyperhidrosis: Secondary | ICD-10-CM | POA: Diagnosis not present

## 2023-02-17 DIAGNOSIS — R5383 Other fatigue: Secondary | ICD-10-CM | POA: Diagnosis not present

## 2023-02-17 DIAGNOSIS — E1121 Type 2 diabetes mellitus with diabetic nephropathy: Secondary | ICD-10-CM | POA: Diagnosis not present

## 2023-02-17 MED ORDER — DEXCOM G7 RECEIVER DEVI: 1 refills | Status: DC

## 2023-02-17 MED ORDER — DEXCOM G7 SENSOR MISC: 1 refills | Status: DC

## 2023-02-17 MED ORDER — CYANOCOBALAMIN 1000 MCG/ML IJ SOLN
1000.0000 ug | Freq: Once | INTRAMUSCULAR | Status: AC
Start: 2023-02-17 — End: 2023-02-17
  Administered 2023-02-17: 1000 ug via INTRAMUSCULAR

## 2023-02-17 MED ORDER — MOUNJARO 7.5 MG/0.5ML ~~LOC~~ SOAJ: 7.5000 mg | SUBCUTANEOUS | 1 refills | Status: DC

## 2023-02-17 NOTE — Patient Instructions (Signed)

## 2023-02-17 NOTE — Progress Notes (Signed)
I,Victoria T Deloria Lair, CMA,acting as a Neurosurgeon for Gwynneth Aliment, MD.,have documented all relevant documentation on the behalf of Gwynneth Aliment, MD,as directed by  Gwynneth Aliment, MD while in the presence of Gwynneth Aliment, MD.  Subjective:    Patient ID: Sharon Mcdonald , female    DOB: July 23, 1955 , 67 y.o.   MRN: [id number]  Chief Complaint  Patient presents with   Annual Exam   Diabetes   Hypertension    HPI  Patient presents today for annual exam. She reports compliance with medications. Denies headache, chest pain, and SOB. She admits being extremely tired. She does not know the cause. She also would like discuss hot flashes & frequent night sweats.  She is no longer established with GYN.   She reports not yet completing DM eye exam this year.   Diabetes She presents for her follow-up diabetic visit. She has type 2 diabetes mellitus. Her disease course has been stable. There are no hypoglycemic associated symptoms. Associated symptoms include fatigue. Pertinent negatives for diabetes include no blurred vision and no chest pain. There are no hypoglycemic complications. Diabetic complications include nephropathy. Risk factors for coronary artery disease include diabetes mellitus, dyslipidemia, hypertension and post-menopausal. She is following a diabetic diet. She participates in exercise three times a week. Her breakfast blood glucose is taken between 7-8 am. Her breakfast blood glucose range is generally 90-110 mg/dl. An ACE inhibitor/angiotensin II receptor blocker is being taken. Eye exam is current.  Hypertension This is a chronic problem. The current episode started more than 1 year ago. The problem is unchanged. The problem is controlled. Pertinent negatives include no blurred vision, chest pain, palpitations or shortness of breath.     Past Medical History:  Diagnosis Date   Diabetes mellitus    Diabetic retinopathy (HCC)    High cholesterol    Hypertension       Family History  Problem Relation Age of Onset   Diabetes Mother    Hypertension Mother    Obesity Mother    Diabetes Brother      Current Outpatient Medications:    atorvastatin (LIPITOR) 20 MG tablet, TAKE 1 TABLET BY MOUTH EVERY DAY, Disp: 90 tablet, Rfl: 0   BD PEN NEEDLE NANO 2ND GEN 32G X 4 MM MISC, USE DAILY IN THE AFTERNOON, Disp: 100 each, Rfl: 3   dorzolamide-timolol (COSOPT) 22.3-6.8 MG/ML ophthalmic solution, 1 drop 2 (two) times daily., Disp: , Rfl:    gabapentin (NEURONTIN) 300 MG capsule, TAKE 1 CAPSULE BY MOUTH EVERY DAY, Disp: 90 capsule, Rfl: 1   glipiZIDE (GLUCOTROL) 5 MG tablet, Take 1 tablet (5 mg total) by mouth daily., Disp: 90 tablet, Rfl: 1   insulin degludec (TRESIBA FLEXTOUCH) 100 UNIT/ML FlexTouch Pen, Inject 30 Units into the skin daily. (Patient taking differently: Inject 33 Units into the skin daily.), Disp: 30 mL, Rfl: 3   lisinopril (ZESTRIL) 40 MG tablet, TAKE 1 TABLET BY MOUTH EVERY DAY, Disp: 90 tablet, Rfl: 3   meloxicam (MOBIC) 7.5 MG tablet, Take 1 tablet (7.5 mg total) by mouth daily., Disp: 30 tablet, Rfl: 3   Menthol, Topical Analgesic, (BIOFREEZE COOL THE PAIN) 4 % GEL, Apply 1 inch topically 2 (two) times daily as needed., Disp: 3 mL, Rfl: 2   methocarbamol (ROBAXIN) 500 MG tablet, TAKE 1 TABLET (500 MG TOTAL) BY MOUTH 2 (TWO) TIMES DAILY AS NEEDED FOR MUSCLE SPASMS., Disp: 60 tablet, Rfl: 0   Continuous Glucose Receiver (DEXCOM G7 RECEIVER)  DEVI, USE AS DIRECTED., Disp: 1 each, Rfl: 1   Continuous Glucose Sensor (DEXCOM G7 SENSOR) MISC, USE AS DIRECTED., Disp: 3 each, Rfl: 1   tirzepatide (MOUNJARO) 7.5 MG/0.5ML Pen, Inject 7.5 mg into the skin once a week., Disp: 2 mL, Rfl: 1   No Known Allergies    The patient states she uses post menopausal status for birth control. No LMP recorded. Patient is postmenopausal.. Negative for Dysmenorrhea. Negative for: breast discharge, breast lump(s), breast pain and breast self exam. Associated symptoms  include abnormal vaginal bleeding. Pertinent negatives include abnormal bleeding (hematology), anxiety, decreased libido, depression, difficulty falling sleep, dyspareunia, history of infertility, nocturia, sexual dysfunction, sleep disturbances, urinary incontinence, urinary urgency, vaginal discharge and vaginal itching. Diet regular.The patient states her exercise level is  intermittent.  . The patient's tobacco use is:  Social History   Tobacco Use  Smoking Status Former   Current packs/day: 0.00   Types: Cigarettes   Quit date: 09/07/1978   Years since quitting: 44.4  Smokeless Tobacco Never  Tobacco Comments   1/2 pack per week  . She has been exposed to passive smoke. The patient's alcohol use is:  Social History   Substance and Sexual Activity  Alcohol Use No    Review of Systems  Constitutional:  Positive for fatigue.  HENT: Negative.    Eyes: Negative.  Negative for blurred vision.  Respiratory: Negative.  Negative for shortness of breath.   Cardiovascular: Negative.  Negative for chest pain and palpitations.  Gastrointestinal:  Positive for diarrhea.  Endocrine: Negative.   Genitourinary: Negative.   Musculoskeletal: Negative.   Skin: Negative.   Allergic/Immunologic: Negative.   Neurological: Negative.   Hematological: Negative.   Psychiatric/Behavioral: Negative.       Today's Vitals   02/17/23 1407  BP: 124/78  Pulse: 99  Temp: (!) 97.5 F (36.4 C)  SpO2: 98%  Weight: 167 lb 6.4 oz (75.9 kg)  Height: 5\' 4"  (1.626 m)   Body mass index is 28.73 kg/m.  Wt Readings from Last 3 Encounters:  02/17/23 167 lb 6.4 oz (75.9 kg)  01/16/23 174 lb 3.2 oz (79 kg)  09/23/22 171 lb 6.4 oz (77.7 kg)     Objective:  Physical Exam Vitals and nursing note reviewed.  Constitutional:      Appearance: Normal appearance.  HENT:     Head: Normocephalic and atraumatic.     Right Ear: Tympanic membrane, ear canal and external ear normal.     Left Ear: Tympanic  membrane, ear canal and external ear normal.     Nose: Nose normal.     Mouth/Throat:     Mouth: Mucous membranes are moist.     Pharynx: Oropharynx is clear.  Eyes:     Extraocular Movements: Extraocular movements intact.     Conjunctiva/sclera: Conjunctivae normal.     Pupils: Pupils are equal, round, and reactive to light.  Cardiovascular:     Rate and Rhythm: Normal rate and regular rhythm.     Pulses: Normal pulses.          Dorsalis pedis pulses are 2+ on the right side and 2+ on the left side.     Heart sounds: Normal heart sounds.  Pulmonary:     Effort: Pulmonary effort is normal.     Breath sounds: Normal breath sounds.  Chest:  Breasts:    Tanner Score is 5.     Right: Normal.     Left: Normal.  Abdominal:  General: Abdomen is flat. Bowel sounds are normal.     Palpations: Abdomen is soft.  Genitourinary:    Comments: deferred Musculoskeletal:        General: Normal range of motion.     Cervical back: Normal range of motion and neck supple.  Feet:     Right foot:     Protective Sensation: 5 sites tested.  5 sites sensed.     Skin integrity: Dry skin present.     Toenail Condition: Right toenails are normal.     Left foot:     Protective Sensation: 5 sites tested.  5 sites sensed.     Skin integrity: Dry skin present.     Toenail Condition: Left toenails are normal.  Skin:    General: Skin is warm and dry.  Neurological:     General: No focal deficit present.     Mental Status: She is alert and oriented to person, place, and time.  Psychiatric:        Mood and Affect: Mood normal.        Behavior: Behavior normal.         Assessment And Plan:     Encounter for annual health examination Assessment & Plan: A full exam was performed.  Importance of monthly self breast exams was discussed with the patient.  She is advised to get 3045 minutes of regular exercise, no less than four to five days per week. Both weight-bearing and aerobic exercises are  recommended.  She is advised to follow a healthy diet with at least six fruits/veggies per day, decrease intake of red meat and other saturated fats and to increase fish intake to twice weekly.  Meats/fish should not be fried -- baked, boiled or broiled is preferable. It is also important to cut back on your sugar intake.  Be sure to read labels - try to avoid anything with added sugar, high fructose corn syrup or other sweeteners.  If you must use a sweetener, you can try stevia or monkfruit.  It is also important to avoid artificially sweetened foods/beverages and diet drinks. Lastly, wear SPF 50 sunscreen on exposed skin and when in direct sunlight for an extended period of time.  Be sure to avoid fast food restaurants and aim for at least 60 ounces of water daily.       Type 2 diabetes mellitus with proliferative retinopathy of both eyes, with long-term current use of insulin, macular edema presence unspecified, unspecified proliferative retinopathy type (HCC) Assessment & Plan: Chronic, diabetic foot exam was performed. NO med changes. She will f/u in three to four months. I DISCUSSED WITH THE PATIENT AT LENGTH REGARDING THE GOALS OF GLYCEMIC CONTROL AND POSSIBLE LONG-TERM COMPLICATIONS.  I  ALSO STRESSED THE IMPORTANCE OF COMPLIANCE WITH HOME GLUCOSE MONITORING, DIETARY RESTRICTIONS INCLUDING AVOIDANCE OF SUGARY DRINKS/PROCESSED FOODS,  ALONG WITH REGULAR EXERCISE.  I  ALSO STRESSED THE IMPORTANCE OF ANNUAL EYE EXAMS, SELF FOOT CARE AND COMPLIANCE WITH OFFICE VISITS.   Orders: -     EKG 12-Lead -     CBC -     CMP14+EGFR -     Lipid panel -     Hemoglobin A1c  Benign hypertension Assessment & Plan: Chronic, controlled. She will continue with lisinopril 40mg  daily. EKG performed, NSR w/ RAE, nonspecific ST depression.  She is encouraged to follow low sodium diet.     Orders: -     EKG 12-Lead -     CMP14+EGFR -  Lipid panel  Other fatigue Assessment & Plan: She is reminded to stay  well hydrated. Also, importance of adequate protein intake while on Mounjaro was discussed with the patient. She was given vitamin B12 IM x 1.   Orders: -     Cyanocobalamin  Diarrhea, unspecified type Assessment & Plan: Intermittent.  Not currently on metformin. Encouraged to try to figure out if certain foods are exacerbating her symptoms. IF taking magnesium, should decrease the dose.    Night sweats Assessment & Plan: I will check a thyroid panel. She went through menopause years ago. She denies unintentional weight loss, fever and chills.   Orders: -     TSH + free T4  Overweight with body mass index (BMI) of 28 to 28.9 in adult Assessment & Plan: Her BMI is acceptable for her demographic. She is encouraged to aim for at least 150 minutes of exercise/week.    Drug therapy     Return for 1 year physical, 3 month dm check. Patient was given opportunity to ask questions. Patient verbalized understanding of the plan and was able to repeat key elements of the plan. All questions were answered to their satisfaction.   I, Gwynneth Aliment, MD, have reviewed all documentation for this visit. The documentation on 02/17/23 for the exam, diagnosis, procedures, and orders are all accurate and complete.

## 2023-02-21 DIAGNOSIS — E113511 Type 2 diabetes mellitus with proliferative diabetic retinopathy with macular edema, right eye: Secondary | ICD-10-CM | POA: Diagnosis not present

## 2023-02-23 DIAGNOSIS — I10 Essential (primary) hypertension: Secondary | ICD-10-CM | POA: Insufficient documentation

## 2023-02-23 DIAGNOSIS — R61 Generalized hyperhidrosis: Secondary | ICD-10-CM | POA: Insufficient documentation

## 2023-02-23 DIAGNOSIS — R197 Diarrhea, unspecified: Secondary | ICD-10-CM | POA: Insufficient documentation

## 2023-02-23 DIAGNOSIS — Z0000 Encounter for general adult medical examination without abnormal findings: Secondary | ICD-10-CM | POA: Insufficient documentation

## 2023-02-23 DIAGNOSIS — R5383 Other fatigue: Secondary | ICD-10-CM | POA: Insufficient documentation

## 2023-02-23 NOTE — Assessment & Plan Note (Signed)

## 2023-02-23 NOTE — Assessment & Plan Note (Signed)
I will check a thyroid panel. She went through menopause years ago. She denies unintentional weight loss, fever and chills.

## 2023-02-23 NOTE — Assessment & Plan Note (Signed)
She is reminded to stay well hydrated. Also, importance of adequate protein intake while on Mounjaro was discussed with the patient. She was given vitamin B12 IM x 1.

## 2023-02-23 NOTE — Assessment & Plan Note (Signed)
Chronic, controlled. She will continue with lisinopril 40mg  daily. EKG performed, NSR w/ RAE, nonspecific ST depression.  She is encouraged to follow low sodium diet.

## 2023-02-23 NOTE — Assessment & Plan Note (Signed)
Chronic, diabetic foot exam was performed. NO med changes. She will f/u in three to four months. I DISCUSSED WITH THE PATIENT AT LENGTH REGARDING THE GOALS OF GLYCEMIC CONTROL AND POSSIBLE LONG-TERM COMPLICATIONS.  I  ALSO STRESSED THE IMPORTANCE OF COMPLIANCE WITH HOME GLUCOSE MONITORING, DIETARY RESTRICTIONS INCLUDING AVOIDANCE OF SUGARY DRINKS/PROCESSED FOODS,  ALONG WITH REGULAR EXERCISE.  I  ALSO STRESSED THE IMPORTANCE OF ANNUAL EYE EXAMS, SELF FOOT CARE AND COMPLIANCE WITH OFFICE VISITS.

## 2023-02-23 NOTE — Assessment & Plan Note (Signed)
Her BMI is acceptable for her demographic. She is encouraged to aim for at least 150 minutes of exercise/week.

## 2023-02-23 NOTE — Assessment & Plan Note (Signed)
Intermittent.  Not currently on metformin. Encouraged to try to figure out if certain foods are exacerbating her symptoms. IF taking magnesium, should decrease the dose.

## 2023-02-25 ENCOUNTER — Ambulatory Visit (INDEPENDENT_AMBULATORY_CARE_PROVIDER_SITE_OTHER): Payer: Medicare Other

## 2023-02-25 VITALS — BP 124/78 | HR 76 | Temp 98.4°F | Ht 64.0 in | Wt 167.0 lb

## 2023-02-25 DIAGNOSIS — H9193 Unspecified hearing loss, bilateral: Secondary | ICD-10-CM | POA: Diagnosis not present

## 2023-02-25 NOTE — Progress Notes (Unsigned)
Patient presents today to have her ears flushed. YL,RMA

## 2023-02-26 ENCOUNTER — Encounter: Payer: Self-pay | Admitting: Internal Medicine

## 2023-03-05 DIAGNOSIS — E113512 Type 2 diabetes mellitus with proliferative diabetic retinopathy with macular edema, left eye: Secondary | ICD-10-CM | POA: Diagnosis not present

## 2023-03-05 LAB — HM DIABETES EYE EXAM

## 2023-03-20 ENCOUNTER — Encounter: Payer: Self-pay | Admitting: Internal Medicine

## 2023-03-20 ENCOUNTER — Other Ambulatory Visit: Payer: Self-pay | Admitting: Internal Medicine

## 2023-04-09 ENCOUNTER — Encounter: Payer: Self-pay | Admitting: Pharmacist

## 2023-04-09 NOTE — Progress Notes (Signed)
Pharmacy Quality Measure Review  This patient is appearing on report for being at risk of failing the measure for Statin Use in Persons with Diabetes (SUPD) medications this calendar year.   Patient has filled atorvastatin 20 mg 3 times this year, most recently on 03/25/23 and picked up 03/27/23. Statin gap closed for 2024.   Catie Eppie Gibson, PharmD, BCACP, CPP Clinical Pharmacist [facility] [phone]

## 2023-04-18 ENCOUNTER — Other Ambulatory Visit: Payer: Self-pay | Admitting: Internal Medicine

## 2023-04-18 DIAGNOSIS — E113593 Type 2 diabetes mellitus with proliferative diabetic retinopathy without macular edema, bilateral: Secondary | ICD-10-CM

## 2023-05-01 ENCOUNTER — Other Ambulatory Visit: Payer: Self-pay | Admitting: Family Medicine

## 2023-05-01 DIAGNOSIS — M62830 Muscle spasm of back: Secondary | ICD-10-CM

## 2023-05-08 ENCOUNTER — Other Ambulatory Visit: Payer: Self-pay | Admitting: Internal Medicine

## 2023-05-09 ENCOUNTER — Other Ambulatory Visit: Payer: Self-pay | Admitting: Internal Medicine

## 2023-05-09 MED ORDER — GABAPENTIN 300 MG PO CAPS: ORAL_CAPSULE | ORAL | 1 refills | Status: DC

## 2023-05-21 ENCOUNTER — Encounter: Payer: Self-pay | Admitting: Internal Medicine

## 2023-05-21 ENCOUNTER — Ambulatory Visit (INDEPENDENT_AMBULATORY_CARE_PROVIDER_SITE_OTHER): Payer: Medicare Other | Admitting: Internal Medicine

## 2023-05-21 VITALS — BP 132/84 | HR 79 | Temp 98.0°F | Ht 64.0 in | Wt 164.8 lb

## 2023-05-21 DIAGNOSIS — I10 Essential (primary) hypertension: Secondary | ICD-10-CM

## 2023-05-21 DIAGNOSIS — Z794 Long term (current) use of insulin: Secondary | ICD-10-CM | POA: Diagnosis not present

## 2023-05-21 DIAGNOSIS — E113593 Type 2 diabetes mellitus with proliferative diabetic retinopathy without macular edema, bilateral: Secondary | ICD-10-CM | POA: Diagnosis not present

## 2023-05-21 DIAGNOSIS — E7800 Pure hypercholesterolemia, unspecified: Secondary | ICD-10-CM

## 2023-05-21 DIAGNOSIS — Z2821 Immunization not carried out because of patient refusal: Secondary | ICD-10-CM | POA: Diagnosis not present

## 2023-05-21 MED ORDER — ATORVASTATIN CALCIUM 20 MG PO TABS: 20.0000 mg | ORAL_TABLET | Freq: Every day | ORAL | 2 refills | Status: DC

## 2023-05-21 NOTE — Patient Instructions (Signed)

## 2023-05-21 NOTE — Progress Notes (Signed)
I,Victoria T Deloria Lair, CMA,acting as a Neurosurgeon for Gwynneth Aliment, MD.,have documented all relevant documentation on the behalf of Gwynneth Aliment, MD,as directed by  Gwynneth Aliment, MD while in the presence of Gwynneth Aliment, MD.  Subjective:  Patient ID: Sharon Mcdonald , female    DOB: 06-08-1956 , 67 y.o.   MRN: [id number]  Chief Complaint  Patient presents with   Diabetes   Hypertension    HPI  She presents today for DM and HTN f/u. She is now taking her meds as prescribed. She is now followed by Endocrinology.  She denies headaches, chest pain and shortness of breath. She has no specific questions or concerns. She states she has noticed an improvement in her blood sugars.      Diabetes She presents for her follow-up diabetic visit. She has type 2 diabetes mellitus. Her disease course has been stable. There are no hypoglycemic associated symptoms. Pertinent negatives for diabetes include no blurred vision and no chest pain. There are no hypoglycemic complications. Diabetic complications include nephropathy. Risk factors for coronary artery disease include diabetes mellitus, dyslipidemia, hypertension and post-menopausal. She is following a diabetic diet. She participates in exercise three times a week. Her breakfast blood glucose is taken between 7-8 am. Her breakfast blood glucose range is generally 90-110 mg/dl. An ACE inhibitor/angiotensin II receptor blocker is being taken. Eye exam is current.  Hypertension This is a chronic problem. The current episode started more than 1 year ago. The problem is unchanged. The problem is controlled. Pertinent negatives include no blurred vision, chest pain, palpitations or shortness of breath.     Past Medical History:  Diagnosis Date   Diabetes mellitus    Diabetic retinopathy (HCC)    High cholesterol    Hypertension      Family History  Problem Relation Age of Onset   Diabetes Mother    Hypertension Mother    Obesity Mother     Diabetes Brother      Current Outpatient Medications:    BD PEN NEEDLE NANO 2ND GEN 32G X 4 MM MISC, USE DAILY IN THE AFTERNOON, Disp: 100 each, Rfl: 3   Continuous Glucose Receiver (DEXCOM G7 RECEIVER) DEVI, USE AS DIRECTED., Disp: 1 each, Rfl: 1   Continuous Glucose Sensor (DEXCOM G7 SENSOR) MISC, USE AS DIRECTED., Disp: 3 each, Rfl: 1   dorzolamide-timolol (COSOPT) 22.3-6.8 MG/ML ophthalmic solution, 1 drop 2 (two) times daily., Disp: , Rfl:    gabapentin (NEURONTIN) 300 MG capsule, TAKE 1 CAPSULE BY MOUTH EVERY DAY, Disp: 90 capsule, Rfl: 1   glipiZIDE (GLUCOTROL) 5 MG tablet, Take 1 tablet (5 mg total) by mouth daily., Disp: 90 tablet, Rfl: 1   insulin degludec (TRESIBA FLEXTOUCH) 100 UNIT/ML FlexTouch Pen, Inject 30 Units into the skin daily. (Patient taking differently: Inject 33 Units into the skin daily.), Disp: 30 mL, Rfl: 3   lisinopril (ZESTRIL) 40 MG tablet, TAKE 1 TABLET BY MOUTH EVERY DAY, Disp: 90 tablet, Rfl: 3   meloxicam (MOBIC) 7.5 MG tablet, Take 1 tablet (7.5 mg total) by mouth daily., Disp: 30 tablet, Rfl: 3   Menthol, Topical Analgesic, (BIOFREEZE COOL THE PAIN) 4 % GEL, Apply 1 inch topically 2 (two) times daily as needed., Disp: 3 mL, Rfl: 2   methocarbamol (ROBAXIN) 500 MG tablet, TAKE 1 TABLET (500 MG TOTAL) BY MOUTH 2 (TWO) TIMES DAILY AS NEEDED FOR MUSCLE SPASMS., Disp: 60 tablet, Rfl: 0   MOUNJARO 7.5 MG/0.5ML Pen, INJECT 7.5 MG  into the skin ONCE A WEEK, Disp: 2 mL, Rfl: 1   atorvastatin (LIPITOR) 20 MG tablet, Take 1 tablet (20 mg total) by mouth daily., Disp: 90 tablet, Rfl: 2   No Known Allergies   Review of Systems  Constitutional: Negative.   Eyes:  Negative for blurred vision.  Respiratory: Negative.  Negative for shortness of breath.   Cardiovascular: Negative.  Negative for chest pain and palpitations.  Gastrointestinal: Negative.   Neurological: Negative.   Psychiatric/Behavioral: Negative.       Today's Vitals   05/21/23 1458  BP: 132/84   Pulse: 79  Temp: 98 F (36.7 C)  SpO2: 98%  Weight: 164 lb 12.8 oz (74.8 kg)  Height: 5\' 4"  (1.626 m)   Body mass index is 28.29 kg/m.  Wt Readings from Last 3 Encounters:  05/21/23 164 lb 12.8 oz (74.8 kg)  02/26/23 167 lb (75.8 kg)  02/17/23 167 lb 6.4 oz (75.9 kg)     Objective:  Physical Exam Vitals and nursing note reviewed.  Constitutional:      Appearance: Normal appearance.  HENT:     Head: Normocephalic and atraumatic.  Eyes:     Extraocular Movements: Extraocular movements intact.  Cardiovascular:     Rate and Rhythm: Normal rate and regular rhythm.     Pulses:          Dorsalis pedis pulses are 2+ on the right side and 2+ on the left side.     Heart sounds: Normal heart sounds.  Pulmonary:     Effort: Pulmonary effort is normal.     Breath sounds: Normal breath sounds.  Musculoskeletal:     Cervical back: Normal range of motion.       Feet:  Feet:     Right foot:     Protective Sensation: 5 sites tested.  5 sites sensed.     Skin integrity: Dry skin present.     Toenail Condition: Right toenails are normal.     Left foot:     Protective Sensation: 5 sites tested.  5 sites sensed.     Skin integrity: Dry skin present.     Toenail Condition: Left toenails are normal.  Skin:    General: Skin is warm.  Neurological:     General: No focal deficit present.     Mental Status: She is alert.  Psychiatric:        Mood and Affect: Mood normal.        Behavior: Behavior normal.         Assessment And Plan:  Type 2 diabetes mellitus with proliferative retinopathy of both eyes, with long-term current use of insulin, macular edema presence unspecified, unspecified proliferative retinopathy type [facility]) Assessment & Plan: Chronic, as stated earlier she has recently been followed by Endocrinology.  She reports improved compliance with Tresiba 33 units sq nightly, glipizide 5mg  daily and Mounjaro 7.5mg  weekly. Again, importance of medication and dietary compliance  was stressed to the patient.   She will f/u in three to four months.    Orders: -     Hemoglobin A1c -     BMP8+eGFR  Benign hypertension Assessment & Plan: Chronic, fair control. She will continue with lisinopril 40mg  daily.  She is encouraged to follow low sodium diet.      Pure hypercholesterolemia Assessment & Plan: Chronic, LDL goal is less than 70.  Will send refill of atorvastatin. Encouraged to follow a heart healthy lifestyle.    Immunization declined  Other  orders -     Atorvastatin Calcium; Take 1 tablet (20 mg total) by mouth daily.  Dispense: 90 tablet; Refill: 2     Return in 3 months (on 08/21/2023), or dm check.  Patient was given opportunity to ask questions. Patient verbalized understanding of the plan and was able to repeat key elements of the plan. All questions were answered to their satisfaction.    I, Gwynneth Aliment, MD, have reviewed all documentation for this visit. The documentation on 05/21/23 for the exam, diagnosis, procedures, and orders are all accurate and complete.   IF YOU HAVE BEEN REFERRED TO A SPECIALIST, IT MAY TAKE 1-2 WEEKS TO SCHEDULE/PROCESS THE REFERRAL. IF YOU HAVE NOT HEARD FROM US/SPECIALIST IN TWO WEEKS, PLEASE GIVE [place] A CALL AT [phone].   THE PATIENT IS ENCOURAGED TO PRACTICE SOCIAL DISTANCING DUE TO THE COVID-19 PANDEMIC.

## 2023-05-22 ENCOUNTER — Other Ambulatory Visit: Payer: Self-pay | Admitting: Internal Medicine

## 2023-05-22 DIAGNOSIS — I10 Essential (primary) hypertension: Secondary | ICD-10-CM

## 2023-05-22 DIAGNOSIS — E7800 Pure hypercholesterolemia, unspecified: Secondary | ICD-10-CM

## 2023-05-22 DIAGNOSIS — E113593 Type 2 diabetes mellitus with proliferative diabetic retinopathy without macular edema, bilateral: Secondary | ICD-10-CM

## 2023-05-22 LAB — BMP8+EGFR
BUN/Creatinine Ratio: 14 (ref 12–28)
BUN: 11 mg/dL (ref 8–27)
CO2: 22 mmol/L (ref 20–29)
Calcium: 9.5 mg/dL (ref 8.7–10.3)
Chloride: 102 mmol/L (ref 96–106)
Creatinine, Ser: 0.77 mg/dL (ref 0.57–1.00)
Glucose: 241 mg/dL — ABNORMAL HIGH (ref 70–99)
Potassium: 4.2 mmol/L (ref 3.5–5.2)
Sodium: 140 mmol/L (ref 134–144)
eGFR: 84 mL/min/{1.73_m2} (ref >59)

## 2023-05-22 LAB — HEMOGLOBIN A1C
Est. average glucose Bld gHb Est-mCnc: 260 mg/dL
Hgb A1c MFr Bld: 10.7 % — ABNORMAL HIGH (ref 4.8–5.6)

## 2023-05-23 ENCOUNTER — Encounter: Payer: Self-pay | Admitting: Internal Medicine

## 2023-05-28 ENCOUNTER — Telehealth: Payer: Self-pay

## 2023-05-28 NOTE — Progress Notes (Signed)
   Care Guide Note  05/28/2023 Name: Sharon Mcdonald MRN: [id number] DOB: Oct 04, 1955  Referred by: Dorothyann Peng, MD Reason for referral : Care Coordination (Outreach to schedule with Pharm d )   Sharon Mcdonald is a 67 y.o. year old female who is a primary care patient of Dorothyann Peng, MD. Jearld Pies was referred to the pharmacist for assistance related to DM.    Successful contact was made with the patient to discuss pharmacy services including being ready for the pharmacist to call at least 5 minutes before the scheduled appointment time, to have medication bottles and any blood sugar or blood pressure readings ready for review. The patient agreed to meet with the pharmacist via with the pharmacist via telephone visit on (date/time).  06/30/2023  Penne Lash , RMA     [place]  [facility], [place] Day Guide  Direct Dial: [phone]  Website: [email or web address]

## 2023-05-31 DIAGNOSIS — E7800 Pure hypercholesterolemia, unspecified: Secondary | ICD-10-CM | POA: Insufficient documentation

## 2023-05-31 DIAGNOSIS — Z2821 Immunization not carried out because of patient refusal: Secondary | ICD-10-CM | POA: Insufficient documentation

## 2023-05-31 NOTE — Assessment & Plan Note (Signed)
Chronic, LDL goal is less than 70.  Will send refill of atorvastatin. Encouraged to follow a heart healthy lifestyle.

## 2023-05-31 NOTE — Assessment & Plan Note (Signed)
Chronic, as stated earlier she has recently been followed by Endocrinology.  She reports improved compliance with Tresiba 33 units sq nightly, glipizide 5mg  daily and Mounjaro 7.5mg  weekly. Again, importance of medication and dietary compliance was stressed to the patient.   She will f/u in three to four months.

## 2023-05-31 NOTE — Assessment & Plan Note (Signed)
Chronic, fair control. She will continue with lisinopril 40mg  daily.  She is encouraged to follow low sodium diet.

## 2023-06-03 ENCOUNTER — Other Ambulatory Visit: Payer: Self-pay | Admitting: Internal Medicine

## 2023-06-03 MED ORDER — AZITHROMYCIN 500 MG PO TABS: ORAL_TABLET | ORAL | 0 refills | Status: DC

## 2023-06-16 DIAGNOSIS — H3582 Retinal ischemia: Secondary | ICD-10-CM | POA: Diagnosis not present

## 2023-06-16 DIAGNOSIS — E113513 Type 2 diabetes mellitus with proliferative diabetic retinopathy with macular edema, bilateral: Secondary | ICD-10-CM | POA: Diagnosis not present

## 2023-06-16 DIAGNOSIS — H43813 Vitreous degeneration, bilateral: Secondary | ICD-10-CM | POA: Diagnosis not present

## 2023-06-17 ENCOUNTER — Other Ambulatory Visit: Payer: Self-pay | Admitting: Internal Medicine

## 2023-06-17 DIAGNOSIS — E113593 Type 2 diabetes mellitus with proliferative diabetic retinopathy without macular edema, bilateral: Secondary | ICD-10-CM

## 2023-06-18 ENCOUNTER — Other Ambulatory Visit: Payer: Self-pay

## 2023-06-18 DIAGNOSIS — Z794 Long term (current) use of insulin: Secondary | ICD-10-CM

## 2023-06-18 MED ORDER — FREESTYLE LIBRE 3 PLUS SENSOR MISC: 3 refills | Status: DC

## 2023-06-21 ENCOUNTER — Other Ambulatory Visit: Payer: Self-pay | Admitting: Internal Medicine

## 2023-06-30 ENCOUNTER — Other Ambulatory Visit: Payer: Medicare Other | Admitting: Pharmacist

## 2023-06-30 DIAGNOSIS — Z794 Long term (current) use of insulin: Secondary | ICD-10-CM

## 2023-06-30 DIAGNOSIS — E113593 Type 2 diabetes mellitus with proliferative diabetic retinopathy without macular edema, bilateral: Secondary | ICD-10-CM

## 2023-06-30 MED ORDER — TIRZEPATIDE 10 MG/0.5ML ~~LOC~~ SOAJ: 10.0000 mg | SUBCUTANEOUS | 2 refills | Status: DC

## 2023-06-30 MED ORDER — TRESIBA FLEXTOUCH 100 UNIT/ML ~~LOC~~ SOPN: 20.0000 [IU] | PEN_INJECTOR | Freq: Every day | SUBCUTANEOUS | 2 refills | Status: DC

## 2023-06-30 NOTE — Patient Instructions (Signed)
Sharon Mcdonald,   It was great talking to you today! Keep up the FANTASTIC work!  Increase Mounjaro to 10 mg weekly. Decrease Tresiba to 20 units daily. STOP glipizide.   Please reach out if you continue to have low blood sugars (less than 70) even with these changes.   Take care,   Catie Eppie Gibson, PharmD, BCACP, CPP Clinical Pharmacist [facility] [phone]

## 2023-06-30 NOTE — Progress Notes (Unsigned)
06/30/2023 Name: Sharon Mcdonald MRN: [id number] DOB: 1955-10-16  Chief Complaint  Patient presents with   Medication Management   Diabetes    Sharon Mcdonald is a 67 y.o. year old female who presented for a telephone visit.   They were referred to the pharmacist by their PCP for assistance in managing diabetes.    Subjective:  Care Team: Primary Care Provider: Dorothyann Peng, MD ; Next Scheduled Visit: 09/01/23  Medication Access/Adherence  [facility]  [phone] [place] - [address] [place] [address] Phone: [phone] Fax: [phone]  [facility] - [place], [place] - [address] [place] [address] Phone: [phone] Fax: [phone]   Patient reports affordability concerns with their medications: No  Patient reports access/transportation concerns to their pharmacy: Yes  Patient reports adherence concerns with their medications:  Yes  Reports she does not forget to take her medications, but sometimes loses access.    Diabetes:  Current medications: Mounjaro 10 mg weekly, Tresiba 32 units daily, glipizide 5 mg daily before breakfast  Medications tried in the past: metformin IR & XR - diarrhea/stomach upset; Ozempic - unclear, maybe stomach upset; Jardiance - yeast infection  Date of Download: 12/10-12/23/24 % Time CGM is active: 90% Average Glucose: 121 mg/dL Glucose Management Indicator: 6.2% Glucose Variability: 37.2 (goal <36%) Time in Goal:  - Time in range 70-180: 79% - Time above range: 10% - Time below range: 11% Observed patterns: fasting hypoglycemia     Needs PA done for [facility]   Patient reports hypoglycemic s/sx including dizziness, shakiness, sweating.   Current meal patterns:  - Breakfast: skips breakfast - Lunch: salad - lettuce, onions, broccoli, carrots, sometimes deli meat ([place], salami); dips salad in ranch;  - Supper: meat  and 2 vegetables; minimizes carbohydrates; salmon, spinach, brussels sprouts - wild grain brown rice  - Snacks/desserts- chips; fruit - Drinks: water  Current physical activity: was walking 3 miles daily x 3 days weekly, but has not since it has gotten cold - is going to go to the [facility]  Current medication access support: no cost concerns at this time   Hyperlipidemia/ASCVD Risk Reduction  Current lipid lowering medications: atorvastatin 20 mg daily - but reports she may not have been adherent at the time of last lipid panel  Clinical ASCVD: No  The 10-year ASCVD risk score (Arnett DK, et al., 2019) is: 20.9%   Values used to calculate the score:     Age: 44 years     Sex: Female     Is Non-Hispanic African American: Yes     Diabetic: Yes     Tobacco smoker: No     Systolic Blood Pressure: 132 mmHg     Is BP treated: Yes     HDL Cholesterol: 50 mg/dL     Total Cholesterol: 166 mg/dL   Objective:  Lab Results  Component Value Date   HGBA1C 10.7 (H) 05/21/2023    Lab Results  Component Value Date   CREATININE 0.77 05/21/2023   BUN 11 05/21/2023   NA 140 05/21/2023   K 4.2 05/21/2023   CL 102 05/21/2023   CO2 22 05/21/2023    Lab Results  Component Value Date   CHOL 166 02/17/2023   HDL 50 02/17/2023   LDLCALC 101 (H) 02/17/2023   TRIG 79 02/17/2023   CHOLHDL 3.3 02/17/2023    Medications Reviewed Today     Reviewed  by Alden Hipp, RPH-CPP (Pharmacist) on 06/30/23 at 1009  Med List Status: <None>   Medication Order Taking? Sig Documenting Provider Last Dose Status Informant  atorvastatin (LIPITOR) 20 MG tablet [id number] Yes Take 1 tablet (20 mg total) by mouth daily. Dorothyann Peng, MD Taking Active   BD PEN NEEDLE NANO 2ND GEN 32G X 4 MM MISC [id number] Yes USE DAILY IN THE AFTERNOON Shamleffer, Konrad Dolores, MD Taking Active   BIOTIN PO [id number] Yes Take by mouth. [provider] Taking Active   Continuous Glucose Sensor (FREESTYLE LIBRE 3  PLUS SENSOR) MISC [id number] Yes USE AS DIRECTED TO CHECK BLOOD SUGARS. Dorothyann Peng, MD Taking Active   dorzolamide-timolol (COSOPT) 22.3-6.8 MG/ML ophthalmic solution [id number] No 1 drop 2 (two) times daily.  Patient not taking: Reported on 06/30/2023   [provider] Not Taking Active   gabapentin (NEURONTIN) 300 MG capsule [id number] Yes TAKE 1 CAPSULE BY MOUTH EVERY DAY Dorothyann Peng, MD Taking Active   glipiZIDE (GLUCOTROL) 5 MG tablet [id number] Yes Take 1 tablet (5 mg total) by mouth daily. Shamleffer, Konrad Dolores, MD Taking Active   insulin degludec (TRESIBA FLEXTOUCH) 100 UNIT/ML FlexTouch Pen [id number] Yes Inject 30 Units into the skin daily.  Patient taking differently: Inject 33 Units into the skin daily.   Shamleffer, Konrad Dolores, MD Taking Active   lisinopril (ZESTRIL) 40 MG tablet [id number] Yes TAKE 1 TABLET BY MOUTH EVERY DAY Shamleffer, Konrad Dolores, MD Taking Active   methocarbamol (ROBAXIN) 500 MG tablet [id number] No TAKE 1 TABLET (500 MG TOTAL) BY MOUTH 2 (TWO) TIMES DAILY AS NEEDED FOR MUSCLE SPASMS.  Patient not taking: Reported on 06/30/2023   Ellender Hose, NP Not Taking Active   MOUNJARO 7.5 MG/0.5ML Pen [id number] Yes Inject 1 pen into the skin every week Dorothyann Peng, MD Taking Active   Multiple Vitamin (MULTIVITAMIN) tablet [id number] Yes Take 1 tablet by mouth daily. [provider] Taking Active   Omega-3 Fatty Acids (FISH OIL PO) [id number] Yes Take by mouth. [provider] Taking Active               Assessment/Plan:   Diabetes: - Currently improved per CGM readings. GMI at goal <7%, but significant hypoglycemic burden.  - Reviewed goal A1c, goal fasting, and goal 2 hour post prandial glucose - Reviewed dietary modifications including: praised for focus on lean proteins, fruits and vegetables, whole grains - Reviewed lifestyle modifications including: praise for exercise plan - Recommend to increase Mounjaro to 10  mg weekly, decrease Tresiba to 20 units daily and stop glipizide due to hypoglycemia. Patient will contact me if development of hypoglycemia even with these changes. - Recommend to check glucose using Libre. Will follow up on Prior Auth needs. [facility] - [facility] Josephine Igo on [employer], but cost was >$100, likely that this plan prefers DexCom. Completed PA on [facility].   Hyperlipidemia/ASCVD Risk Reduction: - Currently uncontrolled.  - Recommend to continue current regimen, focus on adherence. Goal LDL <70.    Follow Up Plan: phone call in 4 weeks  Catie TClearance Coots, PharmD, BCACP, CPP Clinical Pharmacist [facility] [phone]

## 2023-07-08 ENCOUNTER — Other Ambulatory Visit: Payer: Self-pay | Admitting: Family Medicine

## 2023-07-08 DIAGNOSIS — M62830 Muscle spasm of back: Secondary | ICD-10-CM

## 2023-07-18 ENCOUNTER — Encounter: Payer: Self-pay | Admitting: Pharmacist

## 2023-07-28 ENCOUNTER — Other Ambulatory Visit: Payer: Self-pay | Admitting: Pharmacist

## 2023-07-28 ENCOUNTER — Other Ambulatory Visit: Payer: Self-pay | Admitting: Internal Medicine

## 2023-07-28 DIAGNOSIS — E1165 Type 2 diabetes mellitus with hyperglycemia: Secondary | ICD-10-CM

## 2023-07-28 DIAGNOSIS — Z794 Long term (current) use of insulin: Secondary | ICD-10-CM

## 2023-07-28 NOTE — Patient Instructions (Signed)
Sharon Mcdonald,   It was great talking to you! Keep up the fantastic work. Let us know if you have any issues when you increase the Mounjaro dose.   Please reach out with any future issues getting your medications or Libre sensors.   Thanks!  Catie Clearance Coots, PharmD

## 2023-07-28 NOTE — Progress Notes (Signed)
   07/28/2023 Name: Sharon Mcdonald MRN: [id number] DOB: 1956/04/06  Chief Complaint  Patient presents with   Medication Management   Diabetes    Sharon Mcdonald is a 68 y.o. year old female who presented for a telephone visit.   They were referred to the pharmacist by their PCP for assistance in managing diabetes.    Subjective:  Care Team: Primary Care Provider: Dorothyann Peng, MD ; Next Scheduled Visit: 09/01/23  Medication Access/Adherence  [facility]acy[facility]  [phone] [place] - [address] [place] [address] Phone: [phone] Fax: [phone]  [facility] - [place], [place] - [address] [place] [address] Phone: [phone] Fax: [phone]   Patient reports affordability concerns with their medications: No  Patient reports access/transportation concerns to their pharmacy: No  Patient reports adherence concerns with their medications:  No     Diabetes:  Current medications: Mounjaro 7.5 mg weekly (starts 10 mg dose this weekent); Tresiba 20 units daily,  Medications tried in the past: metformin IR & XR - diarrhea/stomach upset; Ozempic - unclear, maybe stomach upset; Jardiance - yeast infection;   Date of Download: 12/24-07/14/23 % Time CGM is active: 93% Average Glucose: 153 mg/dL Glucose Management Indicator: 7.0 Glucose Variability: 26.5 (goal <36%) Time in Goal:  - Time in range 70-180: 81% - Time above range: 19% - Time below range: 0%  Patient denies hypoglycemic s/sx including dizziness, shakiness, sweating.   Objective:  Lab Results  Component Value Date   HGBA1C 10.7 (H) 05/21/2023    Lab Results  Component Value Date   CREATININE 0.77 05/21/2023   BUN 11 05/21/2023   NA 140 05/21/2023   K 4.2 05/21/2023   CL 102 05/21/2023   CO2 22 05/21/2023    Lab Results  Component Value Date   CHOL 166 02/17/2023   HDL 50 02/17/2023   LDLCALC  101 (H) 02/17/2023   TRIG 79 02/17/2023   CHOLHDL 3.3 02/17/2023    Assessment/Plan:   Diabetes: - Currently controlled per CGM readings.  - Recommend to increase to Mounjaro 10 mg daily this weekend as previously prescribed. Continue Tresiba 20 units daily for now, but patient instructed to contact us if development of hypoglycemia for further insulin reduction.  - Recommend to check glucose using CGM   Follow Up Plan: follow up with PCP as scheduled  Catie Eppie Gibson, PharmD, BCACP, CPP Clinical Pharmacist [facility] [phone]

## 2023-08-19 ENCOUNTER — Telehealth: Payer: Self-pay | Admitting: Pharmacist

## 2023-08-19 ENCOUNTER — Encounter: Payer: Self-pay | Admitting: Pharmacist

## 2023-08-19 DIAGNOSIS — E113593 Type 2 diabetes mellitus with proliferative diabetic retinopathy without macular edema, bilateral: Secondary | ICD-10-CM

## 2023-08-19 DIAGNOSIS — E1122 Type 2 diabetes mellitus with diabetic chronic kidney disease: Secondary | ICD-10-CM

## 2023-08-19 NOTE — Progress Notes (Signed)
   08/19/2023 Name: Sharon Mcdonald MRN: [id number] DOB: 12/07/1955  Chief Complaint  Patient presents with   Medication Management    Diabetes     PEGI Mcdonald is a 68 y.o. year old female who presented for a telephone visit.   They were referred to the pharmacist by a quality report  ([employer]) for assistance in managing diabetes.   Following up on diabetes management. Last visit with Sharon Mcdonald 07/28/2023  Subjective:  Care Team: Primary Care Provider: Dorothyann Peng, MD ; Next Scheduled Visit: 09/01/2023  Medication Access/Adherence  [facility]  [phone] Otto [place] - [address] [place] [address] Phone: [phone] Fax: [phone]  [facility] - [place], [place] - [address] [place] [address] Phone: [phone] Fax: [phone]   Patient reports affordability concerns with their medications: No  Patient reports access/transportation concerns to their pharmacy: No  Patient reports adherence concerns with their medications:  No  .   Diabetes:  Current medications:  Mounjaro 10 mg weekly Tresiba Flextouch 100 u  20 units daily Patient denies hypoglycemic or hyperglycemic symptoms.  Patient was not at home at the time of our call.  She wondered about her new Medicare Benefits. We discussed her [facility] Plan. She has a $255 deductible which may come into play with Mounjaro.  She reported her Sharon Mcdonald was $35.00.  She also wondered about an Exercise Program.  After doing some research, her plan includes the Renew Active program which provides a free membership location at a fitness location the patient selects from their network.  The [facility], [address] [employer], [address] [place], [employer], [employer], and [employer] on [address] [place] are all choices the Patient can use to workout.     Objective:  Lab Results  Component Value Date   HGBA1C  10.7 (H) 05/21/2023    Lab Results  Component Value Date   CREATININE 0.77 05/21/2023   BUN 11 05/21/2023   NA 140 05/21/2023   K 4.2 05/21/2023   CL 102 05/21/2023   CO2 22 05/21/2023    Lab Results  Component Value Date   CHOL 166 02/17/2023   HDL 50 02/17/2023   LDLCALC 101 (H) 02/17/2023   TRIG 79 02/17/2023   CHOLHDL 3.3 02/17/2023    Medications Reviewed Today   Medications were not reviewed in this encounter       Assessment/Plan:   Diabetes: - Currently uncontrolled A1c 10.7 in November down from 13 started on Mounjaro in December/ -Since Patient was not at home to provide blood sugars, medications were not titrated. [facility] does not have a Patient Assistance Program.  It is available through the [employer]'s diabetes Fund but [employer] is currently closed. - Sharon Mcdonald could be obtained through [employer] (current copay $35) -Current deductible $255   Follow Up Plan:    Follow up after upcoming PCP appointment for medication titration per new lab values. Send a Wellsite geologist to the patient with contact info on the Gyms in her network.  Beecher Mcardle, PharmD, BCACP Clinical Pharmacist [phone]

## 2023-09-01 ENCOUNTER — Telehealth: Payer: Self-pay | Admitting: Pharmacist

## 2023-09-01 ENCOUNTER — Encounter: Payer: Self-pay | Admitting: Internal Medicine

## 2023-09-01 ENCOUNTER — Ambulatory Visit (INDEPENDENT_AMBULATORY_CARE_PROVIDER_SITE_OTHER): Payer: Medicare Other | Admitting: Internal Medicine

## 2023-09-01 ENCOUNTER — Other Ambulatory Visit: Payer: Self-pay | Admitting: Internal Medicine

## 2023-09-01 ENCOUNTER — Other Ambulatory Visit: Payer: Self-pay

## 2023-09-01 VITALS — BP 124/80 | HR 87 | Temp 98.3°F | Ht 64.0 in | Wt 162.0 lb

## 2023-09-01 DIAGNOSIS — E113593 Type 2 diabetes mellitus with proliferative diabetic retinopathy without macular edema, bilateral: Secondary | ICD-10-CM | POA: Diagnosis not present

## 2023-09-01 DIAGNOSIS — Z794 Long term (current) use of insulin: Secondary | ICD-10-CM | POA: Diagnosis not present

## 2023-09-01 DIAGNOSIS — Z2821 Immunization not carried out because of patient refusal: Secondary | ICD-10-CM | POA: Diagnosis not present

## 2023-09-01 DIAGNOSIS — I10 Essential (primary) hypertension: Secondary | ICD-10-CM | POA: Diagnosis not present

## 2023-09-01 DIAGNOSIS — E7800 Pure hypercholesterolemia, unspecified: Secondary | ICD-10-CM

## 2023-09-01 MED ORDER — MOUNJARO 12.5 MG/0.5ML ~~LOC~~ SOAJ: 12.5000 mg | SUBCUTANEOUS | 1 refills | Status: DC

## 2023-09-01 MED ORDER — GABAPENTIN 300 MG PO CAPS: ORAL_CAPSULE | ORAL | 1 refills | Status: DC

## 2023-09-01 MED ORDER — FREESTYLE LIBRE 3 PLUS SENSOR MISC: 3 refills | Status: DC

## 2023-09-01 MED ORDER — LISINOPRIL 40 MG PO TABS: 40.0000 mg | ORAL_TABLET | Freq: Every day | ORAL | 3 refills | Status: AC

## 2023-09-01 NOTE — Progress Notes (Signed)
   09/01/2023  Patient ID: Sharon Mcdonald, female   DOB: 03-15-1956, 68 y.o.   MRN: [id number]  Patient called to inquire about the fitness program associated with her health plan.  The following information was researched previously:  Renew Active program which provides a free membership location at a fitness location the patient selects from their network.  The [facility], [address] [employer], [address] [place], [employer], [employer], and [employer] on [address] are all choices the Patient can use to workout.    Patient was provided this information and instructed to reach out to her insurance plan for complete details.   Beecher Mcardle, PharmD, BCACP Clinical Pharmacist [phone]

## 2023-09-01 NOTE — Assessment & Plan Note (Signed)
 Chronic, as stated earlier she has recently been followed by Endocrinology.  She reports improved compliance with Tresiba 20 units sq nightly, and Mounjaro 10mg , I will increase her to [facility] 12.5mg  weekly for improved glycemic control. Importance of medication and dietary compliance was stressed to the patient.   She will f/u in three to four months.

## 2023-09-01 NOTE — Patient Instructions (Signed)

## 2023-09-01 NOTE — Progress Notes (Signed)
 I,[name], CMA,acting as a Neurosurgeon for Sharon Aliment, MD.,have documented all relevant documentation on the behalf of Sharon Aliment, MD,as directed by  Sharon Aliment, MD while in the presence of Sharon Aliment, MD.  Subjective:  Patient ID: Sharon Mcdonald , female    DOB: 1956/05/30 , 68 y.o.   MRN: [id number]  Chief Complaint  Patient presents with   Hypertension   Diabetes   Hyperlipidemia    HPI  She presents today for DM and HTN f/u. Patient reports compliance with her meds. She denies headaches, chest pain and shortness of breath. She has no specific questions or concerns.     Hypertension This is a chronic problem. The current episode started more than 1 year ago. The problem is unchanged. The problem is controlled. Pertinent negatives include no blurred vision, chest pain or shortness of breath.  Diabetes She presents for her follow-up diabetic visit. She has type 2 diabetes mellitus. Her disease course has been stable. There are no hypoglycemic associated symptoms. Pertinent negatives for diabetes include no blurred vision, no chest pain, no polydipsia, no polyphagia and no polyuria. There are no hypoglycemic complications. Diabetic complications include nephropathy. Risk factors for coronary artery disease include diabetes mellitus, dyslipidemia, hypertension and post-menopausal. She is following a diabetic diet. She participates in exercise three times a week. Her breakfast blood glucose is taken between 7-8 am. Her breakfast blood glucose range is generally 90-110 mg/dl. An ACE inhibitor/angiotensin II receptor blocker is being taken. Eye exam is current.  Hyperlipidemia This is a chronic problem. The current episode started more than 1 year ago. Exacerbating diseases include diabetes. Pertinent negatives include no chest pain or shortness of breath. Current antihyperlipidemic treatment includes statins. The current treatment provides moderate improvement of  lipids. Risk factors for coronary artery disease include diabetes mellitus, dyslipidemia, post-menopausal and hypertension.     Past Medical History:  Diagnosis Date   Diabetes mellitus    Diabetic retinopathy (HCC)    High cholesterol    Hypertension      Family History  Problem Relation Age of Onset   Diabetes Mother    Hypertension Mother    Obesity Mother    Diabetes Brother      Current Outpatient Medications:    atorvastatin (LIPITOR) 20 MG tablet, Take 1 tablet (20 mg total) by mouth daily., Disp: 90 tablet, Rfl: 2   BD PEN NEEDLE NANO 2ND GEN 32G X 4 MM MISC, USE DAILY IN THE AFTERNOON, Disp: 100 each, Rfl: 3   BIOTIN PO, Take by mouth., Disp: , Rfl:    insulin degludec (TRESIBA FLEXTOUCH) 100 UNIT/ML FlexTouch Pen, Inject 20 Units into the skin daily., Disp: 15 mL, Rfl: 2   Multiple Vitamin (MULTIVITAMIN) tablet, Take 1 tablet by mouth daily., Disp: , Rfl:    Omega-3 Fatty Acids (FISH OIL PO), Take by mouth., Disp: , Rfl:    Continuous Glucose Sensor (FREESTYLE LIBRE 3 PLUS SENSOR) MISC, Change sensor every 15 days., Disp: 1 each, Rfl: 3   gabapentin (NEURONTIN) 300 MG capsule, TAKE 1 CAPSULE BY MOUTH EVERY DAY, Disp: 90 capsule, Rfl: 1   lisinopril (ZESTRIL) 40 MG tablet, Take 1 tablet (40 mg total) by mouth daily., Disp: 90 tablet, Rfl: 3   methocarbamol (ROBAXIN) 500 MG tablet, TAKE 1 TABLET (500 MG TOTAL) BY MOUTH 2 (TWO) TIMES DAILY AS NEEDED FOR MUSCLE SPASMS. (Patient not taking: Reported on 09/01/2023), Disp: 60 tablet, Rfl: 0   tirzepatide (MOUNJARO) 12.5  MG/0.5ML Pen, Inject 12.5 mg into the skin once a week., Disp: 2 mL, Rfl: 1   No Known Allergies   Review of Systems  Constitutional: Negative.   Eyes: Negative.  Negative for blurred vision.  Respiratory: Negative.  Negative for shortness of breath.   Cardiovascular: Negative.  Negative for chest pain.  Gastrointestinal: Negative.   Endocrine: Negative for polydipsia, polyphagia and polyuria.   Musculoskeletal: Negative.   Skin: Negative.   Neurological: Negative.   Psychiatric/Behavioral: Negative.       Today's Vitals   09/01/23 1520  BP: 124/80  Pulse: 87  Temp: 98.3 F (36.8 C)  TempSrc: Oral  Weight: 162 lb (73.5 kg)  Height: 5\' 4"  (1.626 m)  PainSc: 0-No pain   Body mass index is 27.81 kg/m.  Wt Readings from Last 3 Encounters:  09/01/23 162 lb (73.5 kg)  05/21/23 164 lb 12.8 oz (74.8 kg)  02/26/23 167 lb (75.8 kg)    The 10-year ASCVD risk score (Arnett DK, et al., 2019) is: 19.4%   Values used to calculate the score:     Age: 31 years     Sex: Female     Is Non-Hispanic African American: Yes     Diabetic: Yes     Tobacco smoker: No     Systolic Blood Pressure: 124 mmHg     Is BP treated: Yes     HDL Cholesterol: 50 mg/dL     Total Cholesterol: 166 mg/dL  Objective:  Physical Exam Vitals and nursing note reviewed.  Constitutional:      Appearance: Normal appearance.  HENT:     Head: Normocephalic and atraumatic.  Eyes:     Extraocular Movements: Extraocular movements intact.  Cardiovascular:     Rate and Rhythm: Normal rate and regular rhythm.     Heart sounds: Normal heart sounds.  Pulmonary:     Effort: Pulmonary effort is normal.     Breath sounds: Normal breath sounds.  Musculoskeletal:     Cervical back: Normal range of motion.  Skin:    General: Skin is warm.  Neurological:     General: No focal deficit present.     Mental Status: She is alert.  Psychiatric:        Mood and Affect: Mood normal.        Behavior: Behavior normal.         Assessment And Plan:  Benign hypertension Assessment & Plan: Chronic, fair control. She will continue with lisinopril 40mg  daily.  She is encouraged to follow low sodium diet.      Type 2 diabetes mellitus with proliferative retinopathy of both eyes, with long-term current use of insulin, macular edema presence unspecified, unspecified proliferative retinopathy type [facility]) Assessment &  Plan: Chronic, as stated earlier she has recently been followed by Endocrinology.  She reports improved compliance with Tresiba 20 units sq nightly, and Mounjaro 10mg , I will increase her to [facility] 12.5mg  weekly for improved glycemic control. Importance of medication and dietary compliance was stressed to the patient.   She will f/u in three to four months.    Orders: -     CMP14+EGFR -     Hemoglobin A1c  Pure hypercholesterolemia Assessment & Plan: Chronic, LDL goal is less than 70.  She will continue with atorvastatin 80mg  daily.  She is encouraged to follow a heart healthy lifestyle.    COVID-19 vaccination declined  Pneumococcal vaccination declined  Herpes zoster vaccination declined  Other orders -  Gabapentin; TAKE 1 CAPSULE BY MOUTH EVERY DAY  Dispense: 90 capsule; Refill: 1 -     Lisinopril; Take 1 tablet (40 mg total) by mouth daily.  Dispense: 90 tablet; Refill: 3 -     Mounjaro; Inject 12.5 mg into the skin once a week.  Dispense: 2 mL; Refill: 1    Return in 3 months (on 11/29/2023), or dm check.  Patient was given opportunity to ask questions. Patient verbalized understanding of the plan and was able to repeat key elements of the plan. All questions were answered to their satisfaction.    I, Sharon Aliment, MD, have reviewed all documentation for this visit. The documentation on 09/01/23 for the exam, diagnosis, procedures, and orders are all accurate and complete.   IF YOU HAVE BEEN REFERRED TO A SPECIALIST, IT MAY TAKE 1-2 WEEKS TO SCHEDULE/PROCESS THE REFERRAL. IF YOU HAVE NOT HEARD FROM US/SPECIALIST IN TWO WEEKS, PLEASE GIVE [place] A CALL AT [phone] X [phone].

## 2023-09-01 NOTE — Assessment & Plan Note (Signed)
 Chronic, fair control. She will continue with lisinopril 40mg  daily.  She is encouraged to follow low sodium diet.

## 2023-09-01 NOTE — Assessment & Plan Note (Signed)
 Chronic, LDL goal is less than 70.  She will continue with atorvastatin 80mg  daily.  She is encouraged to follow a heart healthy lifestyle.

## 2023-09-02 LAB — CMP14+EGFR
ALT: 20 [IU]/L (ref 0–32)
AST: 24 [IU]/L (ref 0–40)
Albumin: 4.1 g/dL (ref 3.9–4.9)
Alkaline Phosphatase: 90 [IU]/L (ref 44–121)
BUN/Creatinine Ratio: 16 (ref 12–28)
BUN: 12 mg/dL (ref 8–27)
Bilirubin Total: 0.4 mg/dL (ref 0.0–1.2)
CO2: 23 mmol/L (ref 20–29)
Calcium: 9.5 mg/dL (ref 8.7–10.3)
Chloride: 105 mmol/L (ref 96–106)
Creatinine, Ser: 0.73 mg/dL (ref 0.57–1.00)
Globulin, Total: 2.6 g/dL (ref 1.5–4.5)
Glucose: 176 mg/dL — ABNORMAL HIGH (ref 70–99)
Potassium: 4.6 mmol/L (ref 3.5–5.2)
Sodium: 144 mmol/L (ref 134–144)
Total Protein: 6.7 g/dL (ref 6.0–8.5)
eGFR: 90 mL/min/{1.73_m2} (ref >59)

## 2023-09-02 LAB — HEMOGLOBIN A1C
Est. average glucose Bld gHb Est-mCnc: 197 mg/dL
Hgb A1c MFr Bld: 8.5 % — ABNORMAL HIGH (ref 4.8–5.6)

## 2023-09-15 NOTE — Progress Notes (Signed)
 09/15/2023 Name: Sharon Mcdonald MRN: [id number] DOB: 1955/10/27  Chief Complaint  Patient presents with   Medication Management    Diabetes     Sharon Mcdonald is a 68 y.o. year old female who presented for a telephone visit.   They were referred to the pharmacist by their PCP for assistance in managing diabetes. Purpose of today's call was to follow up after her PCP appt.   Subjective:  Care Team: Primary Care Provider: Dorothyann Peng, MD ; Next Scheduled Visit: 12/04/2023  Medication Access/Adherence  Current Pharmacy:  [facility] [phone] Otto  - [address] [place] [address] Phone: [phone] Fax: [phone]  [facility] - [place], [place] - [address] [place] [address] Phone: [phone] Fax: [phone]   Patient reports affordability concerns with their medications: No  Patient reports access/transportation concerns to their pharmacy: No  Patient reports adherence concerns with their medications:  No      Diabetes:  Current medications:  Mounjaro 12.5 mg weekly Tresiba 20 units daily   Patient denies hypoglycemic s/sx including  dizziness, shakiness, sweating. Patient denies hyperglycemic symptoms including  polyuria, polydipsia, polyphagia, nocturia, neuropathy, blurred vision.    Objective:  Lab Results  Component Value Date   HGBA1C 8.5 (H) 09/01/2023    Lab Results  Component Value Date   CREATININE 0.73 09/01/2023   BUN 12 09/01/2023   NA 144 09/01/2023   K 4.6 09/01/2023   CL 105 09/01/2023   CO2 23 09/01/2023    Lab Results  Component Value Date   CHOL 166 02/17/2023   HDL 50 02/17/2023   LDLCALC 101 (H) 02/17/2023   TRIG 79 02/17/2023   CHOLHDL 3.3 02/17/2023    Medications Reviewed Today     Reviewed by Beecher Mcardle, RPH (Pharmacist) on 09/15/23 at 1528  Med List Status: <None>   Medication Order Taking? Sig Documenting  Provider Last Dose Status Informant  atorvastatin (LIPITOR) 20 MG tablet [id number] Yes Take 1 tablet (20 mg total) by mouth daily. Dorothyann Peng, MD Taking Active   BD PEN NEEDLE NANO 2ND GEN 32G X 4 MM MISC [id number] Yes USE DAILY IN THE AFTERNOON Shamleffer, Konrad Dolores, MD Taking Active   BIOTIN PO [id number] Yes Take by mouth. [provider] Taking Active   Continuous Glucose Sensor (FREESTYLE LIBRE 3 PLUS SENSOR) MISC [id number] Yes CHANGE SENSOR EVERY 15 DAYS. Dorothyann Peng, MD Taking Active   gabapentin (NEURONTIN) 300 MG capsule [id number] Yes TAKE 1 CAPSULE BY MOUTH EVERY DAY Dorothyann Peng, MD Taking Active   insulin degludec (TRESIBA FLEXTOUCH) 100 UNIT/ML FlexTouch Pen [id number] Yes Inject 20 Units into the skin daily. Dorothyann Peng, MD Taking Active   lisinopril (ZESTRIL) 40 MG tablet [id number] Yes Take 1 tablet (40 mg total) by mouth daily. Dorothyann Peng, MD Taking Active   methocarbamol (ROBAXIN) 500 MG tablet [id number] Yes TAKE 1 TABLET (500 MG TOTAL) BY MOUTH 2 (TWO) TIMES DAILY AS NEEDED FOR MUSCLE SPASMS. Ellender Hose, NP Taking Active   Multiple Vitamin (MULTIVITAMIN) tablet [id number] Yes Take 1 tablet by mouth daily. [provider] Taking Active   Omega-3 Fatty Acids (FISH OIL PO) [id number] Yes Take by mouth. [provider] Taking Active   tirzepatide [facility]) 12.5 MG/0.5ML Pen [id number] Yes Inject 12.5 mg into the skin once a week. Dorothyann Peng, MD Taking Active  Assessment/Plan:   Diabetes: - Currently uncontrolled A1c 8.5% down from >10 - Reviewed long term cardiovascular and renal outcomes of uncontrolled blood sugar - Reviewed goal A1c, goal fasting, and goal 2 hour post prandial glucose - Reviewed lifestyle modifications including: joining a gym that is covered by her insurance.  I sent her a link and some resources from [facility] previously. - Recommend to accept the invitation I sent her to join the LibreView for  the [employer].   Follow Up Plan:  Follow up with patient in 1 week to see if she was able to get signed on the LibreView App.     Beecher Mcardle, PharmD, BCACP Clinical Pharmacist [phone]

## 2023-09-18 ENCOUNTER — Other Ambulatory Visit: Payer: Self-pay | Admitting: Internal Medicine

## 2023-09-22 ENCOUNTER — Telehealth: Payer: Self-pay | Admitting: Pharmacist

## 2023-09-22 ENCOUNTER — Other Ambulatory Visit: Payer: Self-pay | Admitting: Internal Medicine

## 2023-09-22 DIAGNOSIS — Z961 Presence of intraocular lens: Secondary | ICD-10-CM | POA: Diagnosis not present

## 2023-09-22 DIAGNOSIS — E1165 Type 2 diabetes mellitus with hyperglycemia: Secondary | ICD-10-CM

## 2023-09-22 DIAGNOSIS — H0288A Meibomian gland dysfunction right eye, upper and lower eyelids: Secondary | ICD-10-CM | POA: Diagnosis not present

## 2023-09-22 DIAGNOSIS — E113553 Type 2 diabetes mellitus with stable proliferative diabetic retinopathy, bilateral: Secondary | ICD-10-CM | POA: Diagnosis not present

## 2023-09-22 DIAGNOSIS — Z1231 Encounter for screening mammogram for malignant neoplasm of breast: Secondary | ICD-10-CM

## 2023-09-22 DIAGNOSIS — Z794 Long term (current) use of insulin: Secondary | ICD-10-CM | POA: Diagnosis not present

## 2023-09-22 DIAGNOSIS — H401131 Primary open-angle glaucoma, bilateral, mild stage: Secondary | ICD-10-CM | POA: Diagnosis not present

## 2023-09-22 DIAGNOSIS — H26493 Other secondary cataract, bilateral: Secondary | ICD-10-CM | POA: Diagnosis not present

## 2023-09-22 DIAGNOSIS — H0288B Meibomian gland dysfunction left eye, upper and lower eyelids: Secondary | ICD-10-CM | POA: Diagnosis not present

## 2023-09-22 NOTE — Telephone Encounter (Signed)
 Copied from CRM [id number]. Topic: Clinical - Medication Refill >> Sep 22, 2023  9:59 AM Elmarie Shiley B wrote: Most Recent Primary Care Visit:  Provider: Dorothyann Peng  Department: [facility]  Visit Type: OFFICE VISIT  Date: 09/01/2023  Medication: BD PEN NEEDLE NANO 2ND GEN 32G X 4 MM MISC.   Has the patient contacted their pharmacy? Yes   (Agent: If yes, when and what did the pharmacy advise?) Contact PCP office, request was sent to the wrong provider who denied prescription.   Is this the correct pharmacy for this prescription? Yes  This is the patient's preferred pharmacy:  [phone], [place] - [address] [place] [address] Phone: [phone] Fax: [phone]   Has the prescription been filled recently? Yes  Is the patient out of the medication? No  Has the patient been seen for an appointment in the last year OR does the patient have an upcoming appointment? Yes  Can we respond through MyChart? No  Agent: Please be advised that Rx refills may take up to 3 business days. We ask that you follow-up with your pharmacy.

## 2023-09-22 NOTE — Progress Notes (Signed)
   09/22/2023  Patient ID: Jearld Pies, female   DOB: 1955-10-30, 68 y.o.   MRN: [id number]  Patient called to inquire about her pen needles and to say she was having trouble getting linked with the clinic on her [employer]. HIPAA identifiers were obtained.  Patient was sent an invitation from the [facility] app but she was unable to get logged on due to password issues.  I called the [facility]ird[facility] Pippins Representative, Arlana Hove and she provided an additional way to link the patient to the clinic.  Katie suggested going through the menu options and choosing "connected at" then choosing "Corey Harold"  then in putting the clinic phone number:  [phone].  This was explained to the patient. She said she did not have a Freestyle Libre on at the time of our call and would go through the steps once she put her sensor back on.    Patient also said she needed refills on her PenNeedles.  Upon review of her chart, the needles had already been ordered.   Plan: Follow up with Patient by Wednesday.  Beecher Mcardle, PharmD, BCACP Clinical Pharmacist [phone]

## 2023-09-24 ENCOUNTER — Other Ambulatory Visit: Payer: Self-pay | Admitting: Internal Medicine

## 2023-09-24 ENCOUNTER — Telehealth: Payer: Self-pay | Admitting: Pharmacist

## 2023-09-24 DIAGNOSIS — Z794 Long term (current) use of insulin: Secondary | ICD-10-CM

## 2023-09-24 MED ORDER — BD PEN NEEDLE NANO 2ND GEN 32G X 4 MM MISC: 3 refills | Status: AC

## 2023-09-24 NOTE — Progress Notes (Signed)
   09/24/2023  Patient ID: Sharon Mcdonald, female   DOB: Apr 02, 1956, 68 y.o.   MRN: [id number]  Patient called back and said her pen needles were not at the pharmacy.  Upon investigation, the pen needles had been documented as refilled but Dr. Allyne Gee did not get the message.  I spoke with her today and she sent the prescription in while I was standing there.  I called the patient back and let her know.  Beecher Mcardle, PharmD, BCACP Clinical Pharmacist [phone]

## 2023-09-25 ENCOUNTER — Telehealth: Payer: Self-pay | Admitting: Pharmacist

## 2023-09-25 DIAGNOSIS — E1165 Type 2 diabetes mellitus with hyperglycemia: Secondary | ICD-10-CM

## 2023-09-25 NOTE — Progress Notes (Signed)
   09/25/2023  Patient ID: Sharon Mcdonald, female   DOB: 1956/04/10, 68 y.o.   MRN: [id number]   Called Patient to follow up on pen needles. HIPAA identifiers were obtained.  Patient reported she was not able to pick up her pen needles yesterday because the Pharmacy had to order them.   While we were talking she received a text stating that her needles were ready for pick up.  She said she would go and pick them up this week.    Also, we have been trying to get the Patient linked to the clnic's "libreview" account so we can see her blood sugars.  She was sent an invitation but she said it required her to put in a password that she did not know.    Alternatively, she can add the clinic to her Freestyle through the app.  She said she did not have a reader on at the time of our conversations so her screen was blank because she did not have a sensor on.  Plan:  Follow up with the Patient in 2 weeks on the Libreview linking.   Beecher Mcardle, PharmD, BCACP Clinical Pharmacist [phone]

## 2023-10-08 ENCOUNTER — Other Ambulatory Visit: Payer: Self-pay | Admitting: Internal Medicine

## 2023-10-28 ENCOUNTER — Telehealth: Payer: Self-pay | Admitting: Pharmacist

## 2023-10-28 DIAGNOSIS — E113593 Type 2 diabetes mellitus with proliferative diabetic retinopathy without macular edema, bilateral: Secondary | ICD-10-CM

## 2023-10-28 MED ORDER — FREESTYLE LIBRE 3 PLUS SENSOR MISC: 3 refills | Status: DC

## 2023-10-28 NOTE — Progress Notes (Signed)
   10/28/2023 Name: Sharon Mcdonald MRN: [id number] DOB: 06-27-1956  Chief Complaint  Patient presents with   Medication Management    Diabetes     Sharon Mcdonald is a 68 y.o. year old female who presented for a telephone visit.   They were referred to the pharmacist by their PCP for assistance in managing diabetes.    Subjective:  Care Team: Primary Care Provider: Cleave Curling, MD ; Next Scheduled Visit: 12/04/2023   Medication Access/Adherence  [facility]acy[facility]  [phone] [place], [place] - [address] [place] [address] Phone: [phone] Fax: [phone]  [facility] - [place], [place] - [address] [place] [address] Phone: [phone] Fax: [phone]   Patient reports affordability concerns with their medications: No  Patient reports access/transportation concerns to their pharmacy: No  Patient reports adherence concerns with their medications:  No     Diabetes:  Current medications:  Mounjaro  12.5 mg weekly Tresiba  20 units daily  On statin therapy-Atorvastatin  20 mg daily  Current glucose readings:    Name: Sharon Mcdonald Date of Birth: 1956-02-13 Report Period: 10/16/2023 - 10/29/2023 (14 days) Generated: 10/29/2023 Time CGM Active: 15%   Glucose Statistics and Targets Average Glucose: 151 mg/dL Glucose Management Indicator (GMI): N/A Glucose Variability (%CV): 20.2% Target Range: 70 - 180 mg/dL  Freestyle Libre CGM Readings:  Time in Ranges Very High: >250 mg/dL --- 0% High: 237 - 628 mg/dL --- 31% Target Range: 70 - 180 mg/dL --- 51% Low: 54 - 69 mg/dL --- 0% Very Low: <76 mg/dL --- 0%     Objective:  Lab Results  Component Value Date   HGBA1C 8.5 (H) 09/01/2023    Lab Results  Component Value Date   CREATININE 0.73 09/01/2023   BUN 12 09/01/2023   NA 144 09/01/2023   K 4.6 09/01/2023   CL 105 09/01/2023   CO2 23 09/01/2023     Lab Results  Component Value Date   CHOL 166 02/17/2023   HDL 50 02/17/2023   LDLCALC 101 (H) 02/17/2023   TRIG 79 02/17/2023   CHOLHDL 3.3 02/17/2023    Medications Reviewed Today   Medications were not reviewed in this encounter       Assessment/Plan:   Diabetes: - Currently uncontrolled A1c 8.5% CGM values look better ( what is visible is for 1 week) - On statin therapy: Atorvastatin  20mg - needs new lipid values -Was able to get the patient linked with the clinic's Libreview page   Follow Up Plan:    Follow up with Patient after her next PCP appt. 12/04/2023   Geronimo Krabbe, PharmD, BCACP Clinical Pharmacist [phone]

## 2023-10-29 ENCOUNTER — Encounter: Payer: Self-pay | Admitting: Pharmacist

## 2023-10-31 ENCOUNTER — Ambulatory Visit
Admission: RE | Admit: 2023-10-31 | Discharge: 2023-10-31 | Disposition: A | Discharge status: Home / Self Care | Source: Ambulatory Visit | Attending: Internal Medicine | Admitting: Internal Medicine

## 2023-10-31 DIAGNOSIS — Z1231 Encounter for screening mammogram for malignant neoplasm of breast: Secondary | ICD-10-CM | POA: Diagnosis not present

## 2023-11-03 ENCOUNTER — Ambulatory Visit: Admitting: Podiatry

## 2023-11-03 ENCOUNTER — Encounter: Payer: Self-pay | Admitting: Podiatry

## 2023-11-03 DIAGNOSIS — E119 Type 2 diabetes mellitus without complications: Secondary | ICD-10-CM | POA: Diagnosis not present

## 2023-11-03 NOTE — Progress Notes (Signed)
  Subjective:  Patient ID: Sharon Mcdonald, female    DOB: 11/15/1955,   MRN: [id number]  Chief Complaint  Patient presents with   Nail Problem    [facility]    68 y.o. female presents for  diabetic foot check today. Denies burning and tingling in their feet. Patient is diabetic and last A1c was  Lab Results  Component Value Date   HGBA1C 8.5 (H) 09/01/2023   .   PCP:  Cleave Curling, MD    . Denies any other pedal complaints. Denies n/v/f/c.   Past Medical History:  Diagnosis Date   Diabetes mellitus    Diabetic retinopathy (HCC)    High cholesterol    Hypertension     Objective:  Physical Exam: Vascular: DP/PT pulses 2/4 bilateral. CFT <3 seconds. Absent hair growth on digits. Edema noted to bilateral lower extremities. Xerosis noted bilaterally.  Skin. No lacerations or abrasions bilateral feet. Nails 1-5 bilateral  are normal in appearance.  Musculoskeletal: MMT 5/5 bilateral lower extremities in DF, PF, Inversion and Eversion. Deceased ROM in DF of ankle joint.  Neurological: Sensation intact to light touch. Protective sensation intact bilateral.    Assessment:   1. Comprehensive diabetic foot examination, type 2 DM, encounter for [facility])      Plan:  Patient was evaluated and treated and all questions answered. -Discussed and educated patient on diabetic foot care, especially with  regards to the vascular, neurological and musculoskeletal systems.  -Stressed the importance of good glycemic control and the detriment of not  controlling glucose levels in relation to the foot. -Discussed supportive shoes at all times and checking feet regularly.  -Answered all patient questions -Patient to return  in 1 year for DM foot check  -Patient advised to call the office if any problems or questions arise in the meantime.   Jennefer Moats, DPM

## 2023-11-25 ENCOUNTER — Other Ambulatory Visit: Payer: Self-pay | Admitting: Internal Medicine

## 2023-12-04 ENCOUNTER — Encounter: Payer: Self-pay | Admitting: Internal Medicine

## 2023-12-04 ENCOUNTER — Ambulatory Visit (INDEPENDENT_AMBULATORY_CARE_PROVIDER_SITE_OTHER): Payer: Medicare Other | Admitting: Internal Medicine

## 2023-12-04 VITALS — BP 100/68 | HR 64 | Temp 97.8°F | Ht 64.0 in | Wt 157.4 lb

## 2023-12-04 DIAGNOSIS — E2839 Other primary ovarian failure: Secondary | ICD-10-CM

## 2023-12-04 DIAGNOSIS — Z794 Long term (current) use of insulin: Secondary | ICD-10-CM

## 2023-12-04 DIAGNOSIS — I10 Essential (primary) hypertension: Secondary | ICD-10-CM | POA: Diagnosis not present

## 2023-12-04 DIAGNOSIS — E113593 Type 2 diabetes mellitus with proliferative diabetic retinopathy without macular edema, bilateral: Secondary | ICD-10-CM | POA: Diagnosis not present

## 2023-12-04 DIAGNOSIS — E663 Overweight: Secondary | ICD-10-CM

## 2023-12-04 DIAGNOSIS — Z6827 Body mass index (BMI) 27.0-27.9, adult: Secondary | ICD-10-CM

## 2023-12-04 DIAGNOSIS — E7800 Pure hypercholesterolemia, unspecified: Secondary | ICD-10-CM

## 2023-12-04 NOTE — Progress Notes (Signed)
 I,[name], CMA,acting as a Neurosurgeon for Sharon Dung, MD.,have documented all relevant documentation on the behalf of Sharon Dung, MD,as directed by  Sharon Dung, MD while in the presence of Sharon Dung, MD.  Subjective:  Patient ID: Sharon Mcdonald , female    DOB: 06-03-1956 , 68 y.o.   MRN: [id number]  Chief Complaint  Patient presents with   Diabetes    Patient presents today for a DM check. Patient doesn't have any specific questions or concerns. Denies headaches, chest pain & sob,.   Hypertension    HPI Discussed the use of AI scribe software for clinical note transcription with the patient, who gave verbal consent to proceed.  History of Present Illness Sharon Mcdonald is a 68 year old female with diabetes and hypertension who presents for a follow-up on her kidney function and A1c levels.  She has been monitoring her blood sugar levels, which have been around 140 mg/dL in the morning, showing improvement from previous levels. She finds her interactions with the new pharmacist, Baruch Likens, helpful in managing her diabetes medications.  Her current medications include Mounjaro  12.5 mg weekly, lisinopril  40 mg daily, Tresiba  20 units nightly, gabapentin  at night, and atorvastatin . There is consideration of adjusting the dose of Mounjaro . She does not feel hungry and is trying to incorporate more protein into her diet through protein shakes, eggs, and nuts.  She engages in physical activities such as playing pickleball and walking.   She has lost five pounds since November, with her current BMI being 27. She has not visited an endocrinologist recently and is due for a bone density test.      Hypertension This is a chronic problem. The current episode started more than 1 year ago. The problem is unchanged. The problem is controlled. Pertinent negatives include no blurred vision.  Diabetes She presents for her follow-up diabetic visit. She has type 2 diabetes  mellitus. Her disease course has been stable. There are no hypoglycemic associated symptoms. Pertinent negatives for diabetes include no blurred vision, no polydipsia, no polyphagia and no polyuria. There are no hypoglycemic complications. Diabetic complications include nephropathy. Risk factors for coronary artery disease include diabetes mellitus, dyslipidemia, hypertension and post-menopausal. She is following a diabetic diet. She participates in exercise three times a week. Her breakfast blood glucose is taken between 7-8 am. Her breakfast blood glucose range is generally 90-110 mg/dl. An ACE inhibitor/angiotensin II receptor blocker is being taken. Eye exam is current.     Past Medical History:  Diagnosis Date   Diabetes mellitus    Diabetic retinopathy (HCC)    High cholesterol    Hypertension      Family History  Problem Relation Age of Onset   Diabetes Mother    Hypertension Mother    Obesity Mother    Diabetes Brother      Current Outpatient Medications:    atorvastatin  (LIPITOR) 20 MG tablet, Take 1 tablet (20 mg total) by mouth daily., Disp: 90 tablet, Rfl: 2   BIOTIN PO, Take by mouth., Disp: , Rfl:    Continuous Glucose Sensor (FREESTYLE LIBRE 3 PLUS SENSOR) MISC, Change sensor every 15 days., Disp: 2 each, Rfl: 3   dorzolamide-timolol (COSOPT) 2-0.5 % ophthalmic solution, Place 1 drop into both eyes 2 (two) times daily., Disp: , Rfl:    gabapentin  (NEURONTIN ) 300 MG capsule, TAKE 1 CAPSULE BY MOUTH EVERY DAY, Disp: 90 capsule, Rfl: 1   insulin  degludec (TRESIBA  FLEXTOUCH) 100  UNIT/ML FlexTouch Pen, Inject 20 Units into the skin daily., Disp: 15 mL, Rfl: 2   Insulin  Pen Needle (BD PEN NEEDLE NANO 2ND GEN) 32G X 4 MM MISC, Use with Tresiba  nightly, Disp: 100 each, Rfl: 3   lisinopril  (ZESTRIL ) 40 MG tablet, Take 1 tablet (40 mg total) by mouth daily., Disp: 90 tablet, Rfl: 3   MOUNJARO  12.5 MG/0.5ML Pen, Inject 12.5 mg into the skin once a week., Disp: 2 mL, Rfl: 1    Multiple Vitamin (MULTIVITAMIN) tablet, Take 1 tablet by mouth daily., Disp: , Rfl:    Omega-3 Fatty Acids (FISH OIL PO), Take by mouth., Disp: , Rfl:    methocarbamol  (ROBAXIN ) 500 MG tablet, TAKE 1 TABLET (500 MG TOTAL) BY MOUTH 2 (TWO) TIMES DAILY AS NEEDED FOR MUSCLE SPASMS. (Patient not taking: Reported on 12/05/2023), Disp: 60 tablet, Rfl: 0   No Known Allergies   Review of Systems  Constitutional: Negative.   Eyes: Negative.  Negative for blurred vision.  Respiratory: Negative.    Cardiovascular: Negative.   Gastrointestinal: Negative.   Endocrine: Negative for polydipsia, polyphagia and polyuria.  Musculoskeletal: Negative.   Skin: Negative.   Psychiatric/Behavioral: Negative.       Today's Vitals   12/04/23 1436  BP: 100/68  Pulse: 64  Temp: 97.8 F (36.6 C)  SpO2: 98%  Weight: 157 lb 6.4 oz (71.4 kg)  Height: 5\' 4"  (1.626 m)   Body mass index is 27.02 kg/m.  Wt Readings from Last 3 Encounters:  12/04/23 157 lb 6.4 oz (71.4 kg)  09/01/23 162 lb (73.5 kg)  05/21/23 164 lb 12.8 oz (74.8 kg)    The ASCVD Risk score (Arnett DK, et al., 2019) failed to calculate for the following reasons:   The systolic blood pressure is missing  Objective:  Physical Exam Vitals and nursing note reviewed.  Constitutional:      Appearance: Normal appearance.  HENT:     Head: Normocephalic and atraumatic.  Eyes:     Extraocular Movements: Extraocular movements intact.  Cardiovascular:     Rate and Rhythm: Normal rate and regular rhythm.     Heart sounds: Normal heart sounds.  Pulmonary:     Effort: Pulmonary effort is normal.     Breath sounds: Normal breath sounds.  Musculoskeletal:     Cervical back: Normal range of motion.  Skin:    General: Skin is warm.  Neurological:     General: No focal deficit present.     Mental Status: She is alert.  Psychiatric:        Mood and Affect: Mood normal.        Behavior: Behavior normal.       Assessment And Plan:  Type 2  diabetes mellitus with proliferative retinopathy of both eyes, with long-term current use of insulin , macular edema presence unspecified, unspecified proliferative retinopathy type (HCC) Assessment & Plan: Type 2 diabetes with morning glucose ~140 mg/dL. Current treatment includes Tresiba  and Mounjaro . Goal: morning glucose <130 mg/dL. Uses Libre 3 for monitoring. Mounjaro  increase to 15 mg considered pending labs. - Order A1c test. - Continue Tresiba  20 units nightly, adjust as needed - Evaluate Mounjaro  increase to 15 mg post-labs. - Encourage consistent protein intake and use of protein shakes. - Risk of muscle atrophy due to lack of strength training and inadequate protein risk was discussed with the patient in detail.   Orders: -     Hemoglobin A1c -     Microalbumin / creatinine urine ratio -  BMP8+EGFR  Benign hypertension Assessment & Plan: Chronic, fair control. She will continue with lisinopril  40mg  daily.  She is encouraged to follow low sodium diet.      Estrogen deficiency Assessment & Plan: I will refer her for bone density. Encouraged to engage in weight-bearing exercises 3-5 days per week.   Orders: -     DG Bone Density; Future  Overweight with body mass index (BMI) of 27 to 27.9 in adult Assessment & Plan: Obesity with BMI 27. 5-pound weight loss since November. Mounjaro  may aid weight loss; monitor for muscle atrophy and protein intake. - Continue monitoring weight and BMI. - Encourage regular physical activity and strength training. - Reassess weight management plan post-labs.    Return if symptoms worsen or fail to improve.  Patient was given opportunity to ask questions. Patient verbalized understanding of the plan and was able to repeat key elements of the plan. All questions were answered to their satisfaction.    I, Sharon Dung, MD, have reviewed all documentation for this visit. The documentation on 12/04/23 for the exam, diagnosis, procedures,  and orders are all accurate and complete.   IF YOU HAVE BEEN REFERRED TO A SPECIALIST, IT MAY TAKE 1-2 WEEKS TO SCHEDULE/PROCESS THE REFERRAL. IF YOU HAVE NOT HEARD FROM US /SPECIALIST IN TWO WEEKS, PLEASE GIVE US  A CALL AT [phone] X [phone].

## 2023-12-04 NOTE — Patient Instructions (Signed)

## 2023-12-05 ENCOUNTER — Ambulatory Visit: Payer: Self-pay | Admitting: Internal Medicine

## 2023-12-05 ENCOUNTER — Ambulatory Visit (INDEPENDENT_AMBULATORY_CARE_PROVIDER_SITE_OTHER)

## 2023-12-05 DIAGNOSIS — Z0000 Encounter for general adult medical examination without abnormal findings: Secondary | ICD-10-CM | POA: Diagnosis not present

## 2023-12-05 LAB — MICROALBUMIN / CREATININE URINE RATIO
Creatinine, Urine: 238.6 mg/dL
Microalb/Creat Ratio: 22 mg/g{creat} (ref 0–29)
Microalbumin, Urine: 51.6 ug/mL

## 2023-12-05 LAB — BMP8+EGFR
BUN/Creatinine Ratio: 26 (ref 12–28)
BUN: 17 mg/dL (ref 8–27)
CO2: 22 mmol/L (ref 20–29)
Calcium: 9.4 mg/dL (ref 8.7–10.3)
Chloride: 106 mmol/L (ref 96–106)
Creatinine, Ser: 0.65 mg/dL (ref 0.57–1.00)
Glucose: 212 mg/dL — ABNORMAL HIGH (ref 70–99)
Potassium: 4.1 mmol/L (ref 3.5–5.2)
Sodium: 142 mmol/L (ref 134–144)
eGFR: 96 mL/min/{1.73_m2} (ref >59)

## 2023-12-05 LAB — HEMOGLOBIN A1C
Est. average glucose Bld gHb Est-mCnc: 209 mg/dL
Hgb A1c MFr Bld: 8.9 % — ABNORMAL HIGH (ref 4.8–5.6)

## 2023-12-05 NOTE — Progress Notes (Signed)
 Subjective:   Sharon Mcdonald is a 68 y.o. female who presents for Medicare Annual (Subsequent) preventive examination.  Visit Complete: Virtual I connected with  Sharon Mcdonald on 12/05/23 by a audio enabled telemedicine application and verified that I am speaking with the correct person using two identifiers.  Patient Location: Home  Provider Location: Office/Clinic  I discussed the limitations of evaluation and management by telemedicine. The patient expressed understanding and agreed to proceed.  Vital Signs: Because this visit was a virtual/telehealth visit, some criteria may be missing or patient reported. Any vitals not documented were not able to be obtained and vitals that have been documented are patient reported.  Patient Medicare AWV questionnaire was completed by the patient on 12/05/2023; I have confirmed that all information answered by patient is correct and no changes since this date.        Objective:     There were no vitals filed for this visit. There is no height or weight on file to calculate BMI.     08/07/2022    2:40 PM 07/26/2021    4:00 PM 07/27/2020    2:21 PM 06/26/2019    8:58 PM  Advanced Directives  Does Patient Have a Medical Advance Directive? Yes Yes Yes No  Type of Estate agent of [place];Living will Healthcare Power of [place];Living will Healthcare Power of Attorney   Copy of Healthcare Power of Attorney in Chart? No - copy requested No - copy requested No - copy requested   Would patient like information on creating a medical advance directive?    No - Guardian declined    Current Medications (verified) Outpatient Encounter Medications as of 12/05/2023  Medication Sig   atorvastatin  (LIPITOR) 20 MG tablet Take 1 tablet (20 mg total) by mouth daily.   BIOTIN PO Take by mouth.   Continuous Glucose Sensor (FREESTYLE LIBRE 3 PLUS SENSOR) MISC Change sensor every 15 days.   dorzolamide-timolol (COSOPT) 2-0.5 %  ophthalmic solution Place 1 drop into both eyes 2 (two) times daily.   gabapentin  (NEURONTIN ) 300 MG capsule TAKE 1 CAPSULE BY MOUTH EVERY DAY   insulin  degludec (TRESIBA  FLEXTOUCH) 100 UNIT/ML FlexTouch Pen Inject 20 Units into the skin daily.   Insulin  Pen Needle (BD PEN NEEDLE NANO 2ND GEN) 32G X 4 MM MISC Use with Tresiba  nightly   lisinopril  (ZESTRIL ) 40 MG tablet Take 1 tablet (40 mg total) by mouth daily.   methocarbamol  (ROBAXIN ) 500 MG tablet TAKE 1 TABLET (500 MG TOTAL) BY MOUTH 2 (TWO) TIMES DAILY AS NEEDED FOR MUSCLE SPASMS. (Patient not taking: Reported on 12/04/2023)   MOUNJARO  12.5 MG/0.5ML Pen Inject 12.5 mg into the skin once a week.   Multiple Vitamin (MULTIVITAMIN) tablet Take 1 tablet by mouth daily.   Omega-3 Fatty Acids (FISH OIL PO) Take by mouth.   No facility-administered encounter medications on file as of 12/05/2023.    Allergies (verified) Patient has no known allergies.   History: Past Medical History:  Diagnosis Date   Diabetes mellitus    Diabetic retinopathy (HCC)    High cholesterol    Hypertension    Past Surgical History:  Procedure Laterality Date   BREAST CYST EXCISION Left    BREAST SURGERY     REDUCTION MAMMAPLASTY Bilateral    TONSILLECTOMY     TUBAL LIGATION     Family History  Problem Relation Age of Onset   Diabetes Mother    Hypertension Mother    Obesity Mother  Diabetes Brother    Social History   Socioeconomic History   Marital status: Married    Spouse name: Not on file   Number of children: Not on file   Years of education: Not on file   Highest education level: Not on file  Occupational History   Not on file  Tobacco Use   Smoking status: Former    Current packs/day: 0.00    Types: Cigarettes    Quit date: 09/07/1978    Years since quitting: 45.2   Smokeless tobacco: Never   Tobacco comments:    1/2 pack per week  Vaping Use   Vaping status: Never Used  Substance and Sexual Activity   Alcohol use: No   Drug  use: No   Sexual activity: Yes    Birth control/protection: Surgical  Other Topics Concern   Not on file  Social History Narrative   Not on file   Social Drivers of Health   Financial Resource Strain: Low Risk  (08/07/2022)   Overall Financial Resource Strain (CARDIA)    Difficulty of Paying Living Expenses: Not hard at all  Food Insecurity: No Food Insecurity (12/06/2022)   Hunger Vital Sign    Worried About Running Out of Food in the Last Year: Never true    Ran Out of Food in the Last Year: Never true  Transportation Needs: No Transportation Needs (12/06/2022)   PRAPARE - Administrator, Civil Service (Medical): No    Lack of Transportation (Non-Medical): No  Physical Activity: Inactive (08/07/2022)   Exercise Vital Sign    Days of Exercise per Week: 0 days    Minutes of Exercise per Session: 0 min  Stress: No Stress Concern Present (08/07/2022)   [employer] of Occupational Health - Occupational Stress Questionnaire    Feeling of Stress : Not at all  Social Connections: Socially Integrated (07/27/2020)   Social Connection and Isolation Panel [NHANES]    Frequency of Communication with Friends and Family: More than three times a week    Frequency of Social Gatherings with Friends and Family: Three times a week    Attends Religious Services: More than 4 times per year    Active Member of Clubs or Organizations: Yes    Attends Banker Meetings: More than 4 times per year    Marital Status: Married    Tobacco Counseling Counseling given: Not Answered Tobacco comments: 1/2 pack per week   Clinical Intake:                        Activities of Daily Living     No data to display          Patient Care Team: Cleave Curling, MD as PCP - General (Internal Medicine) Jannelle Memory, RPH-CPP (Pharmacist)  Indicate any recent Medical Services you may have received from other than [facility] providers in the past year (date may be  approximate).     Assessment:    This is a routine wellness examination for Sharon Mcdonald.  Hearing/Vision screen No results found.   Goals Addressed   None    Depression Screen    12/04/2023    2:36 PM 09/01/2023    3:21 PM 08/07/2022    2:40 PM 07/26/2021    4:01 PM 07/27/2020    2:22 PM 01/03/2020    4:45 PM 04/01/2019   11:32 AM  PHQ 2/9 Scores  PHQ - 2 Score 0 0 0 0  0 0 0  PHQ- 9 Score 0 0         Fall Risk    12/04/2023    2:35 PM 09/01/2023    3:21 PM 08/07/2022    2:40 PM 07/26/2021    4:01 PM 07/27/2020    2:22 PM  Fall Risk   Falls in the past year? 0 0 0 0 0  Number falls in past yr: 0 0 0    Injury with Fall? 0 0 0    Risk for fall due to : No Fall Risks No Fall Risks Medication side effect Medication side effect   Follow up Falls evaluation completed Falls evaluation completed Falls prevention discussed;Education provided;Falls evaluation completed Falls evaluation completed;Education provided;Falls prevention discussed     MEDICARE RISK AT HOME:    TIMED UP AND GO:  Was the test performed?  No    Cognitive Function:        08/07/2022    2:41 PM 07/26/2021    4:02 PM 07/27/2020    2:24 PM  6CIT Screen  What Year? 0 points 0 points 0 points  What month? 0 points 0 points 0 points  What time? 0 points 0 points 0 points  Count back from 20 0 points 0 points 0 points  Months in reverse 2 points 0 points 0 points  Repeat phrase 0 points 0 points 0 points  Total Score 2 points 0 points 0 points    Immunizations Immunization History  Administered Date(s) Administered   DTaP 06/03/2017   PFIZER(Purple Top)SARS-COV-2 Vaccination 09/17/2019, 10/12/2019, 04/24/2020   Pfizer Covid-19 Vaccine Bivalent Booster 36yrs & up 07/03/2021   Pfizer(Comirnaty)Fall Seasonal Vaccine 12 years and older 06/02/2023   Tdap 06/03/2017   Unspecified SARS-COV-2 Vaccination 04/01/2022    TDAP status: Up to date  Flu Vaccine status: Up to date  Pneumococcal vaccine status:  Declined,  Education has been provided regarding the importance of this vaccine but patient still declined. Advised may receive this vaccine at local pharmacy or Health Dept. Aware to provide a copy of the vaccination record if obtained from local pharmacy or Health Dept. Verbalized acceptance and understanding.   Covid-19 vaccine status: Information provided on how to obtain vaccines.   Qualifies for Shingles Vaccine? Yes   Zostavax completed No   Shingrix Completed?: No.    Education has been provided regarding the importance of this vaccine. Patient has been advised to call insurance company to determine out of pocket expense if they have not yet received this vaccine. Advised may also receive vaccine at local pharmacy or Health Dept. Verbalized acceptance and understanding.  Screening Tests Health Maintenance  Topic Date Due   Zoster Vaccines- Shingrix (1 of 2) Never done   Medicare Annual Wellness (AWV)  08/08/2023   Diabetic kidney evaluation - Urine ACR  09/23/2023   COVID-19 Vaccine (7 - Pfizer risk 2024-25 season) 11/30/2023   Pneumonia Vaccine 60+ Years old (1 of 2 - PCV) 08/31/2024 (Originally 07/19/1974)   INFLUENZA VACCINE  02/06/2024   HEMOGLOBIN A1C  02/29/2024   OPHTHALMOLOGY EXAM  03/04/2024   Diabetic kidney evaluation - eGFR measurement  08/31/2024   FOOT EXAM  11/02/2024   Colonoscopy  01/10/2025   MAMMOGRAM  10/30/2025   DTaP/Tdap/Td (3 - Td or Tdap) 06/04/2027   DEXA SCAN  Completed   Hepatitis C Screening  Completed   HPV VACCINES  Aged Out   Meningococcal B Vaccine  Aged Out    Health Maintenance  Health  Maintenance Due  Topic Date Due   Zoster Vaccines- Shingrix (1 of 2) Never done   Medicare Annual Wellness (AWV)  08/08/2023   Diabetic kidney evaluation - Urine ACR  09/23/2023   COVID-19 Vaccine (7 - Pfizer risk 2024-25 season) 11/30/2023    Colorectal cancer screening: Type of screening: Colonoscopy. Completed 10. Repeat every 10 years  Mammogram  status: Completed 10/31/2023. Repeat every year  Bone Density status: Completed 07/13/2021. Results reflect: Bone density results: NORMAL. Repeat every 10 years.  Lung Cancer Screening: (Low Dose CT Chest recommended if Age 11-80 years, 20 pack-year currently smoking OR have quit w/in 15years.) does not qualify.   Lung Cancer Screening Referral: n/a  Additional Screening:  Hepatitis C Screening: does qualify; Completed 06/24/2018  Vision Screening: Recommended annual ophthalmology exams for early detection of glaucoma and other disorders of the eye. Is the patient up to date with their annual eye exam?  Yes  Who is the provider or what is the name of the office in which the patient attends annual eye exams? [facility]  If pt is not established with a provider, would they like to be referred to a provider to establish care? Already established.   Dental Screening: Recommended annual dental exams for proper oral hygiene  Diabetic Foot Exam: Diabetic Foot Exam: Completed 11/03/2023  Community Resource Referral / Chronic Care Management: CRR required this visit?  No   CCM required this visit?  No     Plan:     I have personally reviewed and noted the following in the patient's chart:   Medical and social history Use of alcohol, tobacco or illicit drugs  Current medications and supplements including opioid prescriptions. Patient is not currently taking opioid prescriptions. Functional ability and status Nutritional status Physical activity Advanced directives List of other physicians Hospitalizations, surgeries, and ER visits in previous 12 months Vitals Screenings to include cognitive, depression, and falls Referrals and appointments  In addition, I have reviewed and discussed with patient certain preventive protocols, quality metrics, and best practice recommendations. A written personalized care plan for preventive services as well as general preventive health recommendations were  provided to patient.     Alexis Anger, CMA   12/05/2023   After Visit Summary: (MyChart) Due to this being a telephonic visit, the after visit summary with patients personalized plan was offered to patient via MyChart   Nurse Notes:

## 2023-12-06 NOTE — Assessment & Plan Note (Signed)
 Chronic, fair control. She will continue with lisinopril 40mg  daily.  She is encouraged to follow low sodium diet.

## 2023-12-06 NOTE — Assessment & Plan Note (Signed)
 Obesity with BMI 27. 5-pound weight loss since November. Mounjaro  may aid weight loss; monitor for muscle atrophy and protein intake. - Continue monitoring weight and BMI. - Encourage regular physical activity and strength training. - Reassess weight management plan post-labs.

## 2023-12-06 NOTE — Assessment & Plan Note (Signed)
 I will refer her for bone density. Encouraged to engage in weight-bearing exercises 3-5 days per week.

## 2023-12-06 NOTE — Assessment & Plan Note (Signed)
 Type 2 diabetes with morning glucose ~140 mg/dL. Current treatment includes Tresiba  and Mounjaro . Goal: morning glucose <130 mg/dL. Uses Libre 3 for monitoring. Mounjaro  increase to 15 mg considered pending labs. - Order A1c test. - Continue Tresiba  20 units nightly, adjust as needed - Evaluate Mounjaro  increase to 15 mg post-labs. - Encourage consistent protein intake and use of protein shakes. - Risk of muscle atrophy due to lack of strength training and inadequate protein risk was discussed with the patient in detail.

## 2023-12-11 ENCOUNTER — Telehealth: Payer: Self-pay | Admitting: Pharmacist

## 2023-12-11 DIAGNOSIS — E1165 Type 2 diabetes mellitus with hyperglycemia: Secondary | ICD-10-CM

## 2023-12-11 NOTE — Progress Notes (Signed)
   12/11/2023  Patient ID: Sharon Mcdonald, female   DOB: 1956/02/28, 68 y.o.   MRN: [id number]  Patient was called regarding diabetes management. Unfortunately, she did not answer her phone. HIPAA compliant message was left on her voicemail.Patient was seen by PCP on 12/04/23 her HgA1c increased from 8.5% to 8.9%.  Dr. Elnita Hai increased her Tresiba  dose to 22 units dailiy.   On lipid therapy Atorvastatin  40 mg last filled 12/10/23 for a 90 day supply Lisinopril   40 mg last filled 11/28/23  Needs lipid values drawn    Next PCP appt   02/18/24   Plan:  Follow up with Patient in 2-3 weeks.   Geronimo Krabbe, PharmD, BCACP Clinical Pharmacist [phone]

## 2023-12-15 ENCOUNTER — Telehealth: Payer: Self-pay | Admitting: Pharmacist

## 2023-12-15 DIAGNOSIS — H43813 Vitreous degeneration, bilateral: Secondary | ICD-10-CM | POA: Diagnosis not present

## 2023-12-15 DIAGNOSIS — H3582 Retinal ischemia: Secondary | ICD-10-CM | POA: Diagnosis not present

## 2023-12-15 DIAGNOSIS — E1165 Type 2 diabetes mellitus with hyperglycemia: Secondary | ICD-10-CM

## 2023-12-15 DIAGNOSIS — E113513 Type 2 diabetes mellitus with proliferative diabetic retinopathy with macular edema, bilateral: Secondary | ICD-10-CM | POA: Diagnosis not present

## 2023-12-15 NOTE — Progress Notes (Signed)
 12/15/2023 Name: Sharon Mcdonald MRN: [id number] DOB: 06-21-1956  Chief Complaint  Patient presents with   Medication Management    Diabetes     Sharon Mcdonald is a 68 y.o. year old female who presented for a telephone visit.   They were referred to the pharmacist by their PCP for assistance in managing diabetes. She also has hypertension.   Subjective Patient returned my call from a few weeks ago because she is concerned about her HgA1c.  It is now 8.9- up from 8.5 in February.   Care Team: Primary Care Provider: Cleave Curling, MD ; Next Scheduled Visit: 02/18/2024  Medication Access/Adherence  Current Pharmacy:  [facility] [phone] Jonette Nestle, [place] - [address] [place] [address] Phone: [phone] Fax: [phone]  [facility] - [place], [place] - [address] [place] [address] Phone: [phone] Fax: [phone]   Patient reports affordability concerns with their medications: No  Patient reports access/transportation concerns to their pharmacy: No  Patient reports adherence concerns with their medications:  No      Diabetes:  Current medications:  Tresiba  22 units daily  Mounjaro  12.5 weekly  Current glucose readings:   Name: [name] Date of Birth: [birth year] Report Period: 12/02/2023 - 12/15/2023 (14 days) Generated: 12/15/2023 Time CGM Active: 80%   Glucose Statistics and Targets Average Glucose: 159 mg/dL Glucose Management Indicator (GMI): 7.1% Glucose Variability (%CV): 26.8% Target Range: 70 - 180 mg/dL   Time in Ranges Very High: >250 mg/dL --- 5% High: 756 - 433 mg/dL --- 29% Target Range: 70 - 180 mg/dL --- 51% Low: 54 - 69 mg/dL --- 0% Very Low: <88 mg/dL --- 0%  Current physical activity: Patient reported doing line dancing once per week, pickle ball a couple of times per week and has committed to exercising more.  Hypertension:  Current  medications: Lisinopril  40 mg 1 tablet daily last filled: 11/28/23 90 day supply  On atorvastatin  20 mg 1 tablet daily last filled: 12/26/23 LDL 101 from 2024 --needs new lipids  Objective:  Lab Results  Component Value Date   HGBA1C 8.9 (H) 12/04/2023    Lab Results  Component Value Date   CREATININE 0.65 12/04/2023   BUN 17 12/04/2023   NA 142 12/04/2023   K 4.1 12/04/2023   CL 106 12/04/2023   CO2 22 12/04/2023    Lab Results  Component Value Date   CHOL 166 02/17/2023   HDL 50 02/17/2023   LDLCALC 101 (H) 02/17/2023   TRIG 79 02/17/2023   CHOLHDL 3.3 02/17/2023    Medications Reviewed Today     Reviewed by Geronimo Krabbe, RPH (Pharmacist) on 12/15/23 at 1711  Med List Status: <None>   Medication Order Taking? Sig Documenting Provider Last Dose Status Informant  atorvastatin  (LIPITOR) 20 MG tablet [id number] Yes Take 1 tablet (20 mg total) by mouth daily. Cleave Curling, MD Taking Active   BIOTIN PO [id number] Yes Take by mouth. [provider] Taking Active   Continuous Glucose Sensor (FREESTYLE LIBRE 3 PLUS SENSOR) MISC [id number]  Change sensor every 15 days. Cleave Curling, MD  Active   dorzolamide-timolol (COSOPT) 2-0.5 % ophthalmic solution [id number] Yes Place 1 drop into both eyes 2 (two) times daily. [provider] Taking Active   gabapentin  (NEURONTIN ) 300 MG capsule [id number] Yes TAKE 1 CAPSULE BY MOUTH EVERY DAY Cleave Curling, MD Taking Active   insulin  degludec (TRESIBA  FLEXTOUCH) 100 UNIT/ML FlexTouch  Pen [id number] Yes Inject 20 Units into the skin daily. Cleave Curling, MD Taking Active            Med Note Georganna Kin Dec 15, 2023 10:49 AM) 22 units daily  Insulin  Pen Needle (BD PEN NEEDLE NANO 2ND GEN) 32G X 4 MM MISC [id number] Yes Use with Tresiba  nightly Cleave Curling, MD Taking Active   lisinopril  (ZESTRIL ) 40 MG tablet [id number] Yes Take 1 tablet (40 mg total) by mouth daily. Cleave Curling, MD Taking Active   methocarbamol   (ROBAXIN ) 500 MG tablet [id number] No TAKE 1 TABLET (500 MG TOTAL) BY MOUTH 2 (TWO) TIMES DAILY AS NEEDED FOR MUSCLE SPASMS.  Patient not taking: Reported on 12/04/2023   Melodie Spry, NP Not Taking Active   MOUNJARO  12.5 MG/0.5ML Pen [id number] Yes Inject 12.5 mg into the skin once a week. Cleave Curling, MD Taking Active   Multiple Vitamin (MULTIVITAMIN) tablet [id number]  Take 1 tablet by mouth daily. [provider]  Active   Omega-3 Fatty Acids (FISH OIL PO) [id number] Yes Take by mouth. [provider] Taking Active               Assessment/Plan:   Diabetes: - Currently uncontrolled A1c 8.9% - Reviewed long term cardiovascular and renal outcomes of uncontrolled blood sugar - Reviewed goal A1c, goal fasting, and goal 2 hour post prandial glucose - Reviewed lifestyle modifications including: being consistent with exercise - Patient denies personal or family history of multiple endocrine neoplasia type 2, medullary thyroid  cancer; personal history of pancreatitis or gallbladder disease. -Recommend to continue current therapy   Follow Up Plan:    Follow up with Patient in 1 week for possible insulin  titration.   Geronimo Krabbe, PharmD, BCACP Clinical Pharmacist [phone]

## 2023-12-19 ENCOUNTER — Telehealth: Payer: Self-pay | Admitting: Pharmacist

## 2023-12-19 NOTE — Progress Notes (Signed)
   12/19/2023  Patient ID: Sharon Mcdonald, female   DOB: 1956-03-25, 68 y.o.   MRN: [id number]   Patient called and asked me how she could her her End-of-life documents scanned into her chart.  I contacted, Carter Clare with [employer] and she responded with the following:   These documents can be  uploaded into [employer] by following these steps: Click on ACP:None in the patients story board under their MRN Beside of the document you are uploading click on menu and upload.  This information was sent to the [facility] front office staff.  Patient was called back and a message was left on the Patient's voicemail about brining the documents to the  office for scanning.  Geronimo Krabbe, PharmD, BCACP Clinical Pharmacist [phone]

## 2023-12-19 NOTE — Telephone Encounter (Signed)
-----   Message from Nurse Dellar Fenton L sent at 12/18/2023  5:09 PM EDT ----- Regarding: RE: End of life documents She can upload it to my chart but I am not sure how it would get scanned into her chart. Her best bet is to take a copy to the MD office and let them scan it. ----- Message ----- From: Geronimo Krabbe, [facility] Sent: 12/18/2023   1:14 PM EDT To: Kaylene Pascal, RN Subject: RE: End of life documents                      I think so ----- Message ----- From: Kaylene Pascal, RN Sent: 12/18/2023  12:49 PM EDT To: Geronimo Krabbe, RPH Subject: RE: End of life documents                      Thanks Baruch Likens, does she mean she wants to scan them into her my chart? ----- Message ----- From: Geronimo Krabbe, [facility] Sent: 12/18/2023  10:10 AM EDT To: Kaylene Pascal, RN Subject: End of life documents                          Good morning!  Patient called me this morning wondering how to get her end of life documents scanned into their chart. Do you have any idea or know of anyone I can ask?  I got ready to send a teams message to [facility] but it says she is out of the office.

## 2023-12-22 ENCOUNTER — Telehealth: Payer: Self-pay | Admitting: Pharmacist

## 2023-12-22 DIAGNOSIS — E1165 Type 2 diabetes mellitus with hyperglycemia: Secondary | ICD-10-CM

## 2023-12-22 NOTE — Progress Notes (Signed)
   12/22/2023  Patient ID: Sharon Mcdonald, female   DOB: 1955-08-13, 68 y.o.   MRN: [id number]  Patient was called to follow up on blood sugars. Unfortunately, she did not answer her phone. HIPAA compliant message was left on her voicemail requesting a call back.  The Patient is linked to the [facility] clinic's [employer].  The following report was reviewed:    GMI-7.1% last A1c was 8.9%  Plan: Call Patient back in 2 weeks.   Geronimo Krabbe, PharmD, BCACP Clinical Pharmacist [phone] '

## 2023-12-24 ENCOUNTER — Other Ambulatory Visit: Payer: Self-pay | Admitting: Internal Medicine

## 2024-01-07 ENCOUNTER — Telehealth: Payer: Self-pay | Admitting: Pharmacist

## 2024-01-07 NOTE — Progress Notes (Signed)
   01/07/2024  Patient ID: Sharon Mcdonald, female   DOB: 07-14-1955, 68 y.o.   MRN: [id number]  Patient was called to follow up on diabetes mangement. Unfortunately, she did not answer her phone. HIPAA compliant message was left on her voicemail.  Patient's HgA1c increased to 8.9% she is taking Mounjaro  12.5 mg weekly and Tresiba  20 units daily to control her blood sugars.  She uses Freestyle Libre sensors to monitor her blood sugar. Her blood glucose synopsis from [employer] is as follows:    GMI 7.1% is improved from the 8.9 A1c from earlier.  On Atorvastatin  20 mg last filled 12/24/23 for a 90 day supply LDL-101 mg/dl from 91/75--wzzid new lipid values--SUPD gap closed  Lisinopril  40 mg filled 11/28/23   Plan:  Call Patient back in 1 month. Upcoming PCP appt 02/18/24

## 2024-01-27 ENCOUNTER — Other Ambulatory Visit: Payer: Self-pay | Admitting: Internal Medicine

## 2024-01-30 ENCOUNTER — Telehealth: Payer: Self-pay

## 2024-02-02 DIAGNOSIS — I10 Essential (primary) hypertension: Secondary | ICD-10-CM | POA: Diagnosis not present

## 2024-02-02 DIAGNOSIS — Z1211 Encounter for screening for malignant neoplasm of colon: Secondary | ICD-10-CM | POA: Diagnosis not present

## 2024-02-17 DIAGNOSIS — Z1211 Encounter for screening for malignant neoplasm of colon: Secondary | ICD-10-CM | POA: Diagnosis not present

## 2024-02-17 DIAGNOSIS — K5730 Diverticulosis of large intestine without perforation or abscess without bleeding: Secondary | ICD-10-CM | POA: Diagnosis not present

## 2024-02-17 LAB — HM COLONOSCOPY

## 2024-02-18 ENCOUNTER — Encounter: Payer: Self-pay | Admitting: Internal Medicine

## 2024-02-18 ENCOUNTER — Ambulatory Visit: Payer: Self-pay | Admitting: Internal Medicine

## 2024-02-18 VITALS — BP 118/70 | HR 98 | Temp 98.1°F | Ht 64.0 in | Wt 155.0 lb

## 2024-02-18 DIAGNOSIS — I10 Essential (primary) hypertension: Secondary | ICD-10-CM | POA: Diagnosis not present

## 2024-02-18 DIAGNOSIS — Z794 Long term (current) use of insulin: Secondary | ICD-10-CM | POA: Diagnosis not present

## 2024-02-18 DIAGNOSIS — E7800 Pure hypercholesterolemia, unspecified: Secondary | ICD-10-CM | POA: Diagnosis not present

## 2024-02-18 DIAGNOSIS — H6123 Impacted cerumen, bilateral: Secondary | ICD-10-CM

## 2024-02-18 DIAGNOSIS — Z0000 Encounter for general adult medical examination without abnormal findings: Secondary | ICD-10-CM | POA: Diagnosis not present

## 2024-02-18 DIAGNOSIS — E2839 Other primary ovarian failure: Secondary | ICD-10-CM

## 2024-02-18 DIAGNOSIS — E113593 Type 2 diabetes mellitus with proliferative diabetic retinopathy without macular edema, bilateral: Secondary | ICD-10-CM | POA: Diagnosis not present

## 2024-02-18 DIAGNOSIS — I2510 Atherosclerotic heart disease of native coronary artery without angina pectoris: Secondary | ICD-10-CM

## 2024-02-18 LAB — POCT URINALYSIS DIP (CLINITEK)
Bilirubin, UA: NEGATIVE
Glucose, UA: NEGATIVE mg/dL
Ketones, POC UA: NEGATIVE mg/dL
Nitrite, UA: NEGATIVE
POC PROTEIN,UA: 30 — AB
Spec Grav, UA: >=1.03 — AB (ref 1.010–1.025)
Urobilinogen, UA: 0.2 U/dL
pH, UA: 5.5 (ref 5.0–8.0)

## 2024-02-18 NOTE — Progress Notes (Signed)
 I,Sharon Mcdonald, CMA,acting as a Neurosurgeon for Sharon LOISE Slocumb, MD.,have documented all relevant documentation on the behalf of Sharon LOISE Slocumb, MD,as directed by  Sharon LOISE Slocumb, MD while in the presence of Sharon LOISE Slocumb, MD.  Subjective:    Patient ID: Sharon Mcdonald , female    DOB: 05-15-1956 , 68 y.o.   MRN: [id number]  Chief Complaint  Patient presents with   Annual Exam    Patient presents today for annual exam. She reports compliance with medications. Denies headache, chest pain & sob.     Diabetes   Hypertension    HPI Discussed the use of AI scribe software for clinical note transcription with the patient, who gave verbal consent to proceed.  History of Present Illness Sharon Mcdonald is a 68 year old female with diabetes who presents for a physical and diabetes check.  Her blood sugar levels have been stable, with a recent reading of 123 mg/dL. She uses a Libre 3+ sensor for continuous glucose monitoring and prefers placing it on her thigh due to discomfort on her arm.   She underwent a colonoscopy yesterday, performed earlier than scheduled due to a history of polyps found five years ago. No polyps were found during this recent procedure. She feels bloated post-procedure, but her bowel function is normal.  Her husband is concerned about her endurance during activities like pickleball and tennis. She has no family history of heart disease or stents.  No history of COVID-19 and has been to the ER possibly only twice.   Diabetes She presents for her follow-up diabetic visit. She has type 2 diabetes mellitus. Her disease course has been stable. There are no hypoglycemic associated symptoms. Pertinent negatives for diabetes include no blurred vision and no chest pain. There are no hypoglycemic complications. Diabetic complications include nephropathy. Risk factors for coronary artery disease include diabetes mellitus, dyslipidemia, hypertension and post-menopausal. She  is following a diabetic diet. She participates in exercise three times a week. Her breakfast blood glucose is taken between 7-8 am. Her breakfast blood glucose range is generally 90-110 mg/dl. An ACE inhibitor/angiotensin II receptor blocker is being taken. Eye exam is current.  Hypertension This is a chronic problem. The current episode started more than 1 year ago. The problem is unchanged. The problem is controlled. Pertinent negatives include no blurred vision, chest pain, palpitations or shortness of breath.     Past Medical History:  Diagnosis Date   Diabetes mellitus    Diabetic retinopathy (HCC)    High cholesterol    Hypertension      Family History  Problem Relation Age of Onset   Diabetes Mother    Hypertension Mother    Obesity Mother    Diabetes Brother      Current Outpatient Medications:    atorvastatin  (LIPITOR) 20 MG tablet, Take 1 tablet (20 mg total) by mouth daily., Disp: 90 tablet, Rfl: 2   BIOTIN PO, Take by mouth., Disp: , Rfl:    insulin  degludec (TRESIBA  FLEXTOUCH) 100 UNIT/ML FlexTouch Pen, INJECT 20 UNITS INTO THE SKIN DAILY, Disp: 15 mL, Rfl: 2   Insulin  Pen Needle (BD PEN NEEDLE NANO 2ND GEN) 32G X 4 MM MISC, Use with Tresiba  nightly, Disp: 100 each, Rfl: 3   lisinopril  (ZESTRIL ) 40 MG tablet, Take 1 tablet (40 mg total) by mouth daily., Disp: 90 tablet, Rfl: 3   MOUNJARO  12.5 MG/0.5ML Pen, INJECT 12.5 MG into the skin ONCE A WEEK, Disp: 2 mL, Rfl:  1   Multiple Vitamin (MULTIVITAMIN) tablet, Take 1 tablet by mouth daily., Disp: , Rfl:    Omega-3 Fatty Acids (FISH OIL PO), Take by mouth., Disp: , Rfl:    Continuous Glucose Sensor (FREESTYLE LIBRE 3 PLUS SENSOR) MISC, Change sensor every 15 days., Disp: 2 each, Rfl: 3   dorzolamide-timolol (COSOPT) 2-0.5 % ophthalmic solution, Place 1 drop into both eyes 2 (two) times daily. (Patient not taking: Reported on 02/18/2024), Disp: , Rfl:    gabapentin  (NEURONTIN ) 300 MG capsule, TAKE 1 CAPSULE BY MOUTH EVERY DAY,  Disp: 90 capsule, Rfl: 1   No Known Allergies    The patient states she uses post menopausal status for birth control. No LMP recorded. Patient is postmenopausal.. Negative for Dysmenorrhea. Negative for: breast discharge, breast lump(s), breast pain and breast self exam. Associated symptoms include abnormal vaginal bleeding. Pertinent negatives include abnormal bleeding (hematology), anxiety, decreased libido, depression, difficulty falling sleep, dyspareunia, history of infertility, nocturia, sexual dysfunction, sleep disturbances, urinary incontinence, urinary urgency, vaginal discharge and vaginal itching. Diet regular.The patient states her exercise level is  moderate.  . The patient's tobacco use is:  Social History   Tobacco Use  Smoking Status Former   Current packs/day: 0.00   Types: Cigarettes   Quit date: 09/07/1978   Years since quitting: 45.5  Smokeless Tobacco Never  Tobacco Comments   1/2 pack per week  . She has been exposed to passive smoke. The patient's alcohol use is:  Social History   Substance and Sexual Activity  Alcohol Use No    Review of Systems  Constitutional: Negative.   HENT: Negative.    Eyes: Negative.  Negative for blurred vision.  Respiratory: Negative.  Negative for shortness of breath.   Cardiovascular: Negative.  Negative for chest pain and palpitations.  Gastrointestinal: Negative.   Endocrine: Negative.   Genitourinary: Negative.   Musculoskeletal: Negative.   Skin: Negative.   Allergic/Immunologic: Negative.   Neurological: Negative.   Hematological: Negative.   Psychiatric/Behavioral: Negative.       Today's Vitals   02/18/24 1413  BP: 118/70  Pulse: 98  Temp: 98.1 F (36.7 C)  SpO2: 98%  Weight: 155 lb (70.3 kg)  Height: 5' 4 (1.626 m)   Body mass index is 26.61 kg/m.  Wt Readings from Last 3 Encounters:  02/18/24 155 lb (70.3 kg)  12/04/23 157 lb 6.4 oz (71.4 kg)  09/01/23 162 lb (73.5 kg)     Objective:  Physical  Exam Vitals and nursing note reviewed.  Constitutional:      Appearance: Normal appearance.  HENT:     Head: Normocephalic and atraumatic.     Right Ear: Ear canal and external ear normal. There is impacted cerumen.     Left Ear: Ear canal and external ear normal. There is impacted cerumen.     Nose: Nose normal.     Mouth/Throat:     Mouth: Mucous membranes are moist.     Pharynx: Oropharynx is clear.  Eyes:     Extraocular Movements: Extraocular movements intact.     Conjunctiva/sclera: Conjunctivae normal.     Pupils: Pupils are equal, round, and reactive to light.  Cardiovascular:     Rate and Rhythm: Normal rate and regular rhythm.     Pulses: Normal pulses.          Dorsalis pedis pulses are 2+ on the right side and 2+ on the left side.     Heart sounds: Normal heart sounds.  Pulmonary:  Effort: Pulmonary effort is normal.     Breath sounds: Normal breath sounds.  Chest:  Breasts:    Tanner Score is 5.     Right: Normal.     Left: Normal.     Comments: Bilateral healed surgical scar Abdominal:     General: Abdomen is flat. Bowel sounds are normal.     Palpations: Abdomen is soft.  Genitourinary:    Comments: deferred Musculoskeletal:        General: Normal range of motion.     Cervical back: Normal range of motion and neck supple.  Feet:     Right foot:     Protective Sensation: 5 sites tested.  5 sites sensed.     Skin integrity: Dry skin present.     Toenail Condition: Right toenails are normal.     Left foot:     Protective Sensation: 5 sites tested.  5 sites sensed.     Skin integrity: Dry skin present.     Toenail Condition: Left toenails are normal.  Skin:    General: Skin is warm and dry.  Neurological:     General: No focal deficit present.     Mental Status: She is alert and oriented to person, place, and time.  Psychiatric:        Mood and Affect: Mood normal.        Behavior: Behavior normal.       Assessment And Plan:     Encounter for  annual health examination Assessment & Plan: A full exam was performed.  Importance of monthly self breast exams was discussed with the patient.  She is advised to get 30-45 minutes of regular exercise, no less than four to five days per week. Both weight-bearing and aerobic exercises are recommended.  She is advised to follow a healthy diet with at least six fruits/veggies per day, decrease intake of red meat and other saturated fats and to increase fish intake to twice weekly.  Meats/fish should not be fried -- baked, boiled or broiled is preferable. It is also important to cut back on your sugar intake.  Be sure to read labels - try to avoid anything with added sugar, high fructose corn syrup or other sweeteners.  If you must use a sweetener, you can try stevia or monkfruit.  It is also important to avoid artificially sweetened foods/beverages and diet drinks. Lastly, wear SPF 50 sunscreen on exposed skin and when in direct sunlight for an extended period of time.  Be sure to avoid fast food restaurants and aim for at least 60 ounces of water daily.       Type 2 diabetes mellitus with proliferative retinopathy of both eyes, with long-term current use of insulin , macular edema presence unspecified, unspecified proliferative retinopathy type (HCC) Assessment & Plan: Chronic, diabetic foot exam was performed.  Type 2 diabetes mellitus with improved glycemic control. Current blood glucose levels around 123 mg/dL. Continuous glucose monitoring with Libre 3+ in use. Prefers sensor placement on thigh. - Continue current diabetes management plan. - Continue with Tresiba  20 units at bedtime - Continue with Mounjaro  12.5mg  weekly - Refill prescription for [place] sensors. - Schedule follow-up with diabetes care team.  Orders: -     CBC -     CMP14+EGFR -     Lipid panel -     POCT URINALYSIS DIP (CLINITEK) -     Microalbumin / creatinine urine ratio -     EKG 12-Lead -  Hemoglobin A1c  Benign  hypertension Assessment & Plan: Chronic, fair control. Goal BP <130/80.  EKG performed, NSR w/o acute changes.  She will continue with lisinopril  40mg  daily.  She is encouraged to follow low sodium diet.      Pure hypercholesterolemia Assessment & Plan: Chronic, LDL goal is less than 70.  She will continue with atorvastatin  80mg  daily.  She is encouraged to follow a heart healthy lifestyle.   Orders: -     Lipoprotein A (LPA) -     TSH -     CT CARDIAC SCORING (SELF PAY ONLY); Future  Bilateral impacted cerumen Assessment & Plan: After obtaining verbal consent, both ears were flushed by irrigation. No TM abnormalities were noted. He tolerated procedure well without any complications.     Orders: -     Ear Lavage  Personal history of colonic polyps Recent colonoscopy showed no polyps. Colonoscopy performed a year earlier than scheduled due to previous findings. - Continue routine surveillance colonoscopy every five years unless otherwise indicated.  Return for 1 year physical, 3 month dm check. Patient was given opportunity to ask questions. Patient verbalized understanding of the plan and was able to repeat key elements of the plan. All questions were answered to their satisfaction.   I, Sharon LOISE Slocumb, MD, have reviewed all documentation for this visit. The documentation on 02/18/24 for the exam, diagnosis, procedures, and orders are all accurate and complete.

## 2024-02-18 NOTE — Patient Instructions (Addendum)
 Dr. Tanda Bame    Health Maintenance, Female Adopting a healthy lifestyle and getting preventive care are important in promoting health and wellness. Ask your health care provider about: The right schedule for you to have regular tests and exams. Things you can do on your own to prevent diseases and keep yourself healthy. What should I know about diet, weight, and exercise? Eat a healthy diet  Eat a diet that includes plenty of vegetables, fruits, low-fat dairy products, and lean protein. Do not eat a lot of foods that are high in solid fats, added sugars, or sodium. Maintain a healthy weight Body mass index (BMI) is used to identify weight problems. It estimates body fat based on height and weight. Your health care provider can help determine your BMI and help you achieve or maintain a healthy weight. Get regular exercise Get regular exercise. This is one of the most important things you can do for your health. Most adults should: Exercise for at least 150 minutes each week. The exercise should increase your heart rate and make you sweat (moderate-intensity exercise). Do strengthening exercises at least twice a week. This is in addition to the moderate-intensity exercise. Spend less time sitting. Even light physical activity can be beneficial. Watch cholesterol and blood lipids Have your blood tested for lipids and cholesterol at 68 years of age, then have this test every 5 years. Have your cholesterol levels checked more often if: Your lipid or cholesterol levels are high. You are older than 68 years of age. You are at high risk for heart disease. What should I know about cancer screening? Depending on your health history and family history, you may need to have cancer screening at various ages. This may include screening for: Breast cancer. Cervical cancer. Colorectal cancer. Skin cancer. Lung cancer. What should I know about heart disease, diabetes, and high blood  pressure? Blood pressure and heart disease High blood pressure causes heart disease and increases the risk of stroke. This is more likely to develop in people who have high blood pressure readings or are overweight. Have your blood pressure checked: Every 3-5 years if you are 30-61 years of age. Every year if you are 30 years old or older. Diabetes Have regular diabetes screenings. This checks your fasting blood sugar level. Have the screening done: Once every three years after age 17 if you are at a normal weight and have a low risk for diabetes. More often and at a younger age if you are overweight or have a high risk for diabetes. What should I know about preventing infection? Hepatitis B If you have a higher risk for hepatitis B, you should be screened for this virus. Talk with your health care provider to find out if you are at risk for hepatitis B infection. Hepatitis C Testing is recommended for: Everyone born from [birth year] through 1965. Anyone with known risk factors for hepatitis C. Sexually transmitted infections (STIs) Get screened for STIs, including gonorrhea and chlamydia, if: You are sexually active and are younger than 68 years of age. You are older than 68 years of age and your health care provider tells you that you are at risk for this type of infection. Your sexual activity has changed since you were last screened, and you are at increased risk for chlamydia or gonorrhea. Ask your health care provider if you are at risk. Ask your health care provider about whether you are at high risk for HIV. Your health care provider may recommend a  prescription medicine to help prevent HIV infection. If you choose to take medicine to prevent HIV, you should first get tested for HIV. You should then be tested every 3 months for as long as you are taking the medicine. Pregnancy If you are about to stop having your period (premenopausal) and you may become pregnant, seek counseling before you  get pregnant. Take 400 to 800 micrograms (mcg) of folic acid every day if you become pregnant. Ask for birth control (contraception) if you want to prevent pregnancy. Osteoporosis and menopause Osteoporosis is a disease in which the bones lose minerals and strength with aging. This can result in bone fractures. If you are 54 years old or older, or if you are at risk for osteoporosis and fractures, ask your health care provider if you should: Be screened for bone loss. Take a calcium  or vitamin D supplement to lower your risk of fractures. Be given hormone replacement therapy (HRT) to treat symptoms of menopause. Follow these instructions at home: Alcohol use Do not drink alcohol if: Your health care provider tells you not to drink. You are pregnant, may be pregnant, or are planning to become pregnant. If you drink alcohol: Limit how much you have to: 0-1 drink a day. Know how much alcohol is in your drink. In the [place] one drink equals one 12 oz bottle of beer (355 mL), one 5 oz glass of wine (148 mL), or one 1 oz glass of hard liquor (44 mL). Lifestyle Do not use any products that contain nicotine or tobacco. These products include cigarettes, chewing tobacco, and vaping devices, such as e-cigarettes. If you need help quitting, ask your health care provider. Do not use street drugs. Do not share needles. Ask your health care provider for help if you need support or information about quitting drugs. General instructions Schedule regular health, dental, and eye exams. Stay current with your vaccines. Tell your health care provider if: You often feel depressed. You have ever been abused or do not feel safe at home. Summary Adopting a healthy lifestyle and getting preventive care are important in promoting health and wellness. Follow your health care provider's instructions about healthy diet, exercising, and getting tested or screened for diseases. Follow your health care provider's  instructions on monitoring your cholesterol and blood pressure. This information is not intended to replace advice given to you by your health care provider. Make sure you discuss any questions you have with your health care provider. Document Revised: 11/13/2020 Document Reviewed: 11/13/2020 Elsevier Patient Education  2024 [employer].

## 2024-02-19 ENCOUNTER — Other Ambulatory Visit: Payer: Self-pay | Admitting: Internal Medicine

## 2024-02-19 LAB — LIPID PANEL
Chol/HDL Ratio: 2.7 ratio (ref 0.0–4.4)
Cholesterol, Total: 138 mg/dL (ref 100–199)
HDL: 52 mg/dL (ref >39)
LDL Chol Calc (NIH): 72 mg/dL (ref 0–99)
Triglycerides: 69 mg/dL (ref 0–149)
VLDL Cholesterol Cal: 14 mg/dL (ref 5–40)

## 2024-02-19 LAB — CMP14+EGFR
ALT: 22 IU/L (ref 0–32)
AST: 21 IU/L (ref 0–40)
Albumin: 4.4 g/dL (ref 3.9–4.9)
Alkaline Phosphatase: 77 IU/L (ref 44–121)
BUN/Creatinine Ratio: 13 (ref 12–28)
BUN: 9 mg/dL (ref 8–27)
Bilirubin Total: 0.7 mg/dL (ref 0.0–1.2)
CO2: 23 mmol/L (ref 20–29)
Calcium: 9.4 mg/dL (ref 8.7–10.3)
Chloride: 104 mmol/L (ref 96–106)
Creatinine, Ser: 0.68 mg/dL (ref 0.57–1.00)
Globulin, Total: 2.6 g/dL (ref 1.5–4.5)
Glucose: 173 mg/dL — ABNORMAL HIGH (ref 70–99)
Potassium: 3.8 mmol/L (ref 3.5–5.2)
Sodium: 142 mmol/L (ref 134–144)
Total Protein: 7 g/dL (ref 6.0–8.5)
eGFR: 95 mL/min/1.73 (ref >59)

## 2024-02-19 LAB — CBC
Hematocrit: 37.9 % (ref 34.0–46.6)
Hemoglobin: 12 g/dL (ref 11.1–15.9)
MCH: 27.5 pg (ref 26.6–33.0)
MCHC: 31.7 g/dL (ref 31.5–35.7)
MCV: 87 fL (ref 79–97)
Platelets: 197 x10E3/uL (ref 150–450)
RBC: 4.37 x10E6/uL (ref 3.77–5.28)
RDW: 13.1 % (ref 11.7–15.4)
WBC: 5 x10E3/uL (ref 3.4–10.8)

## 2024-02-19 LAB — LIPOPROTEIN A (LPA): Lipoprotein (a): 365.1 nmol/L — ABNORMAL HIGH (ref <75.0)

## 2024-02-19 LAB — TSH: TSH: 0.649 u[IU]/mL (ref 0.450–4.500)

## 2024-02-19 LAB — HEMOGLOBIN A1C
Est. average glucose Bld gHb Est-mCnc: 171 mg/dL
Hgb A1c MFr Bld: 7.6 % — ABNORMAL HIGH (ref 4.8–5.6)

## 2024-02-19 LAB — MICROALBUMIN / CREATININE URINE RATIO
Creatinine, Urine: 260.1 mg/dL
Microalb/Creat Ratio: 32 mg/g{creat} — ABNORMAL HIGH (ref 0–29)
Microalbumin, Urine: 82 ug/mL

## 2024-02-24 ENCOUNTER — Other Ambulatory Visit: Payer: Self-pay | Admitting: Internal Medicine

## 2024-02-25 ENCOUNTER — Telehealth: Payer: Self-pay | Admitting: Pharmacist

## 2024-02-25 DIAGNOSIS — E1165 Type 2 diabetes mellitus with hyperglycemia: Secondary | ICD-10-CM

## 2024-02-25 DIAGNOSIS — E113593 Type 2 diabetes mellitus with proliferative diabetic retinopathy without macular edema, bilateral: Secondary | ICD-10-CM

## 2024-02-25 MED ORDER — FREESTYLE LIBRE 3 PLUS SENSOR MISC: 3 refills | Status: AC

## 2024-02-25 NOTE — Addendum Note (Signed)
 Addended by: JOLEE CASSIUS PARAS on: 02/25/2024 12:13 PM   Modules accepted: Orders

## 2024-02-25 NOTE — Progress Notes (Signed)
   02/25/2024  Patient ID: Myrick KATHEE Bud, female   DOB: 22-Sep-1955, 68 y.o.   MRN: [id number]  Patient was called to follow up after her 02/18/24 appointment with Dr. Jarold. Unfortunately, she did not answer the phone. HIPAA compliant message was left on her voicemail.    She had labs drawn at her most recent visit: LDL now 72 mg/dl on Atorvastatin  20 mg last filled 12/24/2023 #90  HgA1c now 7.6% down from 8.9% on Mounjaro  12.5 mg and Tresiba  20 units daily last filled 02/24/24 for a 28 days supply  Lisinopril  40 mg filled 11/28/23 #90--b/p at last visit 118/70  Attempted to review the Patient's blood glucose readings report from [facility] but she does not have any data since 12/19/23.   Plan: Follow up with the Patient in 2-3 weeks to attempt to titrate her Mounjaro  to 15 mg weekly  Nylee Barbuto J. Betti Goodenow, PharmD, [facility] Clinical Pharmacist [phone]

## 2024-02-28 DIAGNOSIS — H6123 Impacted cerumen, bilateral: Secondary | ICD-10-CM | POA: Insufficient documentation

## 2024-02-28 NOTE — Assessment & Plan Note (Signed)
 After obtaining verbal consent, both ears were flushed by irrigation. No TM abnormalities were noted. He tolerated procedure well without any complications.

## 2024-02-28 NOTE — Assessment & Plan Note (Signed)

## 2024-02-28 NOTE — Assessment & Plan Note (Signed)
 Chronic, diabetic foot exam was performed.  Type 2 diabetes mellitus with improved glycemic control. Current blood glucose levels around 123 mg/dL. Continuous glucose monitoring with Libre 3+ in use. Prefers sensor placement on thigh. - Continue current diabetes management plan. - Continue with Tresiba  20 units at bedtime - Continue with Mounjaro  12.5mg  weekly - Refill prescription for [place] sensors. - Schedule follow-up with diabetes care team.

## 2024-02-28 NOTE — Assessment & Plan Note (Signed)
 Chronic, LDL goal is less than 70.  She will continue with atorvastatin 80mg  daily.  She is encouraged to follow a heart healthy lifestyle.

## 2024-02-28 NOTE — Assessment & Plan Note (Signed)
 Chronic, fair control. Goal BP <130/80.  EKG performed, NSR w/o acute changes.  She will continue with lisinopril  40mg  daily.  She is encouraged to follow low sodium diet.

## 2024-03-01 ENCOUNTER — Ambulatory Visit (HOSPITAL_COMMUNITY)
Admission: RE | Admit: 2024-03-01 | Discharge: 2024-03-01 | Disposition: A | Discharge status: Home / Self Care | Payer: Self-pay | Source: Ambulatory Visit | Attending: Internal Medicine | Admitting: Internal Medicine

## 2024-03-01 DIAGNOSIS — E7800 Pure hypercholesterolemia, unspecified: Secondary | ICD-10-CM | POA: Insufficient documentation

## 2024-03-03 ENCOUNTER — Encounter: Payer: Self-pay | Admitting: Internal Medicine

## 2024-03-03 ENCOUNTER — Telehealth: Payer: Self-pay | Admitting: Pharmacy Technician

## 2024-03-03 MED ORDER — ASPIRIN 81 MG PO TBEC: 81.0000 mg | DELAYED_RELEASE_TABLET | Freq: Every day | ORAL | Status: AC

## 2024-03-03 NOTE — Progress Notes (Signed)
   03/03/2024  Patient ID: Sharon Mcdonald, female   DOB: 1956/06/12, 68 y.o.   MRN: [id number]  Patient engaged with clinical pharmacist for management of diabetes on 12/15/2023. Outreach by [employer] technician was requested.   Outreached patient to discuss diabetes medication management. Left voicemail for patient to return my call at their convenience.   Liviah Cake, CPhT [facility] Office: [phone] Email: [email or web address]

## 2024-03-17 ENCOUNTER — Telehealth: Payer: Self-pay | Admitting: Pharmacy Technician

## 2024-03-17 NOTE — Progress Notes (Addendum)
   03/17/2024  Patient ID: Myrick KATHEE Bud, female   DOB: [birth year], 68 y.o.   MRN: [id number]  Patient engaged with clinical pharmacist for management of diabetes on 12/15/2023. Outreach by [employer] technician was requested.   Outreached patient to discuss diabetes medication management. Spoke to patient who informs this is not a good time and requested a call back today after 2pm.  Will attempt to reach patient after 2pm today.  ADDENDUM 03/17/2024 2:34pm  Outreach by [facility] technician was requested.   Outreached patient to discuss diabetes medication management after 2pm as requested by patient earlier today. Unfortunately, patient did not answer the phone.  Left voicemail for patient to return my call at their convenience.      Pablo Stauffer, CPhT [facility] Office: [phone] Email: [email or web address]

## 2024-03-21 ENCOUNTER — Other Ambulatory Visit: Payer: Self-pay | Admitting: Internal Medicine

## 2024-03-22 ENCOUNTER — Telehealth: Payer: Self-pay | Admitting: Pharmacy Technician

## 2024-03-22 DIAGNOSIS — E113513 Type 2 diabetes mellitus with proliferative diabetic retinopathy with macular edema, bilateral: Secondary | ICD-10-CM | POA: Diagnosis not present

## 2024-03-22 DIAGNOSIS — H43813 Vitreous degeneration, bilateral: Secondary | ICD-10-CM | POA: Diagnosis not present

## 2024-03-22 DIAGNOSIS — H3582 Retinal ischemia: Secondary | ICD-10-CM | POA: Diagnosis not present

## 2024-03-22 LAB — HM DIABETES EYE EXAM

## 2024-03-22 NOTE — Progress Notes (Signed)
   03/22/2024  Patient ID: Sharon Mcdonald, female   DOB: [birth year], 68 y.o.   MRN: [id number]  Patient engaged with clinical pharmacist for management of diabetes on 12/15/2023. Outreach by [employer] technician was requested.   Outreached patient to discuss diabetes medication management. Left voicemail for patient to return my call at their convenience. Will send patient MyChart message today.   Ilse Billman, CPhT [facility] Office: [phone] Email: [email or web address]

## 2024-03-23 ENCOUNTER — Encounter: Payer: Self-pay | Admitting: Internal Medicine

## 2024-03-23 ENCOUNTER — Other Ambulatory Visit: Payer: Self-pay | Admitting: Internal Medicine

## 2024-03-29 DIAGNOSIS — H0288A Meibomian gland dysfunction right eye, upper and lower eyelids: Secondary | ICD-10-CM | POA: Diagnosis not present

## 2024-03-29 DIAGNOSIS — Z961 Presence of intraocular lens: Secondary | ICD-10-CM | POA: Diagnosis not present

## 2024-03-29 DIAGNOSIS — H26493 Other secondary cataract, bilateral: Secondary | ICD-10-CM | POA: Diagnosis not present

## 2024-03-29 DIAGNOSIS — H0288B Meibomian gland dysfunction left eye, upper and lower eyelids: Secondary | ICD-10-CM | POA: Diagnosis not present

## 2024-03-29 DIAGNOSIS — H401131 Primary open-angle glaucoma, bilateral, mild stage: Secondary | ICD-10-CM | POA: Diagnosis not present

## 2024-04-09 DIAGNOSIS — M85851 Other specified disorders of bone density and structure, right thigh: Secondary | ICD-10-CM | POA: Diagnosis not present

## 2024-04-09 LAB — HM DEXA SCAN

## 2024-04-23 ENCOUNTER — Encounter: Payer: Self-pay | Admitting: Internal Medicine

## 2024-05-18 ENCOUNTER — Other Ambulatory Visit: Payer: Self-pay | Admitting: Internal Medicine

## 2024-05-25 ENCOUNTER — Encounter: Payer: Self-pay | Admitting: Internal Medicine

## 2024-05-25 ENCOUNTER — Ambulatory Visit: Payer: Self-pay | Admitting: Internal Medicine

## 2024-05-25 ENCOUNTER — Ambulatory Visit (INDEPENDENT_AMBULATORY_CARE_PROVIDER_SITE_OTHER): Payer: Self-pay | Admitting: Internal Medicine

## 2024-05-25 ENCOUNTER — Other Ambulatory Visit: Payer: Self-pay | Admitting: Internal Medicine

## 2024-05-25 ENCOUNTER — Other Ambulatory Visit: Payer: Self-pay

## 2024-05-25 VITALS — BP 120/86 | HR 79 | Temp 98.4°F | Ht 64.0 in | Wt 152.4 lb

## 2024-05-25 DIAGNOSIS — N3000 Acute cystitis without hematuria: Secondary | ICD-10-CM

## 2024-05-25 DIAGNOSIS — H6123 Impacted cerumen, bilateral: Secondary | ICD-10-CM | POA: Diagnosis not present

## 2024-05-25 DIAGNOSIS — I119 Hypertensive heart disease without heart failure: Secondary | ICD-10-CM

## 2024-05-25 DIAGNOSIS — Z794 Long term (current) use of insulin: Secondary | ICD-10-CM

## 2024-05-25 DIAGNOSIS — Z20828 Contact with and (suspected) exposure to other viral communicable diseases: Secondary | ICD-10-CM

## 2024-05-25 DIAGNOSIS — I2510 Atherosclerotic heart disease of native coronary artery without angina pectoris: Secondary | ICD-10-CM

## 2024-05-25 DIAGNOSIS — E113593 Type 2 diabetes mellitus with proliferative diabetic retinopathy without macular edema, bilateral: Secondary | ICD-10-CM | POA: Diagnosis not present

## 2024-05-25 DIAGNOSIS — M8580 Other specified disorders of bone density and structure, unspecified site: Secondary | ICD-10-CM

## 2024-05-25 LAB — POCT URINALYSIS DIP (CLINITEK)
Bilirubin, UA: NEGATIVE
Blood, UA: NEGATIVE
Glucose, UA: NEGATIVE mg/dL
Ketones, POC UA: NEGATIVE mg/dL
Nitrite, UA: POSITIVE — AB
POC PROTEIN,UA: NEGATIVE
Spec Grav, UA: 1.025 (ref 1.010–1.025)
Urobilinogen, UA: 1 U/dL
pH, UA: 6 (ref 5.0–8.0)

## 2024-05-25 MED ORDER — NITROFURANTOIN MONOHYD MACRO 100 MG PO CAPS: 100.0000 mg | ORAL_CAPSULE | Freq: Two times a day (BID) | ORAL | 0 refills | Status: AC

## 2024-05-25 MED ORDER — XOFLUZA (40 MG DOSE) 1 X 40 MG PO TBPK: ORAL_TABLET | ORAL | 0 refills | Status: AC

## 2024-05-25 MED ORDER — OSELTAMIVIR PHOSPHATE 75 MG PO CAPS: 75.0000 mg | ORAL_CAPSULE | Freq: Every day | ORAL | 0 refills | Status: DC

## 2024-05-25 NOTE — Progress Notes (Signed)
 I,[name], CMA,acting as a neurosurgeon for [name], MD.,have documented all relevant documentation on the behalf of [name], MD,as directed by  [name], MD while in the presence of [name], MD.  Subjective:  Patient ID: Sharon Mcdonald , female    DOB: [birth year] , 68 y.o.   MRN: [id number]  Chief Complaint  Patient presents with   Hypertension    Patient presents today for a diabetes check. Patient reports compliance with her meds. Patient denies having chest pain,sob or headaches at this time. Patient doesn't have any questions or concerns at this time.    Diabetes    HPI Discussed the use of AI scribe software for clinical note transcription with the patient, who gave verbal consent to proceed.  History of Present Illness Sharon Mcdonald is a 68 year old female with diabetes and hypertension who presents for a blood pressure and diabetes check.  She is here for a follow-up on her diabetes and hypertension. She is currently on atorvastatin , with a recent increase in dose to 40 mg daily. There have been no recent changes in her health status related to these conditions.  She recently underwent a bone density test on October 3rd, which revealed osteopenia. She is continuing with calcium  and vitamin D supplements.  She had a cardiac calcium  score in August, which showed calcifications in four main heart arteries, with a score of 2038. She is currently taking cholesterol medication as prescribed.  Her granddaughter was recently diagnosed with the flu, and she is concerned about contracting it as she is traveling in two weeks. She is interested in taking preventive medication to avoid getting the flu. No shortness of breath or chest pain.   Hypertension This is a chronic problem. The current episode started more than 1 year ago. The problem is unchanged. The problem is controlled. Pertinent negatives include no blurred vision.  Diabetes She  presents for her follow-up diabetic visit. She has type 2 diabetes mellitus. Her disease course has been stable. There are no hypoglycemic associated symptoms. Pertinent negatives for diabetes include no blurred vision, no polydipsia, no polyphagia and no polyuria. There are no hypoglycemic complications. Diabetic complications include nephropathy. Risk factors for coronary artery disease include diabetes mellitus, dyslipidemia, hypertension and post-menopausal. She is following a diabetic diet. She participates in exercise three times a week. Her breakfast blood glucose is taken between 7-8 am. Her breakfast blood glucose range is generally 90-110 mg/dl. An ACE inhibitor/angiotensin II receptor blocker is being taken. Eye exam is current.     Past Medical History:  Diagnosis Date   Diabetes mellitus    Diabetic retinopathy (HCC)    High cholesterol    Hypertension      Family History  Problem Relation Age of Onset   Diabetes Mother    Hypertension Mother    Obesity Mother    Diabetes Brother      Current Outpatient Medications:    aspirin  EC 81 MG tablet, Take 1 tablet (81 mg total) by mouth daily. Swallow whole., Disp: , Rfl:    atorvastatin  (LIPITOR) 20 MG tablet, TAKE 1 TABLET BY MOUTH EVERY DAY, Disp: 90 tablet, Rfl: 2   Baloxavir Marboxil ,40 MG Dose, (XOFLUZA , 40 MG DOSE,) 1 x 40 MG TBPK, One tab po qd, Disp: 1 each, Rfl: 0   BIOTIN PO, Take by mouth., Disp: , Rfl:    Continuous Glucose Sensor (FREESTYLE LIBRE 3 PLUS SENSOR) MISC, Change sensor every  15 days., Disp: 2 each, Rfl: 3   dorzolamide-timolol (COSOPT) 2-0.5 % ophthalmic solution, Place 1 drop into both eyes 2 (two) times daily., Disp: , Rfl:    gabapentin  (NEURONTIN ) 300 MG capsule, TAKE 1 CAPSULE BY MOUTH EVERY DAY, Disp: 90 capsule, Rfl: 1   insulin  degludec (TRESIBA  FLEXTOUCH) 100 UNIT/ML FlexTouch Pen, INJECT 20 UNITS INTO THE SKIN DAILY, Disp: 15 mL, Rfl: 2   Insulin  Pen Needle (BD PEN NEEDLE NANO 2ND GEN) 32G X 4 MM  MISC, Use with Tresiba  nightly, Disp: 100 each, Rfl: 3   lisinopril  (ZESTRIL ) 40 MG tablet, Take 1 tablet (40 mg total) by mouth daily., Disp: 90 tablet, Rfl: 3   Multiple Vitamin (MULTIVITAMIN) tablet, Take 1 tablet by mouth daily., Disp: , Rfl:    Omega-3 Fatty Acids (FISH OIL PO), Take by mouth., Disp: , Rfl:    tirzepatide  (MOUNJARO ) 12.5 MG/0.5ML Pen, INJECT 12.5 MG SUBCUTANEOUSLY ONE TIME PER WEEK, Disp: 2 mL, Rfl: 1   nitrofurantoin , macrocrystal-monohydrate, (MACROBID ) 100 MG capsule, Take 1 capsule (100 mg total) by mouth 2 (two) times daily for 5 days., Disp: 10 capsule, Rfl: 0   oseltamivir  (TAMIFLU ) 75 MG capsule, Take 1 capsule (75 mg total) by mouth daily for 10 days., Disp: 10 capsule, Rfl: 0   No Known Allergies   Review of Systems  Constitutional: Negative.   Eyes: Negative.  Negative for blurred vision.  Respiratory: Negative.    Cardiovascular: Negative.   Gastrointestinal: Negative.   Endocrine: Negative for polydipsia, polyphagia and polyuria.  Musculoskeletal: Negative.   Skin: Negative.   Allergic/Immunologic: Negative.   Psychiatric/Behavioral: Negative.       Today's Vitals   05/25/24 1502 05/25/24 1630  BP: (!) 120/90 120/86  Pulse: 79   Temp: 98.4 F (36.9 C)   TempSrc: Oral   Weight: 152 lb 6.4 oz (69.1 kg)   PainSc: 0-No pain    Body mass index is 26.16 kg/m.  Wt Readings from Last 3 Encounters:  05/25/24 152 lb 6.4 oz (69.1 kg)  02/18/24 155 lb (70.3 kg)  12/04/23 157 lb 6.4 oz (71.4 kg)    The 10-year ASCVD risk score (Arnett DK, et al., 2019) is: 15.5%   Values used to calculate the score:     Age: 47 years     Clincally relevant sex: Female     Is Non-Hispanic African American: Yes     Diabetic: Yes     Tobacco smoker: No     Systolic Blood Pressure: 120 mmHg     Is BP treated: Yes     HDL Cholesterol: 52 mg/dL     Total Cholesterol: 138 mg/dL  Objective:  Physical Exam Vitals and nursing note reviewed.  Constitutional:       Appearance: Normal appearance.  HENT:     Head: Normocephalic and atraumatic.     Right Ear: Ear canal and external ear normal. There is impacted cerumen.     Left Ear: Ear canal and external ear normal. There is impacted cerumen.  Eyes:     Extraocular Movements: Extraocular movements intact.  Cardiovascular:     Rate and Rhythm: Normal rate and regular rhythm.     Heart sounds: Normal heart sounds.  Pulmonary:     Effort: Pulmonary effort is normal.     Breath sounds: Normal breath sounds.  Musculoskeletal:     Cervical back: Normal range of motion.  Skin:    General: Skin is warm.  Neurological:  General: No focal deficit present.     Mental Status: She is alert.  Psychiatric:        Mood and Affect: Mood normal.        Behavior: Behavior normal.      Assessment And Plan:   Assessment & Plan Type 2 diabetes mellitus with proliferative retinopathy of both eyes, with long-term current use of insulin , macular edema presence unspecified, unspecified proliferative retinopathy type (HCC) Chronic, blood sugars appear to be controlled.  Continuous glucose monitoring with Libre 3+ in use.  - Continue current diabetes management plan. - Continue with Tresiba  20 units at bedtime - Continue with Mounjaro  12.5mg  weekly Hypertensive heart disease without heart failure Chronic, fair control. Goal BP <130/80.  She will continue with lisinopril  40mg  daily.  She is encouraged to follow low sodium diet.    Coronary artery calcification Calcium  score 2038. No symptoms. Referral to cardiology for evaluation. Discussed CT coronary angiography and potential stent placement. - Referred to cardiology for further evaluation. Encouraged to keep appt.  - Increased atorvastatin  to 40 mg daily. - Started aspirin  81 mg daily. Acute cystitis without hematuria Diagnosed with UTI, requires antibiotics. - Prescribed antibiotics for UTI. - Send nitrofurantoin  100mg  po bid x 5 days Bilateral impacted  cerumen After obtaining verbal consent, both ears were flushed by irrigation. No TM abnormalities were noted. He tolerated procedure well without any complications.    Osteopenia, unspecified location Confirmed by bone density scan. Advised on exercise and supplements to prevent osteoporosis. - Engage in weight-bearing exercises three days a week. - Continue calcium  and vitamin D supplements. Exposure to the flu Exposed to influenza. Discussed Xofluza  pending insurance coverage. - Checked insurance coverage for Xofluza . - Prescribed Xofluza  if covered.  Orders Placed This Encounter  Procedures   Microalbumin / creatinine urine ratio   CMP14+EGFR   Hemoglobin A1c   POCT URINALYSIS DIP (CLINITEK)   Ear Lavage   Return in 3 months (on 08/25/2024), or dm check.  Patient was given opportunity to ask questions. Patient verbalized understanding of the plan and was able to repeat key elements of the plan. All questions were answered to their satisfaction.   I, [name], MD, have reviewed all documentation for this visit. The documentation on 05/25/24 for the exam, diagnosis, procedures, and orders are all accurate and complete.   IF YOU HAVE BEEN REFERRED TO A SPECIALIST, IT MAY TAKE 1-2 WEEKS TO SCHEDULE/PROCESS THE REFERRAL. IF YOU HAVE NOT HEARD FROM US /SPECIALIST IN TWO WEEKS, PLEASE GIVE US  A CALL AT [phone] X [phone].

## 2024-05-25 NOTE — Patient Instructions (Signed)

## 2024-05-26 LAB — MICROALBUMIN / CREATININE URINE RATIO
Creatinine, Urine: 144.9 mg/dL
Microalb/Creat Ratio: 16 mg/g{creat} (ref 0–29)
Microalbumin, Urine: 23.4 ug/mL

## 2024-05-26 LAB — CMP14+EGFR
ALT: 27 IU/L (ref 0–32)
AST: 24 IU/L (ref 0–40)
Albumin: 4.4 g/dL (ref 3.9–4.9)
Alkaline Phosphatase: 72 IU/L (ref 49–135)
BUN/Creatinine Ratio: 14 (ref 12–28)
BUN: 9 mg/dL (ref 8–27)
Bilirubin Total: 0.5 mg/dL (ref 0.0–1.2)
CO2: 23 mmol/L (ref 20–29)
Calcium: 9.7 mg/dL (ref 8.7–10.3)
Chloride: 106 mmol/L (ref 96–106)
Creatinine, Ser: 0.64 mg/dL (ref 0.57–1.00)
Globulin, Total: 2.6 g/dL (ref 1.5–4.5)
Glucose: 80 mg/dL (ref 70–99)
Potassium: 3.9 mmol/L (ref 3.5–5.2)
Sodium: 144 mmol/L (ref 134–144)
Total Protein: 7 g/dL (ref 6.0–8.5)
eGFR: 96 mL/min/1.73 (ref >59)

## 2024-05-26 LAB — HEMOGLOBIN A1C
Est. average glucose Bld gHb Est-mCnc: 163 mg/dL
Hgb A1c MFr Bld: 7.3 % — ABNORMAL HIGH (ref 4.8–5.6)

## 2024-05-30 DIAGNOSIS — M8580 Other specified disorders of bone density and structure, unspecified site: Secondary | ICD-10-CM | POA: Insufficient documentation

## 2024-05-30 DIAGNOSIS — Z20828 Contact with and (suspected) exposure to other viral communicable diseases: Secondary | ICD-10-CM | POA: Insufficient documentation

## 2024-05-30 NOTE — Assessment & Plan Note (Signed)
 Calcium  score 2038. No symptoms. Referral to cardiology for evaluation. Discussed CT coronary angiography and potential stent placement. - Referred to cardiology for further evaluation. Encouraged to keep appt.  - Increased atorvastatin  to 40 mg daily. - Started aspirin  81 mg daily.

## 2024-05-30 NOTE — Assessment & Plan Note (Addendum)
 Chronic, blood sugars appear to be controlled.  Continuous glucose monitoring with Libre 3+ in use.  - Continue current diabetes management plan. - Continue with Tresiba  20 units at bedtime - Continue with Mounjaro  12.5mg  weekly

## 2024-05-30 NOTE — Assessment & Plan Note (Signed)
 Exposed to influenza. Discussed Xofluza  pending insurance coverage. - Checked insurance coverage for Xofluza . - Prescribed Xofluza  if covered.

## 2024-05-30 NOTE — Assessment & Plan Note (Signed)
 Confirmed by bone density scan. Advised on exercise and supplements to prevent osteoporosis. - Engage in weight-bearing exercises three days a week. - Continue calcium  and vitamin D supplements.

## 2024-05-30 NOTE — Assessment & Plan Note (Signed)
 Diagnosed with UTI, requires antibiotics. - Prescribed antibiotics for UTI. - Send nitrofurantoin  100mg  po bid x 5 days

## 2024-05-30 NOTE — Assessment & Plan Note (Signed)
 After obtaining verbal consent, both ears were flushed by irrigation. No TM abnormalities were noted. He tolerated procedure well without any complications.

## 2024-05-30 NOTE — Assessment & Plan Note (Addendum)
 Chronic, fair control. Goal BP <130/80.  She will continue with lisinopril  40mg  daily.  She is encouraged to follow low sodium diet.

## 2024-05-31 NOTE — Progress Notes (Unsigned)
 Cardiology Office Note:    Date:  06/01/2024   ID:  Sharon Mcdonald, DOB 02-02-1956, MRN [id number]  PCP:  Jarold Medici, MD  Cardiologist:  Lonni LITTIE Nanas, MD  Electrophysiologist:  None   Referring MD: Jarold Medici, MD   Chief Complaint  Patient presents with   Coronary Artery Disease    History of Present Illness:    Sharon Mcdonald is a 68 y.o. female with a hx of hypertension, hyperlipidemia, T2DM who is referred by Dr. Jarold for evaluation of coronary calcifications.  Calcium  score 03/03/2024 was 2038 (99th percentile).  Denies any chest pain, dyspnea, lightheadedness, syncope, lower extremity edema, or palpitations.  She walks 2 days/week for 1 hour, denies any chest pain or dyspnea.  She smoked for 10 years, less than half a pack per day, quit in her late 82s.  No known family history of heart disease.    Past Medical History:  Diagnosis Date   Diabetes mellitus    Diabetic retinopathy (HCC)    High cholesterol    Hypertension     Past Surgical History:  Procedure Laterality Date   BREAST CYST EXCISION Left    BREAST SURGERY     REDUCTION MAMMAPLASTY Bilateral    TONSILLECTOMY     TUBAL LIGATION      Current Medications: Current Meds  Medication Sig   aspirin  EC 81 MG tablet Take 1 tablet (81 mg total) by mouth daily. Swallow whole.   Baloxavir Marboxil ,40 MG Dose, (XOFLUZA , 40 MG DOSE,) 1 x 40 MG TBPK One tab po qd   BIOTIN PO Take by mouth.   Continuous Glucose Sensor (FREESTYLE LIBRE 3 PLUS SENSOR) MISC Change sensor every 15 days.   dorzolamide-timolol (COSOPT) 2-0.5 % ophthalmic solution Place 1 drop into both eyes 2 (two) times daily.   gabapentin  (NEURONTIN ) 300 MG capsule TAKE 1 CAPSULE BY MOUTH EVERY DAY   insulin  degludec (TRESIBA  FLEXTOUCH) 100 UNIT/ML FlexTouch Pen INJECT 20 UNITS INTO THE SKIN DAILY   Insulin  Pen Needle (BD PEN NEEDLE NANO 2ND GEN) 32G X 4 MM MISC Use with Tresiba  nightly   lisinopril  (ZESTRIL ) 40 MG tablet Take  1 tablet (40 mg total) by mouth daily.   Multiple Vitamin (MULTIVITAMIN) tablet Take 1 tablet by mouth daily.   Omega-3 Fatty Acids (FISH OIL PO) Take by mouth.   tirzepatide  (MOUNJARO ) 12.5 MG/0.5ML Pen INJECT 12.5 MG SUBCUTANEOUSLY ONE TIME PER WEEK   [DISCONTINUED] atorvastatin  (LIPITOR) 20 MG tablet TAKE 1 TABLET BY MOUTH EVERY DAY     Allergies:   Patient has no known allergies.   Social History   Socioeconomic History   Marital status: Married    Spouse name: Not on file   Number of children: Not on file   Years of education: Not on file   Highest education level: Not on file  Occupational History   Not on file  Tobacco Use   Smoking status: Former    Current packs/day: 0.00    Types: Cigarettes    Quit date: 09/07/1978    Years since quitting: 45.7   Smokeless tobacco: Never   Tobacco comments:    1/2 pack per week  Vaping Use   Vaping status: Never Used  Substance and Sexual Activity   Alcohol use: No   Drug use: No   Sexual activity: Yes    Birth control/protection: Surgical  Other Topics Concern   Not on file  Social History Narrative   Not on file  Social Drivers of Corporate Investment Banker Strain: Low Risk  (12/05/2023)   Overall Financial Resource Strain (CARDIA)    Difficulty of Paying Living Expenses: Not hard at all  Food Insecurity: No Food Insecurity (12/05/2023)   Hunger Vital Sign    Worried About Running Out of Food in the Last Year: Never true    Ran Out of Food in the Last Year: Never true  Transportation Needs: No Transportation Needs (12/05/2023)   PRAPARE - Administrator, Civil Service (Medical): No    Lack of Transportation (Non-Medical): No  Physical Activity: Sufficiently Active (12/05/2023)   Exercise Vital Sign    Days of Exercise per Week: 3 days    Minutes of Exercise per Session: 60 min  Stress: No Stress Concern Present (12/05/2023)   [employer] of Occupational Health - Occupational Stress Questionnaire     Feeling of Stress : Not at all  Social Connections: Socially Integrated (12/05/2023)   Social Connection and Isolation Panel    Frequency of Communication with Friends and Family: More than three times a week    Frequency of Social Gatherings with Friends and Family: Three times a week    Attends Religious Services: More than 4 times per year    Active Member of Clubs or Organizations: Yes    Attends Engineer, Structural: More than 4 times per year    Marital Status: Married     Family History: The patient's family history includes Diabetes in her brother and mother; Hypertension in her mother; Obesity in her mother.  ROS:   Please see the history of present illness.     All other systems reviewed and are negative.  EKGs/Labs/Other Studies Reviewed:    The following studies were reviewed today:   EKG:   02/18/2024: Normal sinus rhythm, rate 85  Recent Labs: 02/18/2024: Hemoglobin 12.0; Platelets 197; TSH 0.649 05/25/2024: ALT 27; BUN 9; Creatinine, Ser 0.64; Potassium 3.9; Sodium 144  Recent Lipid Panel    Component Value Date/Time   CHOL 138 02/18/2024 1527   TRIG 69 02/18/2024 1527   HDL 52 02/18/2024 1527   CHOLHDL 2.7 02/18/2024 1527   CHOLHDL 3 03/29/2022 1002   VLDL 9.6 03/29/2022 1002   LDLCALC 72 02/18/2024 1527    Physical Exam:    VS:  BP 130/80   Pulse 99   Ht 5' 4 (1.626 m)   Wt 153 lb (69.4 kg)   SpO2 97%   BMI 26.26 kg/m     Wt Readings from Last 3 Encounters:  06/01/24 153 lb (69.4 kg)  05/25/24 152 lb 6.4 oz (69.1 kg)  02/18/24 155 lb (70.3 kg)     GEN:  Well nourished, well developed in no acute distress HEENT: Normal NECK: No JVD; No carotid bruits LYMPHATICS: No lymphadenopathy CARDIAC: RRR, 2 out of 6 systolic murmur RESPIRATORY:  Clear to auscultation without rales, wheezing or rhonchi  ABDOMEN: Soft, non-tender, non-distended MUSCULOSKELETAL:  No edema; No deformity  SKIN: Warm and dry NEUROLOGIC:  Alert and oriented x  3 PSYCHIATRIC:  Normal affect   ASSESSMENT:    1. Elevated coronary artery calcium  score   2. Essential hypertension   3. Medication management   4. Heart murmur    PLAN:    CAD: Calcium  score 03/03/2024 was 2038 (99th percentile). - Start aspirin  81 mg daily - Increase atorvastatin  to 40 mg daily - Warrants ischemic evaluation given markedly elevated calcium  score.  Recommend stress PET  Heart murmur: Check echocardiogram.  Aortic and mitral annular calcifications noted on calcium  score  Hypertension: On lisinopril  40 mg daily.  Appears controlled  Hyperlipidemia: On atorvastatin  20 mg daily.  LDL 72 on 02/2024.  Increase atorvastatin  to 40 mg daily as above  T2DM: On insulin  and Mounjaro .  A1c 7.3% 05/2024   RTC in 6 months   Informed Consent   Shared Decision Making/Informed Consent The risks [chest pain, shortness of breath, cardiac arrhythmias, dizziness, blood pressure fluctuations, myocardial infarction, stroke/transient ischemic attack, nausea, vomiting, allergic reaction, radiation exposure, metallic taste sensation and life-threatening complications (estimated to be 1 in 10,000)], benefits (risk stratification, diagnosing coronary artery disease, treatment guidance) and alternatives of a cardiac PET stress test were discussed in detail with Ms. Plancarte and she agrees to proceed.        Medication Adjustments/Labs and Tests Ordered: Current medicines are reviewed at length with the patient today.  Concerns regarding medicines are outlined above.  Orders Placed This Encounter  Procedures   NM PET CT CARDIAC PERFUSION MULTI W/ABSOLUTE BLOODFLOW   NM PET CT CARDIAC RAD READ   ECHOCARDIOGRAM COMPLETE   Meds ordered this encounter  Medications   atorvastatin  (LIPITOR) 40 MG tablet    Sig: Take 1 tablet (40 mg total) by mouth daily.    Dispense:  90 tablet    Refill:  2    Patient Instructions  Medication Instructions:  Please Start taking: Aspirin  Enteric  Coated 81 mg once daily (Over The Counter)  Increase: Atorvastatin  (Lipitor) to 40 mg once daily  Lab Work: None  Testing/Procedures:  Your physician has requested that you have an echocardiogram. Echocardiography is a painless test that uses sound waves to create images of your heart. It provides your doctor with information about the size and shape of your heart and how well your heart's chambers and valves are working. This procedure takes approximately one hour. There are no restrictions for this procedure. Please do NOT wear cologne, perfume, aftershave, or lotions (deodorant is allowed). Please arrive 15 minutes prior to your appointment time.  Please note: We ask at that you not bring children with you during ultrasound (echo/ vascular) testing. Due to room size and safety concerns, children are not allowed in the ultrasound rooms during exams. Our front office staff cannot provide observation of children in our lobby area while testing is being conducted. An adult accompanying a patient to their appointment will only be allowed in the ultrasound room at the discretion of the ultrasound technician under special circumstances. We apologize for any inconvenience.    Your Cardiac PET Stress Test will be at one of the below locations:   Please report to Radiology at the [facility] Main Entrance 30 minutes early for your test.  [address] [place], [place] [address]                         OR   Please report to Radiology at [facility] Main Entrance, medical mall, 30 mins prior to your test.  [address]  [place], [place]  How to Prepare for Your Cardiac PET/CT Stress Test:  Nothing to eat or drink, except water, 3 hours prior to arrival time.  NO caffeine/decaffeinated products, or chocolate 12 hours prior to arrival. (Please note decaffeinated beverages (teas/coffees) still contain caffeine).  If you have caffeine within 12 hours prior,  the test will need to be rescheduled.  Medication  instructions: Do not take erectile dysfunction medications for 72 hours prior to test (sildenafil, tadalafil) Do not take nitrates (isosorbide mononitrate, Ranexa) the day before or day of test Do not take tamsulosin the day before or morning of test Hold theophylline containing medications for 12 hours. Hold Dipyridamole 48 hours prior to the test.  Diabetic Preparation: If able to eat breakfast prior to 3 hour fasting, you may take all medications, including your insulin . Do not worry if you miss your breakfast dose of insulin  - start at your next meal. If you do not eat prior to 3 hour fast-Hold all diabetes (oral and insulin ) medications. Patients who wear a continuous glucose monitor MUST remove the device prior to scanning.  You may take your remaining medications with water.  NO perfume, cologne or lotion on chest or abdomen area. FEMALES - Please avoid wearing dresses to this appointment.  Total time is 1 to 2 hours; you may want to bring reading material for the waiting time.  IF YOU THINK YOU MAY BE PREGNANT, OR ARE NURSING PLEASE INFORM THE TECHNOLOGIST.  In preparation for your appointment, medication and supplies will be purchased.  Appointment availability is limited, so if you need to cancel or reschedule, please call the Radiology Department Scheduler at [phone] 24 hours in advance to avoid a cancellation fee of $100.00  What to Expect When you Arrive:  Once you arrive and check in for your appointment, you will be taken to a preparation room within the Radiology Department.  A technologist or Nurse will obtain your medical history, verify that you are correctly prepped for the exam, and explain the procedure.  Afterwards, an IV will be started in your arm and electrodes will be placed on your skin for EKG monitoring during the stress portion of the exam. Then you will be escorted to the PET/CT scanner.  There, staff  will get you positioned on the scanner and obtain a blood pressure and EKG.  During the exam, you will continue to be connected to the EKG and blood pressure machines.  A small, safe amount of a radioactive tracer will be injected in your IV to obtain a series of pictures of your heart along with an injection of a stress agent.    After your Exam:  It is recommended that you eat a meal and drink a caffeinated beverage to counter act any effects of the stress agent.  Drink plenty of fluids for the remainder of the day and urinate frequently for the first couple of hours after the exam.  Your doctor will inform you of your test results within 7-10 business days.  For more information and frequently asked questions, please visit our website: [email or web address]  For questions about your test or how to prepare for your test, please call: Cardiac Imaging Nurse Navigators Office: [phone]   Follow-Up: At [facility], you and your health needs are our priority.  As part of our continuing mission to provide you with exceptional heart care, our providers are all part of one team.  This team includes your primary Cardiologist (physician) and Advanced Practice Providers or APPs (Physician Assistants and Nurse Practitioners) who all work together to provide you with the care you need, when you need it.  Your next appointment:   6 month(s) (Due Nov 29, 2024)  Provider:   Lonni LITTIE Nanas, MD            Signed, Lonni LITTIE Nanas, MD  06/01/2024 5:02 PM  [facility]

## 2024-06-01 ENCOUNTER — Encounter: Payer: Self-pay | Admitting: Cardiology

## 2024-06-01 ENCOUNTER — Ambulatory Visit: Attending: Cardiology | Admitting: Cardiology

## 2024-06-01 VITALS — BP 130/80 | HR 99 | Ht 64.0 in | Wt 153.0 lb

## 2024-06-01 DIAGNOSIS — Z79899 Other long term (current) drug therapy: Secondary | ICD-10-CM

## 2024-06-01 DIAGNOSIS — R931 Abnormal findings on diagnostic imaging of heart and coronary circulation: Secondary | ICD-10-CM

## 2024-06-01 DIAGNOSIS — R011 Cardiac murmur, unspecified: Secondary | ICD-10-CM

## 2024-06-01 DIAGNOSIS — I10 Essential (primary) hypertension: Secondary | ICD-10-CM | POA: Diagnosis not present

## 2024-06-01 MED ORDER — ATORVASTATIN CALCIUM 40 MG PO TABS: 40.0000 mg | ORAL_TABLET | Freq: Every day | ORAL | 2 refills | Status: AC

## 2024-06-01 NOTE — Patient Instructions (Signed)
 Medication Instructions:  Please Start taking: Aspirin  Enteric Coated 81 mg once daily (Over The Counter)  Increase: Atorvastatin  (Lipitor) to 40 mg once daily  Lab Work: None  Testing/Procedures:  Your physician has requested that you have an echocardiogram. Echocardiography is a painless test that uses sound waves to create images of your heart. It provides your doctor with information about the size and shape of your heart and how well your heart's chambers and valves are working. This procedure takes approximately one hour. There are no restrictions for this procedure. Please do NOT wear cologne, perfume, aftershave, or lotions (deodorant is allowed). Please arrive 15 minutes prior to your appointment time.  Please note: We ask at that you not bring children with you during ultrasound (echo/ vascular) testing. Due to room size and safety concerns, children are not allowed in the ultrasound rooms during exams. Our front office staff cannot provide observation of children in our lobby area while testing is being conducted. An adult accompanying a patient to their appointment will only be allowed in the ultrasound room at the discretion of the ultrasound technician under special circumstances. We apologize for any inconvenience.    Your Cardiac PET Stress Test will be at one of the below locations:   Please report to Radiology at the [facility] Main Entrance 30 minutes early for your test.  [address] [place], [place] [address]                         OR   Please report to Radiology at [facility] Main Entrance, medical mall, 30 mins prior to your test.  [address]  [place], [place]  How to Prepare for Your Cardiac PET/CT Stress Test:  Nothing to eat or drink, except water, 3 hours prior to arrival time.  NO caffeine/decaffeinated products, or chocolate 12 hours prior to arrival. (Please note decaffeinated beverages (teas/coffees) still  contain caffeine).  If you have caffeine within 12 hours prior, the test will need to be rescheduled.  Medication instructions: Do not take erectile dysfunction medications for 72 hours prior to test (sildenafil, tadalafil) Do not take nitrates (isosorbide mononitrate, Ranexa) the day before or day of test Do not take tamsulosin the day before or morning of test Hold theophylline containing medications for 12 hours. Hold Dipyridamole 48 hours prior to the test.  Diabetic Preparation: If able to eat breakfast prior to 3 hour fasting, you may take all medications, including your insulin . Do not worry if you miss your breakfast dose of insulin  - start at your next meal. If you do not eat prior to 3 hour fast-Hold all diabetes (oral and insulin ) medications. Patients who wear a continuous glucose monitor MUST remove the device prior to scanning.  You may take your remaining medications with water.  NO perfume, cologne or lotion on chest or abdomen area. FEMALES - Please avoid wearing dresses to this appointment.  Total time is 1 to 2 hours; you may want to bring reading material for the waiting time.  IF YOU THINK YOU MAY BE PREGNANT, OR ARE NURSING PLEASE INFORM THE TECHNOLOGIST.  In preparation for your appointment, medication and supplies will be purchased.  Appointment availability is limited, so if you need to cancel or reschedule, please call the Radiology Department Scheduler at [phone] 24 hours in advance to avoid a cancellation fee of $100.00  What to Expect When you Arrive:  Once you arrive and  check in for your appointment, you will be taken to a preparation room within the Radiology Department.  A technologist or Nurse will obtain your medical history, verify that you are correctly prepped for the exam, and explain the procedure.  Afterwards, an IV will be started in your arm and electrodes will be placed on your skin for EKG monitoring during the stress portion of the exam.  Then you will be escorted to the PET/CT scanner.  There, staff will get you positioned on the scanner and obtain a blood pressure and EKG.  During the exam, you will continue to be connected to the EKG and blood pressure machines.  A small, safe amount of a radioactive tracer will be injected in your IV to obtain a series of pictures of your heart along with an injection of a stress agent.    After your Exam:  It is recommended that you eat a meal and drink a caffeinated beverage to counter act any effects of the stress agent.  Drink plenty of fluids for the remainder of the day and urinate frequently for the first couple of hours after the exam.  Your doctor will inform you of your test results within 7-10 business days.  For more information and frequently asked questions, please visit our website: [email or web address]  For questions about your test or how to prepare for your test, please call: Cardiac Imaging Nurse Navigators Office: [phone]   Follow-Up: At [facility], you and your health needs are our priority.  As part of our continuing mission to provide you with exceptional heart care, our providers are all part of one team.  This team includes your primary Cardiologist (physician) and Advanced Practice Providers or APPs (Physician Assistants and Nurse Practitioners) who all work together to provide you with the care you need, when you need it.  Your next appointment:   6 month(s) (Due Nov 29, 2024)  Provider:   Lonni LITTIE Nanas, MD

## 2024-06-25 ENCOUNTER — Encounter (HOSPITAL_COMMUNITY): Payer: Self-pay

## 2024-06-28 ENCOUNTER — Telehealth (HOSPITAL_COMMUNITY): Payer: Self-pay | Admitting: Emergency Medicine

## 2024-06-28 NOTE — Telephone Encounter (Signed)
 Reaching out to patient to offer assistance regarding upcoming cardiac imaging study; pt verbalizes understanding of appt date/time, parking situation and where to check in, pre-test NPO status and medications ordered, and verified current allergies; name and call back number provided for further questions should they arise [place] RN Navigator Cardiac Imaging [facility] [phone] office [phone] cell

## 2024-06-29 ENCOUNTER — Telehealth: Payer: Self-pay

## 2024-06-29 ENCOUNTER — Ambulatory Visit (HOSPITAL_COMMUNITY)
Admission: RE | Admit: 2024-06-29 | Discharge: 2024-06-29 | Disposition: A | Discharge status: Home / Self Care | Source: Ambulatory Visit | Attending: Cardiology | Admitting: Cardiology

## 2024-06-29 DIAGNOSIS — I2510 Atherosclerotic heart disease of native coronary artery without angina pectoris: Secondary | ICD-10-CM

## 2024-06-29 DIAGNOSIS — R9389 Abnormal findings on diagnostic imaging of other specified body structures: Secondary | ICD-10-CM

## 2024-06-29 DIAGNOSIS — R918 Other nonspecific abnormal finding of lung field: Secondary | ICD-10-CM | POA: Insufficient documentation

## 2024-06-29 DIAGNOSIS — R931 Abnormal findings on diagnostic imaging of heart and coronary circulation: Secondary | ICD-10-CM

## 2024-06-29 LAB — NM PET CT CARDIAC PERFUSION MULTI W/ABSOLUTE BLOODFLOW
LV dias vol: 61 mL (ref 46–106)
MBFR: 2.3
Nuc Rest EF: 52 %
Nuc Stress EF: 56 %
Peak HR: 108 {beats}/min
Rest HR: 80 {beats}/min
Rest MBF: 0.86 ml/g/min
Rest Nuclear Isotope Dose: 17.9 mCi
ST Depression (mm): 0 mm
Stress MBF: 1.98 ml/g/min
Stress Nuclear Isotope Dose: 17.8 mCi
TID: 1.13

## 2024-06-29 MED ORDER — REGADENOSON 0.4 MG/5ML IV SOLN
0.4000 mg | Freq: Once | INTRAVENOUS | Status: AC
  Administered 2024-06-29: 0.4 mg via INTRAVENOUS

## 2024-06-29 MED ORDER — REGADENOSON 0.4 MG/5ML IV SOLN
INTRAVENOUS | Status: AC
  Filled 2024-06-29: qty 5

## 2024-06-29 MED ORDER — RUBIDIUM RB82 GENERATOR (RUBYFILL)
17.8000 | PACK | Freq: Once | INTRAVENOUS | Status: AC
  Administered 2024-06-29: 17.8 via INTRAVENOUS

## 2024-06-29 NOTE — Telephone Encounter (Signed)
 Call transferred from operator, spoke to [name] at [facility]. Sharon Mcdonald reports that early finding on stress/pet/ct images should be in epic showing:  There are nonspecific ground-glass opacities in the bilateral lower lobe with subpleural sparing. Findings are nonspecific and differential diagnosis includes pulmonary edema and less likely NSIP, alveolar hemorrhage, etc.  Reviewed with DOD Dr. Sheena who recommends routine referral to pulmonology at this time. Referral placed, called and discussed preliminary results with patient who agrees to referral.

## 2024-06-29 NOTE — Progress Notes (Signed)
 [name].

## 2024-06-29 NOTE — Telephone Encounter (Signed)
 Called patient to ask if she had any further questions about findings on CT scan of lungs. Patient asked for information to be repeated so she could write it down. Reviewed all information, patient verbalizes information.

## 2024-06-29 NOTE — Telephone Encounter (Signed)
Patient is returning phone call. Please advise.

## 2024-07-02 ENCOUNTER — Ambulatory Visit: Payer: Self-pay | Admitting: Cardiology

## 2024-07-02 NOTE — Telephone Encounter (Signed)
 Agree with plan for pulmonology referral

## 2024-07-09 ENCOUNTER — Telehealth: Payer: Self-pay | Admitting: Cardiology

## 2024-07-09 NOTE — Telephone Encounter (Signed)
 Spoke with pt on the phone. Pt states she was under the impression that if she did the PET CT she would not need to do the ECHO. Pt asking for clarification if she still needs to do the Echo or if she can cancel. Will forward to Dr Kate for advisement.

## 2024-07-09 NOTE — Telephone Encounter (Signed)
 Called the patient and made her aware that Dr. Kate recommended that Echocardiogram is for heart murmur and  she had valvular calcifications on her calcium  score. Made patient aware per Dr. Kate we cannot evaluate her heart valves with PET scan, so she needs Echo. Patient verbalized an understanding and verbalized appointment for Echo has already been made. Pt verbalized she has an appointment 2/6,

## 2024-07-09 NOTE — Telephone Encounter (Signed)
 Echocardiogram is for heart murmur and had valvular calcifications on her calcium  score.  We cannot evaluate her heart valves with PET scan, needs echo

## 2024-07-09 NOTE — Telephone Encounter (Signed)
 PT wants to know why she has to get an echocardiogram, appt is scheduled for 08/13/24. PT is asking for clarification.

## 2024-07-16 ENCOUNTER — Ambulatory Visit (HOSPITAL_COMMUNITY)

## 2024-07-19 ENCOUNTER — Other Ambulatory Visit: Payer: Self-pay | Admitting: Internal Medicine

## 2024-07-27 ENCOUNTER — Encounter: Payer: Self-pay | Admitting: Internal Medicine

## 2024-07-27 ENCOUNTER — Ambulatory Visit: Admitting: Internal Medicine

## 2024-07-27 VITALS — BP 110/89 | HR 85 | Temp 98.3°F | Ht 61.0 in | Wt 157.2 lb

## 2024-07-27 DIAGNOSIS — Z87891 Personal history of nicotine dependence: Secondary | ICD-10-CM | POA: Diagnosis not present

## 2024-07-27 DIAGNOSIS — R918 Other nonspecific abnormal finding of lung field: Secondary | ICD-10-CM

## 2024-07-27 DIAGNOSIS — R9389 Abnormal findings on diagnostic imaging of other specified body structures: Secondary | ICD-10-CM | POA: Diagnosis not present

## 2024-07-27 NOTE — Patient Instructions (Signed)
"    ICD-10-CM   1. Abnormal CT of the chest  R93.89     2. Ground glass opacity present on imaging of lung  R91.8       - The findings are present on the lower lobe of the lungs and the lung images on the PET scan in December 2025 and it was not that apparent of present on a CT scan of the chest for cardiac reasons in August 2025.  Most likely these findings are because of post viral inflammation [he had symptoms early December 2025]  Plan -Do high-resolution CT chest supine and prone end of April 2026 [89-month follow-up] - To ensure resolution versus persistence   follow-up - 15-minute visit with Dr. Geronimo or nurse practitioner to review CT scan results ;  - April 20 30 Nov 2024 "

## 2024-07-27 NOTE — Progress Notes (Signed)
 "      OV 07/27/2024  Subjective:  Patient ID: Sharon Mcdonald, female , DOB: 07-10-1955 , age 69 y.o. , MRN: [id number] , ADDRESS: [address] [place] [address] PCP Jarold Medici, MD Patient Care Team: Jarold Medici, MD as PCP - General (Internal Medicine) Kate Lonni CROME, MD as PCP - Cardiology (Cardiology) Rudy Dorothyann DASEN, RPH-CPP (Pharmacist)  This Provider for this visit: Treatment Team:  Attending Provider: Mannam, Praveen, MD    07/27/2024 -   Chief Complaint  Patient presents with   Consult    Pt was referred for Abnormal CT of the chest, but stated she isnt havig any SOB or a cough     HPI [name] 69 y.o. -BRIAUNNA GRINDSTAFF is a 69 year old female who presents for evaluation of lung findings noted on imaging. She was referred by Dr. Lonni Bugler for evaluation of lung findings noted on imaging.  During her annual physical, a heart murmur was noted, prompting further investigation. An x-ray was performed, followed by a CT PET scan, after which the patient was told there were some lung findings such as possible scarring or inflammation. A cardiac CT scan in August was normal, but the PET scan in December showed lung findings.  Personally visualized December 2025 and shows bilateral lower lobe ground glass opacities  No respiratory symptoms such as shortness of breath, cough, wheezing, or waking up at night short of breath. She uses one pillow to sleep and has no leg swelling, hemoptysis, weight loss, fever, or chills. She experienced flu-like symptoms in early December, including a cough, but no fever or rhinorrhea. The cough has since resolved.  Her social history includes a smoking history of about ten years, having quit forty years ago. She smoked less than a pack a day. She denies current smoking, acid reflux, vomiting episodes, and has no history of autoimmune problems such as rheumatoid arthritis or lupus.    CT Chest data  from date: * AUG 2025 - CARDIAC CT  - personally visualized and independently interpreted : yes - my findings are: As below although in retrospect mild ground glass opacity in the lower lobe might be emerging DDENDUM REPORT: 03/03/2024 20:53   EXAM: OVER-READ INTERPRETATION  CT CHEST   The following report is an over-read performed by radiologist Dr. Fonda Mom [facility] on 03/03/2024. This over-read does not include interpretation of cardiac or coronary anatomy or pathology. The coronary calcium  score interpretation by the cardiologist is attached.   COMPARISON:  None.   FINDINGS: Cardiovascular: See findings discussed in the body of the report. Calcifications of the aorta and coronary arteries. Calcified mitral valve.   Mediastinum/Nodes: No suspicious adenopathy identified. Imaged mediastinal structures are unremarkable.   Lungs/Pleura: There is dependent basilar subsegmental atelectasis. No pneumonia or pulmonary edema. No pleural effusion or pneumothorax.   Upper Abdomen: No acute abnormality.   Musculoskeletal: No chest wall abnormality. No acute osseous findings.   IMPRESSION: No acute extracardiac incidental findings.     Electronically Signed   By: Fonda Field M.D.   On: 03/03/2024 20:53   DEC 2025  NM PET SCAN   FINDINGS: Mediastinum/Nodes: No solid / cystic mediastinal masses. The visualized esophagus is nondistended precluding optimal assessment. No thoracic lymphadenopathy by size criteria.   Lungs/Pleura: Imaged tracheo-bronchial tree is patent. There are nonspecific ground-glass opacities in the bilateral lower lobe with subpleural sparing. Findings are nonspecific and differential diagnosis includes pulmonary edema and less likely NSIP,  alveolar hemorrhage, etc. Correlate clinically. No mass or consolidation. No pleural effusion or pneumothorax. No suspicious lung nodules.   Upper Abdomen: Visualized upper abdominal  viscera within normal limits.    PFT      No data to display             LAB RESULTS last 96 hours No results found.       has a past medical history of Diabetes mellitus, Diabetic retinopathy (HCC), High cholesterol, and Hypertension.   reports that she quit smoking about 45 years ago. Her smoking use included cigarettes. She has never used smokeless tobacco.  Past Surgical History:  Procedure Laterality Date   BREAST CYST EXCISION Left    BREAST SURGERY     REDUCTION MAMMAPLASTY Bilateral    TONSILLECTOMY     TUBAL LIGATION      Allergies[1]  Immunization History  Administered Date(s) Administered   DTaP 06/03/2017   PFIZER(Purple Top)SARS-COV-2 Vaccination 09/17/2019, 10/12/2019, 04/24/2020   Pfizer Covid-19 Vaccine Bivalent Booster 32yrs & up 07/03/2021   Pfizer(Comirnaty)Fall Seasonal Vaccine 12 years and older 06/02/2023   Tdap 06/03/2017   Unspecified SARS-COV-2 Vaccination 04/01/2022    Family History  Problem Relation Age of Onset   Diabetes Mother    Hypertension Mother    Obesity Mother    Diabetes Brother     Current Medications[2]      Objective:   Vitals:   07/27/24 1513  BP: 110/89  Pulse: 85  Temp: 98.3 F (36.8 C)  TempSrc: Oral  SpO2: 98%  Weight: 157 lb 3.2 oz (71.3 kg)  Height: 5' 1 (1.549 m)    Estimated body mass index is 29.7 kg/m as calculated from the following:   Height as of this encounter: 5' 1 (1.549 m).   Weight as of this encounter: 157 lb 3.2 oz (71.3 kg).  @WEIGHTCHANGE @  [employer]   07/27/24 1513  Weight: 157 lb 3.2 oz (71.3 kg)     Physical Exam   General: No distress. Looks well O2 at rest: no Cane present: no Sitting in wheel chair: no Frail: no Obese: no Neuro: Alert and Oriented x 3. GCS 15. Speech normal Psych: Pleasant Resp:  Barrel Chest - no.  Wheeze - no, Crackles - no, No overt respiratory distress CVS: Normal heart sounds. Murmurs - no Ext: Stigmata of Connective  Tissue Disease - no HEENT: Normal upper airway. PEERL +. No post nasal drip        Assessment/     Assessment & Plan Abnormal CT of the chest  Ground glass opacity present on imaging of lung   Pulmonary inflammation, likely post-infectious Pulmonary inflammation on cardiac CT likely post-infectious. Differential includes interstitial lung disease, but less likely. No current symptoms. No lung cancer risk factors. - Order follow-up CT scan focusing on lungs in April or May. - Advised to report new symptoms such as shortness of breath, chest pain, or cough. PLAN Patient Instructions     ICD-10-CM   1. Abnormal CT of the chest  R93.89     2. Ground glass opacity present on imaging of lung  R91.8       - The findings are present on the lower lobe of the lungs and the lung images on the PET scan in December 2025 and it was not that apparent of present on a CT scan of the chest for cardiac reasons in August 2025.  Most likely these findings are because of post viral inflammation [he had  symptoms early December 2025]  Plan -Do high-resolution CT chest supine and prone end of April 2026 [51-month follow-up] - To ensure resolution versus persistence   follow-up - 15-minute visit with Dr. Geronimo or nurse practitioner to review CT scan results ;  - April 20 30 Nov 2024    FOLLOWUP    Return in about 14 weeks (around 11/02/2024) for with Dr Geronimo, Face to Face Visit.    SIGNATURE    Dr. Dorethia Geronimo, M.D., F.C.C.P,  Pulmonary and Critical Care Medicine Staff Physician, [facility] Center Director - Interstitial Lung Disease  Program  Pulmonary Fibrosis [facility] at [facility] [place], [place], [address]  Pager: [phone], If no answer or between  15:00h - 7:00h: call [phone] Telephone: [phone]  4:04 PM 07/27/2024     [1] No Known Allergies [2]  Current Outpatient Medications:    atorvastatin  (LIPITOR) 40 MG  tablet, Take 1 tablet (40 mg total) by mouth daily., Disp: 90 tablet, Rfl: 2   BIOTIN PO, Take by mouth., Disp: , Rfl:    Continuous Glucose Sensor (FREESTYLE LIBRE 3 PLUS SENSOR) MISC, Change sensor every 15 days., Disp: 2 each, Rfl: 3   dorzolamide-timolol (COSOPT) 2-0.5 % ophthalmic solution, Place 1 drop into both eyes 2 (two) times daily., Disp: , Rfl:    gabapentin  (NEURONTIN ) 300 MG capsule, TAKE 1 CAPSULE BY MOUTH EVERY DAY, Disp: 90 capsule, Rfl: 1   insulin  degludec (TRESIBA  FLEXTOUCH) 100 UNIT/ML FlexTouch Pen, INJECT 20 UNITS INTO THE SKIN DAILY, Disp: 15 mL, Rfl: 2   Insulin  Pen Needle (BD PEN NEEDLE NANO 2ND GEN) 32G X 4 MM MISC, Use with Tresiba  nightly, Disp: 100 each, Rfl: 3   lisinopril  (ZESTRIL ) 40 MG tablet, Take 1 tablet (40 mg total) by mouth daily., Disp: 90 tablet, Rfl: 3   Multiple Vitamin (MULTIVITAMIN) tablet, Take 1 tablet by mouth daily., Disp: , Rfl:    Omega-3 Fatty Acids (FISH OIL PO), Take by mouth., Disp: , Rfl:    tirzepatide  (MOUNJARO ) 12.5 MG/0.5ML Pen, INJECT 12.5 MG SUBCUTANEOUSLY ONE TIME PER WEEK, Disp: 2 mL, Rfl: 1   aspirin  EC 81 MG tablet, Take 1 tablet (81 mg total) by mouth daily. Swallow whole. (Patient not taking: Reported on 07/27/2024), Disp: , Rfl:    Baloxavir Marboxil ,40 MG Dose, (XOFLUZA , 40 MG DOSE,) 1 x 40 MG TBPK, One tab po qd (Patient not taking: Reported on 07/27/2024), Disp: 1 each, Rfl: 0  "

## 2024-07-30 ENCOUNTER — Telehealth (HOSPITAL_COMMUNITY): Payer: Self-pay | Admitting: Cardiology

## 2024-07-30 ENCOUNTER — Telehealth: Payer: Self-pay | Admitting: Cardiology

## 2024-07-30 NOTE — Telephone Encounter (Signed)
 Patient cancelled echocardiogram for reason below:  07/30/2024 1:17 PM By: WILSON, JASMIN B  Cancel Rsn: Patient (stated she did not need this test)  Order will be removed from the echo WQ.

## 2024-07-30 NOTE — Telephone Encounter (Signed)
 Spoke with patient regarding echo. Pt states she cancelled her echo because her pulmonologist was unable to find anything and didn't think she needed this test.  Per Dr Kate and last conversation Frankey, RN had with patient: Echocardiogram is for heart murmur and had valvular calcifications on her calcium  score. We cannot evaluate her heart valves with PET scan, needs echo  Discussed this with patient and pt willing to proceed with echo. Will send to [place] [place] to get this rescheduled. Pt verbalizes understanding of plan.

## 2024-07-30 NOTE — Telephone Encounter (Signed)
 Patient called and canceled her Echocardiogram stating she did not understand the need for this test.

## 2024-08-05 ENCOUNTER — Telehealth: Payer: Self-pay | Admitting: Pharmacist

## 2024-08-05 DIAGNOSIS — E1165 Type 2 diabetes mellitus with hyperglycemia: Secondary | ICD-10-CM

## 2024-08-05 MED ORDER — BLOOD GLUCOSE TEST VI STRP: ORAL_STRIP | 3 refills | Status: AC

## 2024-08-05 MED ORDER — LANCETS MISC: 3 refills | Status: AC

## 2024-08-05 MED ORDER — BLOOD GLUCOSE MONITORING SUPPL DEVI: 0 refills | Status: AC

## 2024-08-05 MED ORDER — LANCET DEVICE MISC: 0 refills | Status: AC

## 2024-08-05 NOTE — Progress Notes (Unsigned)
 "  08/06/2024 Name: Sharon Mcdonald MRN: [id number] DOB: 1956/04/03  Chief Complaint  Patient presents with   Medication Management    Diabetes    Sharon Mcdonald is a 68 y.o. year old female who presented for a telephone visit.   They were referred to the pharmacist by their PCP for assistance in managing diabetes. Purpose of today's call was to follow up.     Subjective: Sharon Mcdonald is a 69 year old female with multiple medical conditions including but not limited to:  type 2 diabetes, hypertension, hyperlipidemia, heart murmur, and coronary artery calcification.  Care Team: Primary Care Provider: Jarold Medici, MD ; Next Scheduled Visit: 08/26/2023   Medication Access/Adherence  Current Pharmacy:  [facility] [phone] [address] [place] - [address] [place] [address] Phone: [phone] Fax: [phone]  [facility] - [place], [place] - [address] [place] [address] Phone: [phone] Fax: [phone]   Patient reports affordability concerns with their medications: No  Patient reports access/transportation concerns to their pharmacy: No  Patient reports adherence concerns with their medications:  No      Diabetes:  Current medications:   Mounjaro  12.5 mg wekly Tresiba  20 units daily    Patient denies hypoglycemic s/sx including  dizziness, shakiness, sweating. Patient denies hyperglycemic symptoms including  polyuria, polydipsia, polyphagia, nocturia, neuropathy, blurred vision.   She has access to [employer] sensors but does not always use them.  She would like to use a blood glucose meter but needs new supplies.   Macrovascular and Microvascular Risk Reduction:  Statin? yes (atorvastatin  06/22/2024); ACEi/ARB? yes (lisinopril  40 mg lf 06/08/2024)   Last urinary albumin/creatinine ratio:  Lab Results  Component Value Date   MICRALBCREAT 16 05/25/2024   MICRALBCREAT  32 (H) 02/18/2024   MICRALBCREAT 22 12/04/2023   MICRALBCREAT 26 09/23/2022   MICRALBCREAT 23 08/15/2021   MICRALBCREAT 300 11/27/2020   MICRALBCREAT <30 07/27/2020   MICRALBCREAT <30 01/04/2020   MICRALBCREAT 30 06/30/2019   MICRALBCREAT 30-300 06/24/2018   Last eye exam:  Lab Results  Component Value Date   HMDIABEYEEXA Retinopathy (A) 03/22/2024   Last foot exam: 03/29/2022 Tobacco Use:  Tobacco Use: Medium Risk (07/27/2024)   Patient History    Smoking Tobacco Use: Former    Smokeless Tobacco Use: Never    Passive Exposure: Not on file     Objective:  Lab Results  Component Value Date   HGBA1C 7.3 (H) 05/25/2024    Lab Results  Component Value Date   CREATININE 0.64 05/25/2024   BUN 9 05/25/2024   NA 144 05/25/2024   K 3.9 05/25/2024   CL 106 05/25/2024   CO2 23 05/25/2024    Lab Results  Component Value Date   CHOL 138 02/18/2024   HDL 52 02/18/2024   LDLCALC 72 02/18/2024   TRIG 69 02/18/2024   CHOLHDL 2.7 02/18/2024    Medications Reviewed Today     Reviewed by Jolee Cassius PARAS, RPH (Pharmacist) on 08/05/24 at 1610  Med List Status: <None>   Medication Order Taking? Sig Documenting Provider Last Dose Status Informant  aspirin  EC 81 MG tablet [id number] Yes Take 1 tablet (81 mg total) by mouth daily. Swallow whole. Jarold Medici, MD  Active   atorvastatin  (LIPITOR) 40 MG tablet [id number] Yes Take 1 tablet (40 mg total) by mouth daily. Kate Lonni CROME, MD  Active   Baloxavir Marboxil ,40 MG Dose, (XOFLUZA , 40 MG DOSE,)  1 x 40 MG TBPK [id number]  One tab po qd  Patient not taking: Reported on 07/27/2024   Jarold Medici, MD  Active   BIOTIN PO [id number] Yes Take by mouth. [provider]  Active   Continuous Glucose Sensor (FREESTYLE LIBRE 3 PLUS SENSOR) MISC [id number] Yes Change sensor every 15 days. Jarold Medici, MD  Active   dorzolamide-timolol (COSOPT) 2-0.5 % ophthalmic solution [id number] Yes Place 1 drop into both eyes 2 (two)  times daily. [provider]  Active   gabapentin  (NEURONTIN ) 300 MG capsule [id number] Yes TAKE 1 CAPSULE BY MOUTH EVERY DAY Jarold Medici, MD  Active   insulin  degludec (TRESIBA  FLEXTOUCH) 100 UNIT/ML FlexTouch Pen [id number] Yes INJECT 20 UNITS INTO THE SKIN DAILY  Patient taking differently: Inject 22 Units into the skin daily.   Jarold Medici, MD  Active   Insulin  Pen Needle (BD PEN NEEDLE NANO 2ND GEN) 32G X 4 MM MISC [id number] Yes Use with Tresiba  nightly Jarold Medici, MD  Active   lisinopril  (ZESTRIL ) 40 MG tablet [id number] Yes Take 1 tablet (40 mg total) by mouth daily. Jarold Medici, MD  Active   meloxicam  (MOBIC ) 15 MG tablet [id number] Yes Take 15 mg by mouth daily as needed. [provider]  Active   Multiple Vitamin (MULTIVITAMIN) tablet [id number] Yes Take 1 tablet by mouth daily. [provider]  Active   Omega-3 Fatty Acids (FISH OIL PO) [id number] Yes Take by mouth. [provider]  Active   tirzepatide  (MOUNJARO ) 12.5 MG/0.5ML Pen [id number] Yes INJECT 12.5 MG SUBCUTANEOUSLY ONE TIME PER WEEK Jarold Medici, MD  Active               07/27/2024    3:13 PM 06/29/2024    9:42 AM 06/29/2024    9:41 AM  Vitals with BMI  Height 5' 1    Weight 157 lbs 3 oz    BMI 29.72    Systolic 110 129 860  Diastolic 89 78 79  Pulse 85      Assessment/Plan:   Diabetes: - Currently uncontrolled; goal A1c <7%. Cardiorenal risk reduction has opportunities for improvement.. Blood pressure is at goal <130/80. LDL is at goal.  - Continue current therapy.  Follow Up Plan:    Send new Prescription for testing supplies. Follow up with Patient in 2 weeks.  Cassius DOROTHA Brought, PharmD, BCACP Clinical Pharmacist [phone]     "

## 2024-08-13 ENCOUNTER — Ambulatory Visit (HOSPITAL_COMMUNITY)

## 2024-08-25 ENCOUNTER — Ambulatory Visit: Admitting: Internal Medicine

## 2024-09-10 ENCOUNTER — Ambulatory Visit (HOSPITAL_COMMUNITY)

## 2024-09-17 ENCOUNTER — Ambulatory Visit (HOSPITAL_COMMUNITY)

## 2024-11-01 ENCOUNTER — Ambulatory Visit: Admitting: Podiatry

## 2024-11-05 ENCOUNTER — Other Ambulatory Visit

## 2024-11-10 ENCOUNTER — Ambulatory Visit: Admitting: Podiatry

## 2025-02-21 ENCOUNTER — Encounter: Payer: Self-pay | Admitting: Internal Medicine
